# Patient Record
Sex: Female | Born: 1985 | Race: Black or African American | Hispanic: No | Marital: Single | Smoking: Former smoker
Health system: Southern US, Community
[De-identification: ages and names within clinical notes are randomized; demographics above are authoritative.]

## PROBLEM LIST (undated history)

## (undated) ENCOUNTER — Inpatient Hospital Stay (HOSPITAL_COMMUNITY): Payer: Self-pay

## (undated) DIAGNOSIS — F329 Major depressive disorder, single episode, unspecified: Secondary | ICD-10-CM

## (undated) DIAGNOSIS — R011 Cardiac murmur, unspecified: Secondary | ICD-10-CM

## (undated) DIAGNOSIS — D649 Anemia, unspecified: Secondary | ICD-10-CM

## (undated) DIAGNOSIS — A549 Gonococcal infection, unspecified: Secondary | ICD-10-CM

## (undated) DIAGNOSIS — J189 Pneumonia, unspecified organism: Secondary | ICD-10-CM

## (undated) DIAGNOSIS — N390 Urinary tract infection, site not specified: Secondary | ICD-10-CM

## (undated) DIAGNOSIS — O039 Complete or unspecified spontaneous abortion without complication: Secondary | ICD-10-CM

## (undated) DIAGNOSIS — F32A Depression, unspecified: Secondary | ICD-10-CM

---
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
## 2011-07-12 ENCOUNTER — Emergency Department (HOSPITAL_COMMUNITY)
Admission: EM | Admit: 2011-07-12 | Discharge: 2011-07-12 | Disposition: A | Discharge status: Home Health | Payer: Medicaid Other | Attending: Emergency Medicine | Admitting: Emergency Medicine

## 2011-07-12 ENCOUNTER — Encounter (HOSPITAL_COMMUNITY): Payer: Self-pay | Admitting: *Deleted

## 2011-07-12 DIAGNOSIS — R3915 Urgency of urination: Secondary | ICD-10-CM | POA: Insufficient documentation

## 2011-07-12 DIAGNOSIS — R32 Unspecified urinary incontinence: Secondary | ICD-10-CM | POA: Insufficient documentation

## 2011-07-12 DIAGNOSIS — R109 Unspecified abdominal pain: Secondary | ICD-10-CM | POA: Insufficient documentation

## 2011-07-12 DIAGNOSIS — N898 Other specified noninflammatory disorders of vagina: Secondary | ICD-10-CM | POA: Insufficient documentation

## 2011-07-12 DIAGNOSIS — L293 Anogenital pruritus, unspecified: Secondary | ICD-10-CM | POA: Insufficient documentation

## 2011-07-12 DIAGNOSIS — N390 Urinary tract infection, site not specified: Secondary | ICD-10-CM | POA: Insufficient documentation

## 2011-07-12 DIAGNOSIS — R300 Dysuria: Secondary | ICD-10-CM | POA: Insufficient documentation

## 2011-07-12 LAB — URINE MICROSCOPIC-ADD ON

## 2011-07-12 LAB — URINALYSIS, ROUTINE W REFLEX MICROSCOPIC
Glucose, UA: NEGATIVE mg/dL
Hgb urine dipstick: NEGATIVE
Specific Gravity, Urine: 1.028 (ref 1.005–1.030)
Urobilinogen, UA: 1 mg/dL (ref 0.0–1.0)

## 2011-07-12 LAB — WET PREP, GENITAL
Trich, Wet Prep: NONE SEEN
Yeast Wet Prep HPF POC: NONE SEEN

## 2011-07-12 MED ORDER — SULFAMETHOXAZOLE-TRIMETHOPRIM 800-160 MG PO TABS: 1.0000 | ORAL_TABLET | Freq: Two times a day (BID) | ORAL | Status: AC

## 2011-07-12 MED ORDER — HYDROCODONE-ACETAMINOPHEN 5-325 MG PO TABS: 1.0000 | ORAL_TABLET | ORAL | Status: AC | PRN

## 2011-07-12 NOTE — ED Provider Notes (Signed)
History     CSN: [id number]  Arrival date & time 07/12/11  1701   First MD Initiated Contact with Patient 07/12/11 1818      Chief Complaint  Patient presents with  . Vaginal Pain  . Urinary Tract Infection    (Consider location/radiation/quality/duration/timing/severity/associated sxs/prior treatment) HPI History provided by pt.   Pt has had constant, achy, mid-line lower abd pain x 4 days.  No modifying factors.  Pain feels like UTIs she has had in the past.  Associated w/ dysuria and urgency w/ incontinence as well as vulvar pruritis and vaginal discharge.  Denies fever, N/V/D and no other GU sx.  Started her period 2 days ago.  No prior h/o menstrual cramps.  No h/o abd surgeries.    History reviewed. No pertinent past medical history.  History reviewed. No pertinent past surgical history.  History reviewed. No pertinent family history.  History  Substance Use Topics  . Smoking status: Not on file  . Smokeless tobacco: Not on file  . Alcohol Use: Not on file    OB History    Grav Para Term Preterm Abortions TAB SAB Ect Mult Living                  Review of Systems  All other systems reviewed and are negative.    Allergies  Review of patient's allergies indicates no known allergies.  Home Medications  No current outpatient prescriptions on file.  BP 128/74  Pulse 79  Temp(Src) 97.5 F (36.4 C) (Oral)  Resp 20  SpO2 99%  Physical Exam  Nursing note and vitals reviewed. Constitutional: She is oriented to person, place, and time. She appears well-developed and well-nourished. No distress.       Pt does not appear uncomfortable  HENT:  Head: Normocephalic and atraumatic.  Eyes:       Normal appearance  Neck: Normal range of motion.  Cardiovascular: Normal rate and regular rhythm.   Pulmonary/Chest: Breath sounds normal.  Abdominal: Soft. Bowel sounds are normal. She exhibits no distension and no mass. There is no tenderness. There is no rebound and  no guarding.  Genitourinary: Pelvic exam was performed with patient supine. There is no rash or tenderness on the right labia. There is no rash or tenderness on the left labia. Cervix exhibits no motion tenderness and no friability. Right adnexum displays no tenderness. Left adnexum displays no tenderness. There is bleeding around the vagina. No vaginal discharge found.       Cervical os closed and appears normal.   Neurological: She is alert and oriented to person, place, and time.  Skin: Skin is warm and dry. No rash noted.  Psychiatric: She has a normal mood and affect. Her behavior is normal.    ED Course  Procedures (including critical care time)  Labs Reviewed  URINALYSIS, ROUTINE W REFLEX MICROSCOPIC - Abnormal; Notable for the following:    Leukocytes, UA SMALL (*)    All other components within normal limits  WET PREP, GENITAL - Abnormal; Notable for the following:    WBC, Wet Prep HPF POC RARE (*)    All other components within normal limits  URINE MICROSCOPIC-ADD ON - Abnormal; Notable for the following:    Squamous Epithelial / LPF FEW (*)    All other components within normal limits  POCT PREGNANCY, URINE  GC/CHLAMYDIA PROBE AMP, GENITAL   No results found.   1. Urinary tract infection       MDM  Healthy  26yo F presents w/ c/o lower abdominal pain + urinary sx and vaginal discharge.  Has her period currently but no h/o menstrual cramps.  Abd benign and non-tender and nml genitalia.  Wet prep unremarkable and U/A shows possible UTI.  Will treat w/ 3 day course of bactrim and advise pt to f/u with her gynecologist if sx have not started to improve the by the time she completes abx.  Return precautions discussed.         Otilio Miu, [place] 07/12/11 2012

## 2011-07-12 NOTE — ED Notes (Signed)
Pt in c/o burning to vaginal area and burning during urination, states she is currently on her period but is concerned about an odor, c/o odor to urine also

## 2011-07-12 NOTE — Discharge Instructions (Signed)
Take vicodin as prescribed for severe pain.   Do not drive within four hours of taking this medication (may cause drowsiness or confusion).  Take antibiotic as prescribed.  You can buy azo standard over the counter for burning when you urinate.  Follow up with your gynecologist, particularly if the pain has not started to improve by the time you complete your antibiotic.  If you do not have a gynecologist, you can call [facility] [phone]; [address])  You should return to the ER if your pain worsens or you develop associated fever.  Urinary Tract Infection Infections of the urinary tract can start in several places. A bladder infection (cystitis), a kidney infection (pyelonephritis), and a prostate infection (prostatitis) are different types of urinary tract infections (UTIs). They usually get better if treated with medicines (antibiotics) that kill germs. Take all the medicine until it is gone. You or your child may feel better in a few days, but TAKE ALL MEDICINE or the infection may not respond and may become more difficult to treat. HOME CARE INSTRUCTIONS   Drink enough water and fluids to keep the urine clear or pale yellow. Cranberry juice is especially recommended, in addition to large amounts of water.   Avoid caffeine, tea, and carbonated beverages. They tend to irritate the bladder.   Alcohol may irritate the prostate.   Only take over-the-counter or prescription medicines for pain, discomfort, or fever as directed by your caregiver.  To prevent further infections:  Empty the bladder often. Avoid holding urine for long periods of time.   After a bowel movement, women should cleanse from front to back. Use each tissue only once.   Empty the bladder before and after sexual intercourse.  FINDING OUT THE RESULTS OF YOUR TEST Not all test results are available during your visit. If your or your child's test results are not back during the visit, make an appointment  with your caregiver to find out the results. Do not assume everything is normal if you have not heard from your caregiver or the medical facility. It is important for you to follow up on all test results. SEEK MEDICAL CARE IF:   There is back pain.   Your baby is older than 3 months with a rectal temperature of 100.5 F (38.1 C) or higher for more than 1 day.   Your or your child's problems (symptoms) are no better in 3 days. Return sooner if you or your child is getting worse.  SEEK IMMEDIATE MEDICAL CARE IF:   There is severe back pain or lower abdominal pain.   You or your child develops chills.   You have a fever.   Your baby is older than 3 months with a rectal temperature of 102 F (38.9 C) or higher.   Your baby is 43 months old or younger with a rectal temperature of 100.4 F (38 C) or higher.   There is nausea or vomiting.   There is continued burning or discomfort with urination.  MAKE SURE YOU:   Understand these instructions.   Will watch your condition.   Will get help right away if you are not doing well or get worse.  Document Released: 02/13/2005 Document Revised: 01/16/2011 Document Reviewed: 09/18/2006 [facility] Patient Information 2012 [place], [place].

## 2011-07-13 LAB — GC/CHLAMYDIA PROBE AMP, GENITAL: GC Probe Amp, Genital: NEGATIVE

## 2011-07-13 NOTE — ED Provider Notes (Signed)
Medical screening examination/treatment/procedure(s) were performed by non-physician practitioner and as supervising physician I was immediately available for consultation/collaboration.  Flint Melter, MD 07/13/11 [phone]

## 2011-08-20 ENCOUNTER — Emergency Department (HOSPITAL_COMMUNITY): Payer: Medicaid Other

## 2011-08-20 ENCOUNTER — Encounter (HOSPITAL_COMMUNITY): Payer: Self-pay | Admitting: Emergency Medicine

## 2011-08-20 ENCOUNTER — Emergency Department (HOSPITAL_COMMUNITY)
Admission: EM | Admit: 2011-08-20 | Discharge: 2011-08-20 | Disposition: A | Discharge status: Home / Self Care | Payer: Medicaid Other | Attending: Emergency Medicine | Admitting: Emergency Medicine

## 2011-08-20 DIAGNOSIS — O99891 Other specified diseases and conditions complicating pregnancy: Secondary | ICD-10-CM | POA: Insufficient documentation

## 2011-08-20 DIAGNOSIS — R109 Unspecified abdominal pain: Secondary | ICD-10-CM | POA: Insufficient documentation

## 2011-08-20 DIAGNOSIS — M545 Low back pain, unspecified: Secondary | ICD-10-CM | POA: Insufficient documentation

## 2011-08-20 MED ORDER — ACETAMINOPHEN 325 MG PO TABS
650.0000 mg | ORAL_TABLET | Freq: Once | ORAL | Status: AC
  Administered 2011-08-20: 650 mg via ORAL
  Filled 2011-08-20: qty 2
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
## 2011-08-21 NOTE — ED Provider Notes (Signed)
Medical screening examination/treatment/procedure(s) were performed by non-physician practitioner and as supervising physician I was immediately available for consultation/collaboration.   Chrystina Naff, MD 08/21/11 2103 

## 2012-01-30 ENCOUNTER — Encounter (HOSPITAL_COMMUNITY): Payer: Self-pay

## 2012-01-30 ENCOUNTER — Inpatient Hospital Stay (HOSPITAL_COMMUNITY)
Admission: AD | Admit: 2012-01-30 | Discharge: 2012-01-30 | Disposition: A | Discharge status: Home / Self Care | Payer: Medicaid Other | Source: Ambulatory Visit | Attending: Obstetrics & Gynecology | Admitting: Obstetrics & Gynecology

## 2012-01-30 DIAGNOSIS — J069 Acute upper respiratory infection, unspecified: Secondary | ICD-10-CM

## 2012-01-30 DIAGNOSIS — O0930 Supervision of pregnancy with insufficient antenatal care, unspecified trimester: Secondary | ICD-10-CM | POA: Insufficient documentation

## 2012-01-30 DIAGNOSIS — O99891 Other specified diseases and conditions complicating pregnancy: Secondary | ICD-10-CM | POA: Insufficient documentation

## 2012-01-30 DIAGNOSIS — J00 Acute nasopharyngitis [common cold]: Secondary | ICD-10-CM | POA: Insufficient documentation

## 2012-01-30 DIAGNOSIS — B373 Candidiasis of vulva and vagina: Secondary | ICD-10-CM

## 2012-01-30 HISTORY — DX: Cardiac murmur, unspecified: R01.1

## 2012-01-30 HISTORY — DX: Anemia, unspecified: D64.9

## 2012-01-30 LAB — WET PREP, GENITAL
Clue Cells Wet Prep HPF POC: NONE SEEN
Trich, Wet Prep: NONE SEEN

## 2012-01-30 MED ORDER — PSEUDOEPHEDRINE HCL 30 MG PO TABS
60.0000 mg | ORAL_TABLET | Freq: Once | ORAL | Status: AC
  Administered 2012-01-30: 60 mg via ORAL
  Filled 2012-01-30: qty 2

## 2012-01-30 MED ORDER — PSEUDOEPHEDRINE HCL 30 MG PO TABS
30.0000 mg | ORAL_TABLET | Freq: Once | ORAL | Status: AC
  Administered 2012-01-30: 30 mg via ORAL
  Filled 2012-01-30: qty 1

## 2012-01-30 MED ORDER — FLUCONAZOLE 150 MG PO TABS
150.0000 mg | ORAL_TABLET | Freq: Once | ORAL | Status: AC
  Administered 2012-01-30: 150 mg via ORAL
  Filled 2012-01-30: qty 1

## 2012-01-30 MED ORDER — FLUCONAZOLE 150 MG PO TABS: 150.0000 mg | ORAL_TABLET | Freq: Once | ORAL | Status: DC

## 2012-01-30 MED ORDER — FLUCONAZOLE 150 MG PO TABS: 150.0000 mg | ORAL_TABLET | Freq: Once | ORAL | Status: AC

## 2012-01-30 NOTE — MAU Provider Note (Signed)
  History     CSN: [id number]  Arrival date and time: 01/30/12 1722   First Provider Initiated Contact with Patient 01/30/12 1858      Chief Complaint  Patient presents with  . URI  . Vaginal Discharge  . Abdominal Pain   HPI This is a 26 y.o. female at [redacted]w[redacted]d who presents with c/o cold symptoms, dizziness, and cough which gives her substernal pain at times. Duration 3 days. Cough is productive in mornings. No fever. No leaking or bleeding. C/O vaginal itching and occ. Odor. Gets care in [place] but lives here now and may transfer. No care in past month OB History    Grav Para Term Preterm Abortions TAB SAB Ect Mult Living   6 5 4 1      5       Past Medical History  Diagnosis Date  . Heart murmur   . Anemia     Past Surgical History  Procedure Date  . Cesarean section     Family History  Problem Relation Age of Onset  . Other Neg Hx     History  Substance Use Topics  . Smoking status: Former Smoker    Quit date: 12/30/2011  . Smokeless tobacco: Not on file  . Alcohol Use: No     smokes a joint occassionally    Allergies: No Known Allergies  No prescriptions prior to admission    ROS As in HPI  Physical Exam   Blood pressure 120/67, pulse 94, temperature 98.4 F (36.9 C), temperature source Oral, resp. rate 16, height 5\' 8"  (1.727 m), last menstrual period 07/09/2011, SpO2 100.00%.  Physical Exam  Constitutional: She is oriented to person, place, and time. She appears well-developed and well-nourished. No distress.  HENT:  Head: Normocephalic.  Right Ear: External ear normal.  Left Ear: External ear normal.  Mouth/Throat: Oropharynx is clear and moist. No oropharyngeal exudate.  Cardiovascular: Normal rate.   Respiratory: Effort normal and breath sounds normal. No respiratory distress. She has no wheezes. She has no rales. She exhibits no tenderness.  GI: Soft. There is no tenderness.  Genitourinary: Uterus normal. Vaginal discharge (small  white) found.  Musculoskeletal: Normal range of motion.  Neurological: She is alert and oriented to person, place, and time.  Skin: Skin is warm and dry.  Psychiatric: She has a normal mood and affect.  FHR reassuring No contractions  MAU Course  Procedures   Assessment and Plan  A:  SIUP at [redacted]w[redacted]d      Common cold with productive cough      No fever      Limited prenatal care  P:  Sudafed and mucinex (will give one dose here)      Supportive care      Wet prep pending   [facility] [place] 01/30/2012, 7:20 PM

## 2012-01-30 NOTE — MAU Note (Signed)
Patient states she gets her prenatal care at [facility]. Has been having symptoms of a cold for about 3 days. Nasal congestion, coughing and chest tightening when walking. Denies any bleeding or leaking. Reports good fetal movement.

## 2012-01-30 NOTE — MAU Provider Note (Signed)
Attestation of Attending Supervision of Advanced Practitioner (CNM/NP): Evaluation and management procedures were performed by the Advanced Practitioner under my supervision and collaboration.  I have reviewed the Advanced Practitioner's note and chart, and I agree with the management and plan.  Darrius Montano, MD, FACOG Attending Obstetrician & Gynecologist Faculty Practice, [facility]  

## 2012-02-17 ENCOUNTER — Encounter (HOSPITAL_COMMUNITY): Payer: Self-pay | Admitting: *Deleted

## 2012-02-17 ENCOUNTER — Inpatient Hospital Stay (HOSPITAL_COMMUNITY)
Admission: AD | Admit: 2012-02-17 | Discharge: 2012-02-17 | Disposition: A | Discharge status: Home / Self Care | Payer: Medicaid Other | Source: Ambulatory Visit | Attending: Obstetrics & Gynecology | Admitting: Obstetrics & Gynecology

## 2012-02-17 DIAGNOSIS — E860 Dehydration: Secondary | ICD-10-CM

## 2012-02-17 DIAGNOSIS — O479 False labor, unspecified: Secondary | ICD-10-CM

## 2012-02-17 DIAGNOSIS — R0602 Shortness of breath: Secondary | ICD-10-CM | POA: Insufficient documentation

## 2012-02-17 DIAGNOSIS — R109 Unspecified abdominal pain: Secondary | ICD-10-CM | POA: Insufficient documentation

## 2012-02-17 DIAGNOSIS — O4700 False labor before 37 completed weeks of gestation, unspecified trimester: Secondary | ICD-10-CM | POA: Insufficient documentation

## 2012-02-17 DIAGNOSIS — O99891 Other specified diseases and conditions complicating pregnancy: Secondary | ICD-10-CM | POA: Insufficient documentation

## 2012-02-17 DIAGNOSIS — R002 Palpitations: Secondary | ICD-10-CM | POA: Insufficient documentation

## 2012-02-17 HISTORY — DX: Urinary tract infection, site not specified: N39.0

## 2012-02-17 HISTORY — DX: Gonococcal infection, unspecified: A54.9

## 2012-02-17 HISTORY — DX: Major depressive disorder, single episode, unspecified: F32.9

## 2012-02-17 HISTORY — DX: Depression, unspecified: F32.A

## 2012-02-17 LAB — URINALYSIS, ROUTINE W REFLEX MICROSCOPIC
Hgb urine dipstick: NEGATIVE
Ketones, ur: 40 mg/dL — AB
Nitrite: NEGATIVE
Protein, ur: 30 mg/dL — AB
Specific Gravity, Urine: >1.03 — ABNORMAL HIGH (ref 1.005–1.030)
Urobilinogen, UA: 1 mg/dL (ref 0.0–1.0)

## 2012-02-17 LAB — URINE MICROSCOPIC-ADD ON

## 2012-02-17 MED ORDER — SODIUM CHLORIDE 0.9 % IV BOLUS (SEPSIS)
1000.0000 mL | Freq: Once | INTRAVENOUS | Status: AC
  Administered 2012-02-17: 1000 mL via INTRAVENOUS

## 2012-02-17 MED ORDER — ACETAMINOPHEN 325 MG PO TABS
650.0000 mg | ORAL_TABLET | ORAL | Status: AC
  Administered 2012-02-17: 650 mg via ORAL
  Filled 2012-02-17: qty 2

## 2012-02-17 MED ORDER — DEXTROSE 5 % IN LACTATED RINGERS IV BOLUS
1000.0000 mL | Freq: Once | INTRAVENOUS | Status: AC
  Administered 2012-02-17: 1000 mL via INTRAVENOUS

## 2012-02-17 NOTE — MAU Provider Note (Signed)
History     CSN: [id number]  Arrival date and time: 02/17/12 5284   First Provider Initiated Contact with Patient 02/17/12 1908      Chief Complaint  Patient presents with  . Abdominal Cramping  . R/O contractions    HPI Comments: 26 yo [id number] @ 31.6 weeks with history of 4 C/S's and no prenatal care presents with heart palpitations, SOB, dizziness, and abdominal tightening.  Patient reports that for the past 2 weeks she has felt like her heart is racin/gpounding, especially when going from sitting to standing.  This is associated with shortness of breath and some dizziness.  No history of cardiac or pulmonary problems.  Has also been having some lower abdominal tightening and feels like her daughter is trying to kick her water out.  Some vaginal pressure but feels like it is more associated with fetal movement.  Having occasional ctx (whole stomach tightening) ~4x/day.  Has been unable to get any [facility] b/c she went to [facility] for her last pregnancies but is unable to get to [facility] b/c she is without a vehicle currently.  Denies LOF or vaginal bleeding. +FM.   Abdominal Cramping Associated symptoms include nausea. Pertinent negatives include no dysuria, fever, headaches or vomiting.    OB History    Grav Para Term Preterm Abortions TAB SAB Ect Mult Living   6 5 4 1      5       Past Medical History  Diagnosis Date  . Heart murmur   . Anemia   . Urinary tract infection   . Depression   . Gonorrhea     Past Surgical History  Procedure Date  . Cesarean section     X 4    Family History  Problem Relation Age of Onset  . Other Neg Hx     History  Substance Use Topics  . Smoking status: Former Smoker    Quit date: 12/30/2011  . Smokeless tobacco: Never Used  . Alcohol Use: No     smokes a joint occassionally    Allergies: No Known Allergies  No prescriptions prior to admission    Review of Systems  Constitutional: Negative for fever and chills.  Eyes: Negative  for blurred vision.  Respiratory: Positive for shortness of breath. Negative for cough and wheezing.   Cardiovascular: Negative for chest pain.  Gastrointestinal: Positive for nausea and abdominal pain. Negative for vomiting.  Genitourinary: Negative for dysuria.  Musculoskeletal: Negative for falls.  Neurological: Positive for dizziness. Negative for loss of consciousness and headaches.   Physical Exam   Blood pressure 123/69, pulse 114, temperature 98.1 F (36.7 C), temperature source Oral, resp. rate 18, height 5\' 8"  (1.727 m), weight 122.925 kg (271 lb), last menstrual period 07/09/2011, SpO2 99.00%.  Physical Exam  Constitutional: She is oriented to person, place, and time. She appears well-developed and well-nourished. No distress.  HENT:  Head: Normocephalic and atraumatic.  Mouth/Throat: Mucous membranes are dry.  Eyes: Conjunctivae normal are normal. Pupils are equal, round, and reactive to light.  Cardiovascular: Regular rhythm, normal heart sounds and intact distal pulses.  Tachycardia present.   No murmur heard. Respiratory: Effort normal and breath sounds normal. No respiratory distress. She has no wheezes.  GI: Soft. There is no tenderness.       Gravid  Musculoskeletal: She exhibits no edema.  Neurological: She is alert and oriented to person, place, and time.  Skin: Skin is warm and dry. She is not diaphoretic.  FHT: 150's, mod var, + accls, no decels Toco: mild irritability, no ctx   MAU Course  Procedures  MDM Pt appears dehydrated, which could cause constellation of symptoms. Will give IVF (1L NS bolus) and monitor if pt's HR improves.  Pt with improved HR after 1st liter. Will hang 2nd liter.  Assessment and Plan  26 yo [phone] @ 31.6 weeks p/w dehydration -improved s/p 1L NS  Further care signed out to Sharen Counter  [facility] 02/17/2012, 7:08 PM   Results for orders placed during the hospital encounter of 02/17/12 (from the past 24  hour(s))  URINALYSIS, ROUTINE W REFLEX MICROSCOPIC     Status: Abnormal   Collection Time   02/17/12  6:32 PM      Component Value Range   Color, Urine AMBER (*) YELLOW   APPearance CLEAR  CLEAR   Specific Gravity, Urine >1.030 (*) 1.005 - 1.030   pH 6.5  5.0 - 8.0   Glucose, UA NEGATIVE  NEGATIVE mg/dL   Hgb urine dipstick NEGATIVE  NEGATIVE   Bilirubin Urine SMALL (*) NEGATIVE   Ketones, ur 40 (*) NEGATIVE mg/dL   Protein, ur 30 (*) NEGATIVE mg/dL   Urobilinogen, UA 1.0  0.0 - 1.0 mg/dL   Nitrite NEGATIVE  NEGATIVE   Leukocytes, UA NEGATIVE  NEGATIVE  URINE MICROSCOPIC-ADD ON     Status: Abnormal   Collection Time   02/17/12  6:32 PM      Component Value Range   Squamous Epithelial / LPF FEW (*) RARE   WBC, UA 3-6  <3 WBC/hpf   RBC / HPF 0-2  <3 RBC/hpf   Bacteria, UA MANY (*) RARE   Urine-Other MUCOUS PRESENT     Dilation: Closed Effacement (%): Thick Exam by:: Dr. Fara Boros   A: Dehydration Braxton-Hicks Contractions  P: Tylenol given for mild h/a Pt abdominal symptoms resolved completely after second bag of IV fluid  D/C home with PTL precautions Message sent to [facility] for pt to begin care Drink plenty of fluids Return to MAU as needed   Sharen Counter Certified Nurse-Midwife

## 2012-02-17 NOTE — MAU Note (Addendum)
States she does not feel well. States for about 2 weeks, she has felt weak in her legs, has pressure in back and lower abdomen. Feels off and on that her heart is racing and she will feel dizzy during that time. Worse with certain movements. States she gets her prenatal care @ [facility]. Last appointment was in July. Does not have a car right now. States she has had 4 C/S's. 2nd child was 6 wks early and was a vaginal delivery.

## 2012-02-18 NOTE — MAU Provider Note (Signed)
Attestation of Attending Supervision of Advanced Practitioner (CNM/NP): Evaluation and management procedures were performed by the Advanced Practitioner under my supervision and collaboration.  I have reviewed the Advanced Practitioner's note and chart, and I agree with the management and plan.  Mykal Kirchman, MD, FACOG Attending Obstetrician & Gynecologist Faculty Practice, [facility]  

## 2012-02-21 ENCOUNTER — Telehealth: Payer: Self-pay

## 2012-02-21 NOTE — Telephone Encounter (Signed)
Pt called and stated that she just came from the ER the other day and has first appt scheduled with [place] on Monday for prenatal appt and I have questions.  Could someone please call back.  Called pt and pt informed me that she is feeling bad with cold like symptoms-cough, mucous.  She stated that she started feeling bad after leaving the hospital and that she sometimes has headaches.  I advised pt that it is safe to take Tylenol during pregnancy for her headache and for her cold symptoms I gave her- Sudafed (pt does not have hypertension), Robitussin Plain, Tylenol Sinus.  Pt stated understanding and stated that she would be here on Monday 02/24/12 for her prenatal appt.

## 2012-02-24 ENCOUNTER — Encounter: Payer: Medicaid Other | Admitting: Family Medicine

## 2012-03-02 ENCOUNTER — Encounter: Payer: Medicaid Other | Admitting: Family Medicine

## 2012-05-02 ENCOUNTER — Encounter (HOSPITAL_COMMUNITY): Payer: Self-pay | Admitting: *Deleted

## 2012-05-02 ENCOUNTER — Inpatient Hospital Stay (HOSPITAL_COMMUNITY)
Admission: AD | Admit: 2012-05-02 | Discharge: 2012-05-02 | Disposition: A | Discharge status: Home / Self Care | Payer: Medicaid Other | Source: Ambulatory Visit | Attending: Obstetrics and Gynecology | Admitting: Obstetrics and Gynecology

## 2012-05-02 DIAGNOSIS — N390 Urinary tract infection, site not specified: Secondary | ICD-10-CM | POA: Insufficient documentation

## 2012-05-02 DIAGNOSIS — R109 Unspecified abdominal pain: Secondary | ICD-10-CM | POA: Insufficient documentation

## 2012-05-02 DIAGNOSIS — O2390 Unspecified genitourinary tract infection in pregnancy, unspecified trimester: Secondary | ICD-10-CM

## 2012-05-02 DIAGNOSIS — R350 Frequency of micturition: Secondary | ICD-10-CM | POA: Insufficient documentation

## 2012-05-02 DIAGNOSIS — O8620 Urinary tract infection following delivery, unspecified: Secondary | ICD-10-CM

## 2012-05-02 LAB — URINE MICROSCOPIC-ADD ON

## 2012-05-02 LAB — URINALYSIS, ROUTINE W REFLEX MICROSCOPIC
Glucose, UA: NEGATIVE mg/dL
Ketones, ur: NEGATIVE mg/dL
Nitrite: NEGATIVE
Specific Gravity, Urine: 1.02 (ref 1.005–1.030)
pH: 6 (ref 5.0–8.0)

## 2012-05-02 MED ORDER — CEPHALEXIN 500 MG PO CAPS: 500.0000 mg | ORAL_CAPSULE | Freq: Four times a day (QID) | ORAL | Status: AC

## 2012-05-02 NOTE — MAU Note (Signed)
Pt c/o burining with urination about 3 days ago and c/o left flank pain off and on urine has a foul odor as well. Post partum 3 week after c-section

## 2012-05-02 NOTE — MAU Note (Signed)
"  I had a c/s on 04/07/12.  I had some abd pain that I thought was gas.  I went to the [facility] and had a lot of burning and a foul smell with urination.  I stopped drinking soda thinking that would help.  I started drinking more water, but that didn't help.  I am still having burning with urination; my pee hole is sore.  My belly is achy.  Everything started 4 days ago."

## 2012-05-02 NOTE — MAU Provider Note (Signed)
  History     CSN: [id number]  Arrival date and time: 05/02/12 1215   First Provider Initiated Contact with Patient 05/02/12 1325      Chief Complaint  Patient presents with  . Urinary Tract Infection   HPI Heather Lynch is a 26 y.o. [phone] female who presents s/p c/s @ [facility] on 11/19, w/ report of urinary frequency, urgency, hesitancy, low abdominal/pelvic pressure, burning with urination, and foul smelling urine x 1 week.  Denies fever, chills, flank pain.  Breastfeeding infant.     OB History    Grav Para Term Preterm Abortions TAB SAB Ect Mult Living   6 6 5 1      6       Past Medical History  Diagnosis Date  . Heart murmur   . Anemia   . Urinary tract infection   . Gonorrhea   . Depression     refuses to take meds    Past Surgical History  Procedure Date  . Cesarean section     X 5    Family History  Problem Relation Age of Onset  . Other Neg Hx     History  Substance Use Topics  . Smoking status: Former Smoker    Quit date: 12/30/2011  . Smokeless tobacco: Never Used  . Alcohol Use: No    Allergies: No Known Allergies  Prescriptions prior to admission  Medication Sig Dispense Refill  . oxyCODONE-acetaminophen (PERCOCET/ROXICET) 5-325 MG per tablet Take 1 tablet by mouth every 8 (eight) hours as needed. For back pain        Review of Systems  Constitutional: Negative for fever and chills.  HENT: Negative.   Eyes: Negative.   Respiratory: Negative.   Cardiovascular: Negative.   Gastrointestinal: Positive for abdominal pain (low abd/pelvic pressure).  Genitourinary: Positive for dysuria, urgency and frequency. Negative for hematuria and flank pain.  Musculoskeletal: Negative.   Skin: Negative.   Neurological: Negative.   Endo/Heme/Allergies: Negative.   Psychiatric/Behavioral: Negative.    Physical Exam   Blood pressure 128/72, pulse 78, temperature 98.3 F (36.8 C), temperature source Oral, resp. rate 18, height 5\' 8"  (1.727 m),  weight 115.939 kg (255 lb 9.6 oz), last menstrual period 07/09/2011, currently breastfeeding.  Physical Exam  Constitutional: She is oriented to person, place, and time. She appears well-developed and well-nourished.  HENT:  Head: Normocephalic.  Neck: Normal range of motion.  Cardiovascular: Normal rate.   Respiratory: Effort normal.  GI: Soft. She exhibits no distension. There is no tenderness. There is no CVA tenderness.       No suprapubic tenderness to palpation   Musculoskeletal: Normal range of motion.  Neurological: She is alert and oriented to person, place, and time.  Skin: Skin is warm and dry.  Psychiatric: She has a normal mood and affect. Her behavior is normal. Judgment and thought content normal.    MAU Course  Procedures  UA, C&S  Assessment and Plan  A:  3.5wks s/p C/S  UTI  Breastfeeding  P:  D/C home  Rx Keflex 500mg  QID x 7d  Push po fluids, esp water and cranberry juice  Call [facility] on Monday to schedule appt to be seen this week as previously planned  List of local providers given per pt request  Marge Duncans 05/02/2012, 1:48 PM

## 2012-05-05 LAB — URINE CULTURE

## 2012-05-05 NOTE — MAU Provider Note (Signed)
Attestation of Attending Supervision of Advanced Practitioner (CNM/NP): Evaluation and management procedures were performed by the Advanced Practitioner under my supervision and collaboration.  I have reviewed the Advanced Practitioner's note and chart, and I agree with the management and plan.  Koby Pickup 05/05/2012 5:43 PM

## 2013-02-07 ENCOUNTER — Emergency Department (HOSPITAL_COMMUNITY)
Admission: EM | Admit: 2013-02-07 | Discharge: 2013-02-07 | Disposition: A | Discharge status: Home / Self Care | Payer: Medicaid Other | Attending: Emergency Medicine | Admitting: Emergency Medicine

## 2013-02-07 ENCOUNTER — Encounter (HOSPITAL_COMMUNITY): Payer: Self-pay | Admitting: Emergency Medicine

## 2013-02-07 DIAGNOSIS — R011 Cardiac murmur, unspecified: Secondary | ICD-10-CM | POA: Insufficient documentation

## 2013-02-07 DIAGNOSIS — Z862 Personal history of diseases of the blood and blood-forming organs and certain disorders involving the immune mechanism: Secondary | ICD-10-CM | POA: Insufficient documentation

## 2013-02-07 DIAGNOSIS — F329 Major depressive disorder, single episode, unspecified: Secondary | ICD-10-CM | POA: Insufficient documentation

## 2013-02-07 DIAGNOSIS — K0889 Other specified disorders of teeth and supporting structures: Secondary | ICD-10-CM

## 2013-02-07 DIAGNOSIS — K089 Disorder of teeth and supporting structures, unspecified: Secondary | ICD-10-CM | POA: Insufficient documentation

## 2013-02-07 DIAGNOSIS — Z87440 Personal history of urinary (tract) infections: Secondary | ICD-10-CM | POA: Insufficient documentation

## 2013-02-07 DIAGNOSIS — R209 Unspecified disturbances of skin sensation: Secondary | ICD-10-CM | POA: Insufficient documentation

## 2013-02-07 DIAGNOSIS — Z8619 Personal history of other infectious and parasitic diseases: Secondary | ICD-10-CM | POA: Insufficient documentation

## 2013-02-07 DIAGNOSIS — Z87891 Personal history of nicotine dependence: Secondary | ICD-10-CM | POA: Insufficient documentation

## 2013-02-07 DIAGNOSIS — F3289 Other specified depressive episodes: Secondary | ICD-10-CM | POA: Insufficient documentation

## 2013-02-07 MED ORDER — OXYCODONE-ACETAMINOPHEN 5-325 MG PO TABS: 1.0000 | ORAL_TABLET | Freq: Three times a day (TID) | ORAL | Status: DC | PRN

## 2013-02-07 MED ORDER — PENICILLIN V POTASSIUM 500 MG PO TABS: 500.0000 mg | ORAL_TABLET | Freq: Four times a day (QID) | ORAL | Status: AC

## 2013-02-07 MED ORDER — PENICILLIN V POTASSIUM 500 MG PO TABS
500.0000 mg | ORAL_TABLET | Freq: Once | ORAL | Status: AC
  Administered 2013-02-07: 500 mg via ORAL
  Filled 2013-02-07: qty 1

## 2013-02-07 NOTE — ED Provider Notes (Signed)
Medical screening examination/treatment/procedure(s) were performed by non-physician practitioner and as supervising physician I was immediately available for consultation/collaboration.  Sunnie Nielsen, MD 02/07/13 [phone]

## 2013-02-07 NOTE — ED Notes (Signed)
Pt c/o pain to L lower jaw, pt states she does have broken tooth with a foul odor when she flosses.

## 2013-02-07 NOTE — ED Provider Notes (Signed)
CSN: [id number]     Arrival date & time 02/07/13  1478 History   First MD Initiated Contact with Patient 02/07/13 [phone]     Chief Complaint  Patient presents with  . Dental Pain   (Consider location/radiation/quality/duration/timing/severity/associated sxs/prior Treatment) The history is provided by the patient. No language interpreter was used.  Shawnay Bramel is a 27 year old female with past medical history of heart murmur, depression presenting to emergency department with dental pain that has been ongoing for one month. Patient reports that she cracked her tooth approximately one month ago while eating, patient cannot recall when. Patient stated that the pain was a dull aching sensation over the past month with exacerbation that occurred yesterday at approximately 9:00 PM-patient reported that he feels as if someone is "taking a screw and going into it." Patient reports that she has pain radiating to her temples on the left side and her ear that is described as an aching sensation. Patient reports that sweets make the pain worse while warm water makes the pain better. Patient reports that she's been using Tylenol and gargling with warm water and salt/peroxide with negative relief. Denied pus drainage, bleeding, neck stiffness, fevers, chills, difficulty swallowing, sore throat, chest pain, shortness of breath, difficulty breathing. PCP none  Past Medical History  Diagnosis Date  . Heart murmur   . Anemia   . Urinary tract infection   . Gonorrhea   . Depression     refuses to take meds   Past Surgical History  Procedure Laterality Date  . Cesarean section      X 5   Family History  Problem Relation Age of Onset  . Other Neg Hx    History  Substance Use Topics  . Smoking status: Former Smoker    Quit date: 12/30/2011  . Smokeless tobacco: Never Used  . Alcohol Use: Yes     Comment: occasional   OB History   Grav Para Term Preterm Abortions TAB SAB Ect Mult Living   6 6 5  1      6      Review of Systems  Constitutional: Negative for fever and chills.  HENT: Positive for dental problem. Negative for sore throat, trouble swallowing, neck pain and neck stiffness.   Respiratory: Negative for chest tightness and shortness of breath.   Cardiovascular: Negative for chest pain.  Neurological: Positive for headaches.  All other systems reviewed and are negative.    Allergies  Review of patient's allergies indicates no known allergies.  Home Medications   Current Outpatient Rx  Name  Route  Sig  Dispense  Refill  . acetaminophen (TYLENOL) 500 MG tablet   Oral   Take 1,000 mg by mouth every 6 (six) hours as needed for pain.         Marland Kitchen oxyCODONE-acetaminophen (PERCOCET/ROXICET) 5-325 MG per tablet   Oral   Take 1 tablet by mouth every 8 (eight) hours as needed for pain.   11 tablet   0   . penicillin v potassium (VEETID) 500 MG tablet   Oral   Take 1 tablet (500 mg total) by mouth 4 (four) times daily.   40 tablet   0    BP 134/80  Pulse 93  Temp(Src) 98.7 F (37.1 C) (Oral)  Resp 18  Ht 5\' 8"  (1.727 m)  Wt 253 lb (114.76 kg)  BMI 38.48 kg/m2  SpO2 100%  LMP 02/04/2013  Breastfeeding? No Physical Exam  Nursing note and vitals reviewed. Constitutional: She  appears well-developed and well-nourished. No distress.  HENT:  Head: Normocephalic and atraumatic.  Right Ear: External ear normal.  Left Ear: External ear normal.  Mouth/Throat: Oropharynx is clear and moist. No oropharyngeal exudate.    Negative facial swelling, erythema, asymmetry identified. Negative swelling, erythema, inflammation, lesions, sores identified to the buccal mucosa and maxillary and mandibular gum lines. Negative active drainage of blood or pus identified. Negative cysts, negative abscesses. Negative trismus. Negative signs of peritonsillar abscess-uvula midline, such elevation. Negative sublingual lesions. Diagrammed second molar of the mandibular jaw the left  side. Mild decaying process identified. Pain upon palpation. Negative swelling or abscess formation to the gumline.  Eyes: Conjunctivae and EOM are normal. Pupils are equal, round, and reactive to light. Right eye exhibits no discharge. Left eye exhibits no discharge.  Neck: Normal range of motion. Neck supple.  Negative neck stiffness Negative nuchal rigidity Negative lymphadenopathy  Cardiovascular: Normal rate.  Exam reveals no friction rub.   Murmur heard. Pulses:      Radial pulses are 2+ on the right side, and 2+ on the left side.  Pulmonary/Chest: Effort normal and breath sounds normal. No respiratory distress. She has no wheezes. She has no rales.  Lymphadenopathy:    She has no cervical adenopathy.  Skin: Skin is warm and dry. No rash noted. She is not diaphoretic. No erythema.  Psychiatric: She has a normal mood and affect. Her behavior is normal. Thought content normal.    ED Course  Procedures (including critical care time)  Medications  penicillin v potassium (VEETID) tablet 500 mg (500 mg Oral Given 02/07/13 0725)   Labs Review Labs Reviewed - No data to display Imaging Review No results found.  MDM   1. Pain, dental     Patient presenting to emergency department with dental pain that has been ongoing for a month with exacerbation that occurred last night. Fever, chills, neck stiffness. Alert and oriented. Pleasant upon examination. Negative facial swelling, erythema, inflammation. Negative lymphadenopathy identified. Diagrammed second molar of the mandibular jaw on the left side identified-pain upon palpation-negative signs of abscesses or cyst formation-in process of decay. Negative trismus identified. Negative signs of peritonsillar abscess. Negative Ludwig's angina. Dental pain secondary to diagrammed tooth and affecting the nerve root. One dose of antibiotics given in ED setting. Discharged patient with antibiotics and pain medications. Discussed with patient  course, precautions, disposal techniques of the pain medications. Discussed with patient to rest and stay hydrated. Discussed with patient to apply ice and warm compressions to the side of the face. Discussed with patient to continue to monitor symptoms and if symptoms are to worsen or change report back to emergency department - strict return instructions given. Patient agreed to plan of care, understood, all questions answered.    Raymon Mutton, PA-C 02/07/13 1719

## 2013-03-31 ENCOUNTER — Emergency Department (HOSPITAL_COMMUNITY)
Admission: EM | Admit: 2013-03-31 | Discharge: 2013-03-31 | Disposition: A | Discharge status: Home / Self Care | Payer: Medicaid Other | Attending: Emergency Medicine | Admitting: Emergency Medicine

## 2013-03-31 ENCOUNTER — Encounter (HOSPITAL_COMMUNITY): Payer: Self-pay | Admitting: Emergency Medicine

## 2013-03-31 DIAGNOSIS — Z8619 Personal history of other infectious and parasitic diseases: Secondary | ICD-10-CM | POA: Insufficient documentation

## 2013-03-31 DIAGNOSIS — Z87891 Personal history of nicotine dependence: Secondary | ICD-10-CM | POA: Insufficient documentation

## 2013-03-31 DIAGNOSIS — Z87440 Personal history of urinary (tract) infections: Secondary | ICD-10-CM | POA: Insufficient documentation

## 2013-03-31 DIAGNOSIS — Z862 Personal history of diseases of the blood and blood-forming organs and certain disorders involving the immune mechanism: Secondary | ICD-10-CM | POA: Insufficient documentation

## 2013-03-31 DIAGNOSIS — R011 Cardiac murmur, unspecified: Secondary | ICD-10-CM | POA: Insufficient documentation

## 2013-03-31 DIAGNOSIS — Z8659 Personal history of other mental and behavioral disorders: Secondary | ICD-10-CM | POA: Insufficient documentation

## 2013-03-31 DIAGNOSIS — K029 Dental caries, unspecified: Secondary | ICD-10-CM | POA: Insufficient documentation

## 2013-03-31 MED ORDER — PENICILLIN V POTASSIUM 500 MG PO TABS: 1000.0000 mg | ORAL_TABLET | Freq: Two times a day (BID) | ORAL | Status: DC

## 2013-03-31 MED ORDER — HYDROCODONE-ACETAMINOPHEN 5-325 MG PO TABS
2.0000 | ORAL_TABLET | Freq: Once | ORAL | Status: AC
  Administered 2013-03-31: 2 via ORAL
  Filled 2013-03-31: qty 2

## 2013-03-31 MED ORDER — HYDROCODONE-ACETAMINOPHEN 5-325 MG PO TABS: 2.0000 | ORAL_TABLET | Freq: Four times a day (QID) | ORAL | Status: DC | PRN

## 2013-03-31 NOTE — ED Provider Notes (Signed)
CSN: [id number]     Arrival date & time 03/31/13  1546 History   First MD Initiated Contact with Patient 03/31/13 1633     Chief Complaint  Patient presents with  . Dental Pain   (Consider location/radiation/quality/duration/timing/severity/associated sxs/prior Treatment) HPI Isolated dental pain tooth #18 for the last few days, last spell was about a month ago the results of antibiotics, does not have a dentist to followup with, no fever or drooling stridor or voice change neck swelling shortness breath or other concerns, over-the-counter analgesics not helping the pain which is gradually becomes severe and constant over the last few days worse with chewing and touch without radiation or associated symptoms. Past Medical History  Diagnosis Date  . Heart murmur   . Anemia   . Urinary tract infection   . Gonorrhea   . Depression     refuses to take meds   Past Surgical History  Procedure Laterality Date  . Cesarean section      X 5   Family History  Problem Relation Age of Onset  . Other Neg Hx    History  Substance Use Topics  . Smoking status: Former Smoker    Quit date: 12/30/2011  . Smokeless tobacco: Never Used  . Alcohol Use: Yes     Comment: occasional   OB History   Grav Para Term Preterm Abortions TAB SAB Ect Mult Living   6 6 5 1      6      Review of Systems 10 Systems reviewed and are negative for acute change except as noted in the HPI. Allergies  Review of patient's allergies indicates no known allergies.  Home Medications   Current Outpatient Rx  Name  Route  Sig  Dispense  Refill  . acetaminophen (TYLENOL) 500 MG tablet   Oral   Take 1,000 mg by mouth every 6 (six) hours as needed for pain.         Marland Kitchen HYDROcodone-acetaminophen (NORCO) 5-325 MG per tablet   Oral   Take 2 tablets by mouth every 6 (six) hours as needed for severe pain.   10 tablet   0   . oxyCODONE-acetaminophen (PERCOCET/ROXICET) 5-325 MG per tablet   Oral   Take 1 tablet by  mouth every 8 (eight) hours as needed for pain.   11 tablet   0   . penicillin v potassium (VEETID) 500 MG tablet   Oral   Take 2 tablets (1,000 mg total) by mouth 2 (two) times daily. X 7 days   28 tablet   0    BP 126/82  Pulse 80  Temp(Src) 98.4 F (36.9 C) (Oral)  SpO2 100% Physical Exam  Nursing note and vitals reviewed. Constitutional:  Awake, alert, nontoxic appearance.  HENT:  Head: Atraumatic.  Mouth/Throat: Oropharynx is clear and moist. No oropharyngeal exudate.  Isolated tenderness with dental carie tooth #18 with localized gingival tenderness without purulent drainage; no stridor; no drooling; no trismus  Eyes: Right eye exhibits no discharge. Left eye exhibits no discharge.  Neck: Neck supple.  Cardiovascular: Normal rate and regular rhythm.   No murmur heard. Pulmonary/Chest: Effort normal and breath sounds normal. No respiratory distress. She has no wheezes. She has no rales. She exhibits no tenderness.  Abdominal: Soft. Bowel sounds are normal. She exhibits no distension. There is no tenderness. There is no rebound.  Musculoskeletal: She exhibits no tenderness.  Baseline ROM, no obvious new focal weakness.  Lymphadenopathy:    She has no  cervical adenopathy.  Neurological:  Mental status and motor strength appears baseline for patient and situation.  Skin: No rash noted.  Psychiatric: She has a normal mood and affect.    ED Course  Procedures (including critical care time) Patient / Family / Caregiver informed of clinical course, understand medical decision-making process, and agree with plan.  Labs Review Labs Reviewed - No data to display Imaging Review No results found.  EKG Interpretation   None       MDM   1. Dental cavity    I doubt any other EMC precluding discharge at this time including, but not necessarily limited to the following:deep space neck infection.    Hurman Horn, MD 04/05/13 [phone]

## 2013-03-31 NOTE — ED Notes (Signed)
Waiting for pt's ride home to arrive before giving pain medicine.

## 2013-03-31 NOTE — ED Notes (Signed)
Pt reports dental pain to L lower mouth, pain spreading into L ear.

## 2013-05-19 ENCOUNTER — Emergency Department (HOSPITAL_COMMUNITY)
Admission: EM | Admit: 2013-05-19 | Discharge: 2013-05-20 | Disposition: A | Discharge status: Home / Self Care | Payer: Medicaid Other | Attending: Emergency Medicine | Admitting: Emergency Medicine

## 2013-05-19 ENCOUNTER — Encounter (HOSPITAL_COMMUNITY): Payer: Self-pay | Admitting: Emergency Medicine

## 2013-05-19 DIAGNOSIS — K089 Disorder of teeth and supporting structures, unspecified: Secondary | ICD-10-CM | POA: Insufficient documentation

## 2013-05-19 DIAGNOSIS — R011 Cardiac murmur, unspecified: Secondary | ICD-10-CM | POA: Insufficient documentation

## 2013-05-19 DIAGNOSIS — Z87891 Personal history of nicotine dependence: Secondary | ICD-10-CM | POA: Insufficient documentation

## 2013-05-19 DIAGNOSIS — Z8619 Personal history of other infectious and parasitic diseases: Secondary | ICD-10-CM | POA: Insufficient documentation

## 2013-05-19 DIAGNOSIS — Z8659 Personal history of other mental and behavioral disorders: Secondary | ICD-10-CM | POA: Insufficient documentation

## 2013-05-19 DIAGNOSIS — K0889 Other specified disorders of teeth and supporting structures: Secondary | ICD-10-CM

## 2013-05-19 DIAGNOSIS — Z87440 Personal history of urinary (tract) infections: Secondary | ICD-10-CM | POA: Insufficient documentation

## 2013-05-19 DIAGNOSIS — Z862 Personal history of diseases of the blood and blood-forming organs and certain disorders involving the immune mechanism: Secondary | ICD-10-CM | POA: Insufficient documentation

## 2013-05-19 MED ORDER — HYDROCODONE-ACETAMINOPHEN 5-325 MG PO TABS: 1.0000 | ORAL_TABLET | Freq: Four times a day (QID) | ORAL | Status: DC | PRN

## 2013-05-19 MED ORDER — HYDROCODONE-ACETAMINOPHEN 5-325 MG PO TABS
2.0000 | ORAL_TABLET | Freq: Once | ORAL | Status: AC
  Administered 2013-05-19: 2 via ORAL
  Filled 2013-05-19: qty 2

## 2013-05-19 MED ORDER — PENICILLIN V POTASSIUM 500 MG PO TABS: 500.0000 mg | ORAL_TABLET | Freq: Four times a day (QID) | ORAL | Status: AC

## 2013-05-19 NOTE — ED Notes (Signed)
Pt states that she had a tooth broken left bottom back about 3 months ago and has not been able to get into dental office. Pt is suppose to go to dentist next week.

## 2013-05-19 NOTE — ED Provider Notes (Signed)
CSN: [id number]     Arrival date & time 05/19/13  2156 History   First MD Initiated Contact with Patient 05/19/13 2257     Chief Complaint  Patient presents with  . Dental Pain   (Consider location/radiation/quality/duration/timing/severity/associated sxs/prior Treatment) HPI Comments: Patient presenting with left lower dental pain that has been present intermittently over the past 3 months.  Pain has been worse over the past couple of days.  She reports that she had been taking Hydrocodone for the pain, which helped.   However, she ran out of the medication today.  She reports that she has an appointment with dentist scheduled next week.    Patient is a 27 y.o. female presenting with tooth pain. The history is provided by the patient.  Dental Pain Associated symptoms: no difficulty swallowing, no drooling, no facial pain, no facial swelling, no fever, no gum swelling, no neck pain, no neck swelling and no trismus     Past Medical History  Diagnosis Date  . Heart murmur   . Anemia   . Urinary tract infection   . Gonorrhea   . Depression     refuses to take meds   Past Surgical History  Procedure Laterality Date  . Cesarean section      X 5   Family History  Problem Relation Age of Onset  . Other Neg Hx    History  Substance Use Topics  . Smoking status: Former Smoker    Quit date: 12/30/2011  . Smokeless tobacco: Never Used  . Alcohol Use: Yes     Comment: occasional   OB History   Grav Para Term Preterm Abortions TAB SAB Ect Mult Living   6 6 5 1      6      Review of Systems  Constitutional: Negative for fever.  HENT: Negative for drooling and facial swelling.   Musculoskeletal: Negative for neck pain.  All other systems reviewed and are negative.    Allergies  Review of patient's allergies indicates no known allergies.  Home Medications   Current Outpatient Rx  Name  Route  Sig  Dispense  Refill  . HYDROcodone-acetaminophen (NORCO) 5-325 MG per tablet   Oral   Take 2 tablets by mouth every 6 (six) hours as needed for severe pain.   10 tablet   0    BP 127/84  Pulse 111  Temp(Src) 98.6 F (37 C) (Oral)  Resp 20  SpO2 98%  LMP 05/13/2013 Physical Exam  Nursing note and vitals reviewed. Constitutional: She is oriented to person, place, and time. She appears well-developed and well-nourished. No distress.  HENT:  Head: Normocephalic and atraumatic.  Mouth/Throat: Uvula is midline, oropharynx is clear and moist and mucous membranes are normal. No trismus in the jaw. Abnormal dentition. No dental abscesses or uvula swelling. No oropharyngeal exudate, posterior oropharyngeal edema, posterior oropharyngeal erythema or tonsillar abscesses.     Pt able to open and close mouth with out difficulty. Airway intact. Uvula midline. Mild gingival swelling with tenderness over affected area, but no fluctuance. No swelling or tenderness of submental and submandibular regions.  Eyes: Conjunctivae and EOM are normal.  Neck: Normal range of motion and full passive range of motion without pain. Neck supple.  Cardiovascular: Normal rate, regular rhythm and normal heart sounds.   Pulmonary/Chest: Effort normal and breath sounds normal. No stridor. No respiratory distress. She has no wheezes.  Musculoskeletal: Normal range of motion.  Lymphadenopathy:       Head (  right side): No submental, no submandibular, no tonsillar, no preauricular and no posterior auricular adenopathy present.       Head (left side): No submental, no submandibular, no tonsillar, no preauricular and no posterior auricular adenopathy present.    She has no cervical adenopathy.  Neurological: She is alert and oriented to person, place, and time.  Skin: Skin is warm and dry. No rash noted. She is not diaphoretic.    ED Course  Procedures (including critical care time) Labs Review Labs Reviewed - No data to display Imaging Review No results found.  EKG Interpretation   None         MDM  No diagnosis found. Patient with toothache.  No gross abscess.  Exam unconcerning for Ludwig's angina or spread of infection.  Will treat with penicillin and pain medicine.  Urged patient to follow-up with dentist.       Santiago Glad, PA-C 05/20/13 0023

## 2013-05-20 NOTE — ED Provider Notes (Signed)
Medical screening examination/treatment/procedure(s) were performed by non-physician practitioner and as supervising physician I was immediately available for consultation/collaboration.  EKG Interpretation   None         Felipe Paluch, MD 05/20/13 1440 

## 2013-09-02 ENCOUNTER — Emergency Department (HOSPITAL_COMMUNITY)
Admission: EM | Admit: 2013-09-02 | Discharge: 2013-09-03 | Disposition: A | Discharge status: Home / Self Care | Payer: Medicaid Other | Attending: Emergency Medicine | Admitting: Emergency Medicine

## 2013-09-02 ENCOUNTER — Encounter (HOSPITAL_COMMUNITY): Payer: Self-pay | Admitting: Emergency Medicine

## 2013-09-02 DIAGNOSIS — R109 Unspecified abdominal pain: Secondary | ICD-10-CM | POA: Insufficient documentation

## 2013-09-02 DIAGNOSIS — R0789 Other chest pain: Secondary | ICD-10-CM | POA: Insufficient documentation

## 2013-09-02 DIAGNOSIS — B373 Candidiasis of vulva and vagina: Secondary | ICD-10-CM | POA: Insufficient documentation

## 2013-09-02 DIAGNOSIS — Z862 Personal history of diseases of the blood and blood-forming organs and certain disorders involving the immune mechanism: Secondary | ICD-10-CM | POA: Insufficient documentation

## 2013-09-02 DIAGNOSIS — R011 Cardiac murmur, unspecified: Secondary | ICD-10-CM | POA: Insufficient documentation

## 2013-09-02 DIAGNOSIS — B379 Candidiasis, unspecified: Secondary | ICD-10-CM

## 2013-09-02 DIAGNOSIS — R519 Headache, unspecified: Secondary | ICD-10-CM

## 2013-09-02 DIAGNOSIS — Z87440 Personal history of urinary (tract) infections: Secondary | ICD-10-CM | POA: Insufficient documentation

## 2013-09-02 DIAGNOSIS — Z3202 Encounter for pregnancy test, result negative: Secondary | ICD-10-CM | POA: Insufficient documentation

## 2013-09-02 DIAGNOSIS — Z87891 Personal history of nicotine dependence: Secondary | ICD-10-CM | POA: Insufficient documentation

## 2013-09-02 DIAGNOSIS — Z8659 Personal history of other mental and behavioral disorders: Secondary | ICD-10-CM | POA: Insufficient documentation

## 2013-09-02 DIAGNOSIS — R51 Headache: Secondary | ICD-10-CM | POA: Insufficient documentation

## 2013-09-02 DIAGNOSIS — B3731 Acute candidiasis of vulva and vagina: Secondary | ICD-10-CM | POA: Insufficient documentation

## 2013-09-02 LAB — COMPREHENSIVE METABOLIC PANEL
ALBUMIN: 3.9 g/dL (ref 3.5–5.2)
ALT: 12 U/L (ref 0–35)
AST: 20 U/L (ref 0–37)
Alkaline Phosphatase: 89 U/L (ref 39–117)
BUN: 12 mg/dL (ref 6–23)
CHLORIDE: 101 meq/L (ref 96–112)
CO2: 27 mEq/L (ref 19–32)
CREATININE: 0.69 mg/dL (ref 0.50–1.10)
Calcium: 9.8 mg/dL (ref 8.4–10.5)
GFR calc Af Amer: >90 mL/min (ref >90)
GFR calc non Af Amer: >90 mL/min (ref >90)
Glucose, Bld: 86 mg/dL (ref 70–99)
Potassium: 4.2 mEq/L (ref 3.7–5.3)
Sodium: 139 mEq/L (ref 137–147)
TOTAL PROTEIN: 7.7 g/dL (ref 6.0–8.3)

## 2013-09-02 LAB — POC URINE PREG, ED: PREG TEST UR: NEGATIVE

## 2013-09-02 LAB — CBC
HEMATOCRIT: 36.7 % (ref 36.0–46.0)
Hemoglobin: 11.5 g/dL — ABNORMAL LOW (ref 12.0–15.0)
MCH: 29.5 pg (ref 26.0–34.0)
MCHC: 31.3 g/dL (ref 30.0–36.0)
MCV: 94.1 fL (ref 78.0–100.0)
PLATELETS: 376 10*3/uL (ref 150–400)
RBC: 3.9 MIL/uL (ref 3.87–5.11)
RDW: 14.1 % (ref 11.5–15.5)
WBC: 8.4 10*3/uL (ref 4.0–10.5)

## 2013-09-02 MED ORDER — SODIUM CHLORIDE 0.9 % IV BOLUS (SEPSIS): 1000.0000 mL | Freq: Once | INTRAVENOUS | Status: DC

## 2013-09-02 MED ORDER — KETOROLAC TROMETHAMINE 30 MG/ML IJ SOLN: 30.0000 mg | Freq: Once | INTRAMUSCULAR | Status: DC

## 2013-09-02 NOTE — ED Provider Notes (Signed)
CSN: [id number]     Arrival date & time 09/02/13  2100 History   First MD Initiated Contact with Patient 09/02/13 2256     Chief Complaint  Patient presents with  . Headache     (Consider location/radiation/quality/duration/timing/severity/associated sxs/prior Treatment) HPI Comments: Heather Lynch is a 28 y.o. female with a past medical history of murmur, anemia, gonorrhea presenting the Emergency Department with a chief complaint of headache since last night.  She reports headache that iis located in her temples, bending over increases the discomfort. Denies visual symptoms, reports phonophobia.  She reports foul urine smell and abnormal white vaginal discharge for several weeks.  She also states she had a short episode of palpitations after consuming caffeine. Resolved, no chest pain or SOB.   Patient is a 28 y.o. female presenting with headaches. The history is provided by the patient. No language interpreter was used.  Headache Pain location:  L temporal and R temporal Associated symptoms: abdominal pain   Associated symptoms: no diarrhea, no fever, no nausea and no vomiting     Past Medical History  Diagnosis Date  . Heart murmur   . Anemia   . Urinary tract infection   . Gonorrhea   . Depression     refuses to take meds   Past Surgical History  Procedure Laterality Date  . Cesarean section      X 5   Family History  Problem Relation Age of Onset  . Other Neg Hx    History  Substance Use Topics  . Smoking status: Former Smoker    Quit date: 12/30/2011  . Smokeless tobacco: Never Used  . Alcohol Use: Yes     Comment: occasional   OB History   Grav Para Term Preterm Abortions TAB SAB Ect Mult Living   6 6 5 1      6      Review of Systems  Constitutional: Negative for fever and chills.  Gastrointestinal: Positive for abdominal pain. Negative for nausea, vomiting, diarrhea and constipation.  Genitourinary: Positive for dysuria and vaginal discharge.   Neurological: Positive for headaches.      Allergies  Review of patient's allergies indicates no known allergies.  Home Medications   Prior to Admission medications   Not on File   BP 132/83  Pulse 75  Temp(Src) 98.7 F (37.1 C) (Oral)  Resp 16  SpO2 100%  LMP 08/18/2013 Physical Exam  Nursing note and vitals reviewed. Constitutional: She is oriented to person, place, and time. She appears well-developed and well-nourished. No distress.  HENT:  Head: Normocephalic and atraumatic.  Eyes: Conjunctivae and EOM are normal. Pupils are equal, round, and reactive to light.  Neck: Normal range of motion. Neck supple.  Cardiovascular: Normal rate and regular rhythm.   Murmur heard. No lower extremity edema  Pulmonary/Chest: Effort normal and breath sounds normal. No respiratory distress. She has no wheezes. She has no rales. She exhibits no tenderness.  Patient is able to speak in complete sentences.   Abdominal: Soft. Bowel sounds are normal. There is no tenderness.  Genitourinary: Uterus is not tender. Cervix exhibits no motion tenderness, no discharge and no friability. Right adnexum displays no mass, no tenderness and no fullness. Left adnexum displays no mass, no tenderness and no fullness. No bleeding around the vagina. Vaginal discharge found.  Mild amount of white vaginal discharge in vaginal vault.    Musculoskeletal: Normal range of motion.  Neurological: She is alert and oriented to person, place, and time.  Speech is clear and goal oriented, follows commands Cranial nerves III - XII grossly intact, no facial droop Normal strength in upper and lower extremities bilaterally, strong and equal grip strength Sensation normal to light  touch Normal gait without assistance, moves all 4 extremities without ataxia, coordination intact Normal finger to nose and rapid alternating movements No pronator drift  Skin: Skin is warm and dry. She is not diaphoretic.  Psychiatric: She  has a normal mood and affect. Her behavior is normal.    ED Course  Procedures (including critical care time) Labs Review Labs Reviewed  CBC - Abnormal; Notable for the following:    Hemoglobin 11.5 (*)    All other components within normal limits  COMPREHENSIVE METABOLIC PANEL - Abnormal; Notable for the following:    Total Bilirubin <0.2 (*)    All other components within normal limits  URINALYSIS, ROUTINE W REFLEX MICROSCOPIC  POC URINE PREG, ED    Imaging Review No results found.   EKG Interpretation   Date/Time:  Thursday September 02 2013 21:43:10 EDT Ventricular Rate:  72 PR Interval:  220 QRS Duration: 91 QT Interval:  404 QTC Calculation: 442 R Axis:   79 Text Interpretation:  Sinus rhythm Prolonged PR interval ST elev, probable  normal early repol pattern Baseline wander in lead(s) I III aVL V5 V6 No  previous ECGs available Confirmed by [facility]  MD, Jonny RuizJOHN ([id number]) on 09/02/2013  9:55:35 PM      MDM   Final diagnoses:  Headache  Chest discomfort  Yeast infection   Pt presents with headache and a brief episode of chest discomfot after drinking caffeine.  No red flags on exam, toradol order, pt refused fluids and medication.  EKG shows prolonged PR interval. UA without infection, negative pregnancy, CBC and CMP without concerning abnormalities. Re-eval pt resting comfortably in room with 4 children. Pelvic shows white discharge, likely yeast. Specula exam performed by PA-student, I was there for assistance and visualized the vaginal vault and cervix.  Personally performed the bimanual exam as well. Wet prep shows yeast. Discussed lab results, imaging results, and treatment plan with the patient. Return precautions given. Reports understanding and no other concerns at this time.  Patient is stable for discharge at this time.  Meds given in ED:  Medications  sodium chloride 0.9 % bolus 1,000 mL (1,000 mLs Intravenous Not Given 09/03/13 0035)  ketorolac (TORADOL) 30  MG/ML injection 30 mg (30 mg Intravenous Not Given 09/03/13 0035)    New Prescriptions   No medications on file       Clabe SealLauren M Mardella Nuckles, PA-C 09/03/13 1500

## 2013-09-02 NOTE — ED Notes (Addendum)
Pt states that she had a headache when she went to sleep lat night. Headache remains today and pt states that she has sharp pains that come and go. Pt reports tingling to finger tips that went away and came back. Pt reports nausea, no vomiting. Pt alert and ambulatory in triage. Pt reports "feeling like my heart is fluttering and I get short of breath".

## 2013-09-03 LAB — GC/CHLAMYDIA PROBE AMP
CT PROBE, AMP APTIMA: NEGATIVE
GC PROBE AMP APTIMA: NEGATIVE

## 2013-09-03 LAB — WET PREP, GENITAL
Clue Cells Wet Prep HPF POC: NONE SEEN
TRICH WET PREP: NONE SEEN

## 2013-09-03 LAB — URINALYSIS, ROUTINE W REFLEX MICROSCOPIC
Bilirubin Urine: NEGATIVE
GLUCOSE, UA: NEGATIVE mg/dL
Hgb urine dipstick: NEGATIVE
Ketones, ur: NEGATIVE mg/dL
LEUKOCYTES UA: NEGATIVE
NITRITE: NEGATIVE
PH: 6.5 (ref 5.0–8.0)
Protein, ur: NEGATIVE mg/dL
SPECIFIC GRAVITY, URINE: 1.022 (ref 1.005–1.030)
Urobilinogen, UA: 1 mg/dL (ref 0.0–1.0)

## 2013-09-03 MED ORDER — FLUCONAZOLE 150 MG PO TABS
150.0000 mg | ORAL_TABLET | Freq: Every day | ORAL | Status: DC
  Administered 2013-09-03: 150 mg via ORAL
  Filled 2013-09-03: qty 1

## 2013-09-03 MED ORDER — FLUCONAZOLE 150 MG PO TABS: 150.0000 mg | ORAL_TABLET | Freq: Once | ORAL | Status: DC

## 2013-09-03 MED ORDER — KETOROLAC TROMETHAMINE 30 MG/ML IJ SOLN
30.0000 mg | Freq: Once | INTRAMUSCULAR | Status: DC
  Filled 2013-09-03: qty 1

## 2013-09-03 MED ORDER — IBUPROFEN 800 MG PO TABS: 800.0000 mg | ORAL_TABLET | Freq: Three times a day (TID) | ORAL | Status: DC

## 2013-09-03 NOTE — Discharge Instructions (Signed)
Call for a follow up appointment with a Family or Primary Care Provider.  [facility] for further evaluation of your vaginal discharge. Return if Symptoms worsen.   Take medication as prescribed.  You can take the Diflucan pill in 72 hours (3 days) if you continue to have abnormal vaginal discharge.   Emergency Department Resource Guide 1) Find a Doctor and Pay Out of Pocket Although you won't have to find out who is covered by your insurance plan, it is a good idea to ask around and get recommendations. You will then need to call the office and see if the doctor you have chosen will accept you as a new patient and what types of options they offer for patients who are self-pay. Some doctors offer discounts or will set up payment plans for their patients who do not have insurance, but you will need to ask so you aren't surprised when you get to your appointment.  2) Contact Your Local Health Department Not all health departments have doctors that can see patients for sick visits, but many do, so it is worth a call to see if yours does. If you don't know where your local health department is, you can check in your phone book. The CDC also has a tool to help you locate your state's health department, and many state websites also have listings of all of their local health departments.  3) Find a Walk-in Clinic If your illness is not likely to be very severe or complicated, you may want to try a walk in clinic. These are popping up all over the country in pharmacies, drugstores, and shopping centers. They're usually staffed by nurse practitioners or physician assistants that have been trained to treat common illnesses and complaints. They're usually fairly quick and inexpensive. However, if you have serious medical issues or chronic medical problems, these are probably not your best option.  No Primary Care Doctor: - Call Health Connect at  [phone] - they can help you locate a primary  care doctor that  accepts your insurance, provides certain services, etc. - Physician Referral Service- [phone]  Chronic Pain Problems: Organization         Address  Phone   Notes  [facility]  [phone] Patients need to be referred by their primary care doctor.   Medication Assistance: Organization         Address  Phone   Notes  [facility] Medication [facility] [address] [place], [place] [address] [phone] --Must be a resident of [facility] [place] -- Must have NO insurance coverage whatsoever (no Medicaid/ Medicare, etc.) -- The pt. MUST have a primary care doctor that directs their care regularly and follows them in the community   MedAssist  [phone]   [employer]  [phone]    Agencies that provide inexpensive medical care: Organization         Address  Phone   Notes  [facility]  [phone]   [facility]    [phone]   [facility] [address] [place], [place] [address] [phone]   [facility] [address], [place] [phone]) [phone]   [facility]anned Parenthood    [phone]) [phone]   [facility]    [phone]   [facility]  [address], [place] Phone:  [phone](336) [phone], Fax:  [phone])  [phone] Hours of Operation:  9 am - 6 pm, M-F.  Also accepts Medicaid/Medicare and self-pay.  [facility] for [place], [address], [place] Phone: [phone], Fax: [phone]. Hours of Operation:  8:30 am - 5:30 pm, M-F.  Also accepts Medicaid and self-pay.  [facility] [place] [address], [place] Phone: [phone]   [place], [place], [place] [phone], Ext. [phone] Mondays & Thursdays: 7-9 AM.  First 15 patients are seen on a first come, first serve basis.    [place]  Providers:  Organization         Address  Phone   Notes  [facility] [address], [place] [phone] Also accepts self-pay patients.  [facility] [address] [place], [place]  [phone]   [place], [address], [place] [phone]   [facility] [address], [place] [phone]   Lucianne Lei [address], [place]   [phone] Only accepts [place] Access [place] patients after they have their name applied to their card.   Self-Pay (no insurance) in [facility]:  Organization         Address  Phone   Notes  Sickle Cell Patients, [facility] Internal [facility] [place] [phone]   [facility] [place] [phone]   [facility] [place]  [place], [place], [place] [phone]   [facility]/Dr. Osei-Bonsu  [address], [place] or [address], [place] [phone] Phone number for both [place] and [place] locations is the same.  Urgent Medical and [facility] [address] [place] [phone]   [facility] [address] [place] or [address] [phone]   [facility] [address], [place] [phone], phone; [phone], fax Sees patients 1st and 3rd Saturday of every month.  Must not qualify for public or private insurance (i.e. Medicaid, Medicare, [place] Health Choice, Veterans' Benefits)  Household income should be no more than 200% of the poverty level The clinic cannot treat you if you are pregnant or think you are pregnant  Sexually transmitted diseases are not treated at the clinic.    Dental Care: Organization         Address  Phone  Notes  [facility] [place] [phone] Accepts children up to age 33 who are enrolled in [place] or [place]; pregnant women with a Medicaid card; and children who have applied for Medicaid or [place] Health Choice, but were declined, whose parents can pay a reduced fee at time of service.  [facility] Department of [facility]  [address], [place] [phone] Accepts children up to age 77 who are enrolled in [place] or [place]; pregnant women with a Medicaid card; and children who have applied for Medicaid or  Health Choice, but were declined, whose parents can pay a reduced fee at time of service.  [place] Adult Dental Access PROGRAM  [place] [phone] Patients are seen by appointment only. Walk-ins are not accepted. [place]  will see patients 14 years of age and older. Monday - Tuesday (8am-5pm) Most Wednesdays (8:30-5pm) $30 per visit, cash only  [facility] Adult Dental Access PROGRAM  [address], [facility] [phone] Patients are seen by appointment only. Walk-ins are not accepted. [place] will see patients 45 years of age and older. One Wednesday Evening (Monthly: Volunteer Based).  $30 per visit, cash only  [place]  [phone] for adults; Children under age 38, call Graduate Pediatric Dentistry at [phone]. Children aged 29-14, please call [phone] to request a pediatric application.  Dental services are provided in all areas of dental care including fillings, crowns and bridges, complete and partial dentures, implants, gum treatment, root canals, and extractions. Preventive care is also provided. Treatment is provided to both adults and children. Patients are selected via a lottery and there is often a waiting list.   [facility] [address], [place]  [phone] [email or web address]   [facility]  [address] [place], [place] [phone], Ext. [phone] Second and Fourth Thursday of each month, opens at 6:30 AM; Clinic ends at 9 AM.  Patients are seen on a first-come first-served basis, and a limited number are seen during each clinic.   [facility]  [address] Hillard Danker [place], [place] [phone]   Eligibility Requirements You must have lived in [place], [place], or [place] counties for at least the last three months.   You cannot be eligible for state or federal sponsored [employer], including [employer], [place], or [employer].   You generally cannot be eligible for healthcare insurance through your employer.    How to apply: Eligibility screenings are held every Tuesday and Wednesday afternoon from 1:00 pm until 4:00 pm. You do not need an appointment for the interview!  [facility] [address] [place], [place]   [place]  Welcome Department  [place]  [phone]    Behavioral Health Resources in the Community: Intensive Outpatient Programs Organization         Address  Phone  Notes  [place] [place], [place] [phone]   [facility] Outpatient [address] [place], [place]   ADS: Alcohol & Drug Svcs [address] [place], [place]   [place] [address]  [place], [place] or [phone]   Substance Abuse Resources Organization         Address  Phone  Notes  Alcohol and Drug Services  [phone]   [place]  [phone]   The [place]   Chinita Pester  [phone]   Residential & Outpatient Substance Abuse Program  [phone]   Psychological Services Organization         Address  Phone  Notes  [facility] [place]  [place]  [phone]   [place] [address], [place] or [phone]    Mobile Crisis Teams Organization         Address  Phone  Notes  [facility], Mobile Crisis Care Unit  [phone]   [facility]ive Psychotherapeutic Services  [address] [place], [place]   [facility] [address] [place] [phone]    Self-Help/Support Groups Organization  Address  Phone             Notes  [place]. of [place] - variety of support groups  [place] Call for more information  Narcotics Anonymous (NA), [facility]aring [facility] [address], [employer] [place]  2 meetings at this location   Special educational needs teacher         Address  Phone  Notes  ASAP Residential Treatment [place],    [place]  [phone]   [facility]  [address], [place] [address], [place], [place]   [place], [place] [phone] Admissions: 8am-3pm M-F  Incentives Substance [place] [address],    [place], [place] [phone]   [facility] [address] [place], [place], [place]   [facility] [address]  [place], [place]   [facility] Programs - Intensive Outpatient [address], [place], [place]   [facility] ([place] [place].,  [place], [place] [phone] or [phone]   [facility] Services (RTS) [address], [place], [place] Accepts Medicaid  [facility] [address],  [place] [phone] Substance Abuse/Addiction Treatment   [facility] Organization         Address  Phone  Notes  [facility]  [phone]   Domenic Schwab, PhD [address] [place]lis [place], [place]   [phone] or [phone]    [place], [place] [phone]   [facility] [address] [place], [place] [phone] Insurance/Medicaid/sponsorship through [facility] and Families [address]                                    [place], [place] [phone] [place] [address], [place] [phone]    Dr. Adele Schilder  [phone]   [facility] Dept. 1) [address] [place] 2) [place] 3)  [place] [address], [place] [phone]  [phone]   [place] [phone] or [phone] (After Hours)

## 2013-09-04 NOTE — ED Provider Notes (Signed)
Medical screening examination/treatment/procedure(s) were performed by non-physician practitioner and as supervising physician I was immediately available for consultation/collaboration.   EKG Interpretation   Date/Time:  Thursday September 02 2013 21:43:10 EDT Ventricular Rate:  72 PR Interval:  220 QRS Duration: 91 QT Interval:  404 QTC Calculation: 442 R Axis:   79 Text Interpretation:  Sinus rhythm Prolonged PR interval ST elev, probable  normal early repol pattern Baseline wander in lead(s) I III aVL V5 V6 No  previous ECGs available Confirmed by [facility]  [facility], Jonny RuizJOHN ([id number]) on 09/02/2013  9:55:35 PM        Hanley SeamenJohn L Jaishaun Mcnab, MD 09/04/13 [id number]

## 2014-02-09 ENCOUNTER — Emergency Department (HOSPITAL_COMMUNITY)
Admission: EM | Admit: 2014-02-09 | Discharge: 2014-02-10 | Disposition: A | Discharge status: Home / Self Care | Payer: Medicaid Other | Attending: Emergency Medicine | Admitting: Emergency Medicine

## 2014-02-09 ENCOUNTER — Encounter (HOSPITAL_COMMUNITY): Payer: Self-pay | Admitting: Emergency Medicine

## 2014-02-09 DIAGNOSIS — Z87891 Personal history of nicotine dependence: Secondary | ICD-10-CM | POA: Insufficient documentation

## 2014-02-09 DIAGNOSIS — Y9389 Activity, other specified: Secondary | ICD-10-CM | POA: Diagnosis not present

## 2014-02-09 DIAGNOSIS — S0990XA Unspecified injury of head, initial encounter: Secondary | ICD-10-CM | POA: Diagnosis not present

## 2014-02-09 DIAGNOSIS — Z8619 Personal history of other infectious and parasitic diseases: Secondary | ICD-10-CM | POA: Diagnosis not present

## 2014-02-09 DIAGNOSIS — Z862 Personal history of diseases of the blood and blood-forming organs and certain disorders involving the immune mechanism: Secondary | ICD-10-CM | POA: Diagnosis not present

## 2014-02-09 DIAGNOSIS — Y92410 Unspecified street and highway as the place of occurrence of the external cause: Secondary | ICD-10-CM | POA: Diagnosis not present

## 2014-02-09 DIAGNOSIS — Z87440 Personal history of urinary (tract) infections: Secondary | ICD-10-CM | POA: Diagnosis not present

## 2014-02-09 DIAGNOSIS — Z8659 Personal history of other mental and behavioral disorders: Secondary | ICD-10-CM | POA: Diagnosis not present

## 2014-02-09 DIAGNOSIS — R011 Cardiac murmur, unspecified: Secondary | ICD-10-CM | POA: Diagnosis not present

## 2014-02-09 DIAGNOSIS — G44209 Tension-type headache, unspecified, not intractable: Secondary | ICD-10-CM

## 2014-02-09 MED ORDER — DIPHENHYDRAMINE HCL 50 MG/ML IJ SOLN
25.0000 mg | Freq: Once | INTRAMUSCULAR | Status: DC
  Filled 2014-02-09: qty 1

## 2014-02-09 MED ORDER — DIPHENHYDRAMINE HCL 50 MG/ML IJ SOLN
25.0000 mg | Freq: Once | INTRAMUSCULAR | Status: AC
  Administered 2014-02-09: 25 mg via INTRAVENOUS

## 2014-02-09 MED ORDER — METOCLOPRAMIDE HCL 5 MG/ML IJ SOLN
10.0000 mg | Freq: Once | INTRAMUSCULAR | Status: DC
  Filled 2014-02-09: qty 2

## 2014-02-09 MED ORDER — DEXAMETHASONE SODIUM PHOSPHATE 10 MG/ML IJ SOLN
10.0000 mg | Freq: Once | INTRAMUSCULAR | Status: AC
  Administered 2014-02-09: 10 mg via INTRAVENOUS

## 2014-02-09 MED ORDER — DEXAMETHASONE SODIUM PHOSPHATE 10 MG/ML IJ SOLN
10.0000 mg | Freq: Once | INTRAMUSCULAR | Status: DC
  Filled 2014-02-09: qty 1

## 2014-02-09 MED ORDER — METOCLOPRAMIDE HCL 5 MG/ML IJ SOLN
10.0000 mg | Freq: Once | INTRAMUSCULAR | Status: AC
  Administered 2014-02-09: 10 mg via INTRAVENOUS
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
## 2014-02-10 NOTE — Discharge Instructions (Signed)
Headaches, Frequently Asked Questions °MIGRAINE HEADACHES °Q: What is migraine? What causes it? How can I treat it? °A: Generally, migraine headaches begin as a dull ache. Then they develop into a constant, throbbing, and pulsating pain. You may experience pain at the temples. You may experience pain at the front or back of one or both sides of the head. The pain is usually accompanied by a combination of: °· Nausea. °· Vomiting. °· Sensitivity to light and noise. °Some people (about 15%) experience an aura (see below) before an attack. The cause of migraine is believed to be chemical reactions in the brain. Treatment for migraine may include over-the-counter or prescription medications. It may also include self-help techniques. These include relaxation training and biofeedback.  °Q: What is an aura? °A: About 15% of people with migraine get an "aura". This is a sign of neurological symptoms that occur before a migraine headache. You may see wavy or jagged lines, dots, or flashing lights. You might experience tunnel vision or blind spots in one or both eyes. The aura can include visual or auditory hallucinations (something imagined). It may include disruptions in smell (such as strange odors), taste or touch. Other symptoms include: °· Numbness. °· A "pins and needles" sensation. °· Difficulty in recalling or speaking the correct word. °These neurological events may last as long as 60 minutes. These symptoms will fade as the headache begins. °Q: What is a trigger? °A: Certain physical or environmental factors can lead to or "trigger" a migraine. These include: °· Foods. °· Hormonal changes. °· Weather. °· Stress. °It is important to remember that triggers are different for everyone. To help prevent migraine attacks, you need to figure out which triggers affect you. Keep a headache diary. This is a good way to track triggers. The diary will help you talk to your healthcare professional about your condition. °Q: Does  weather affect migraines? °A: Bright sunshine, hot, humid conditions, and drastic changes in barometric pressure may lead to, or "trigger," a migraine attack in some people. But studies have shown that weather does not act as a trigger for everyone with migraines. °Q: What is the link between migraine and hormones? °A: Hormones start and regulate many of your body's functions. Hormones keep your body in balance within a constantly changing environment. The levels of hormones in your body are unbalanced at times. Examples are during menstruation, pregnancy, or menopause. That can lead to a migraine attack. In fact, about three quarters of all women with migraine report that their attacks are related to the menstrual cycle.  °Q: Is there an increased risk of stroke for migraine sufferers? °A: The likelihood of a migraine attack causing a stroke is very remote. That is not to say that migraine sufferers cannot have a stroke associated with their migraines. In persons under age 40, the most common associated factor for stroke is migraine headache. But over the course of a person's normal life span, the occurrence of migraine headache may actually be associated with a reduced risk of dying from cerebrovascular disease due to stroke.  °Q: What are acute medications for migraine? °A: Acute medications are used to treat the pain of the headache after it has started. Examples over-the-counter medications, NSAIDs, ergots, and triptans.  °Q: What are the triptans? °A: Triptans are the newest class of abortive medications. They are specifically targeted to treat migraine. Triptans are vasoconstrictors. They moderate some chemical reactions in the brain. The triptans work on receptors in your brain. Triptans help   to restore the balance of a neurotransmitter called serotonin. Fluctuations in levels of serotonin are thought to be a main cause of migraine.  °Q: Are over-the-counter medications for migraine effective? °A:  Over-the-counter, or "OTC," medications may be effective in relieving mild to moderate pain and associated symptoms of migraine. But you should see your caregiver before beginning any treatment regimen for migraine.  °Q: What are preventive medications for migraine? °A: Preventive medications for migraine are sometimes referred to as "prophylactic" treatments. They are used to reduce the frequency, severity, and length of migraine attacks. Examples of preventive medications include antiepileptic medications, antidepressants, beta-blockers, calcium channel blockers, and NSAIDs (nonsteroidal anti-inflammatory drugs). °Q: Why are anticonvulsants used to treat migraine? °A: During the past few years, there has been an increased interest in antiepileptic drugs for the prevention of migraine. They are sometimes referred to as "anticonvulsants". Both epilepsy and migraine may be caused by similar reactions in the brain.  °Q: Why are antidepressants used to treat migraine? °A: Antidepressants are typically used to treat people with depression. They may reduce migraine frequency by regulating chemical levels, such as serotonin, in the brain.  °Q: What alternative therapies are used to treat migraine? °A: The term "alternative therapies" is often used to describe treatments considered outside the scope of conventional Western medicine. Examples of alternative therapy include acupuncture, acupressure, and yoga. Another common alternative treatment is herbal therapy. Some herbs are believed to relieve headache pain. Always discuss alternative therapies with your caregiver before proceeding. Some herbal products contain arsenic and other toxins. °TENSION HEADACHES °Q: What is a tension-type headache? What causes it? How can I treat it? °A: Tension-type headaches occur randomly. They are often the result of temporary stress, anxiety, fatigue, or anger. Symptoms include soreness in your temples, a tightening band-like sensation  around your head (a "vice-like" ache). Symptoms can also include a pulling feeling, pressure sensations, and contracting head and neck muscles. The headache begins in your forehead, temples, or the back of your head and neck. Treatment for tension-type headache may include over-the-counter or prescription medications. Treatment may also include self-help techniques such as relaxation training and biofeedback. °CLUSTER HEADACHES °Q: What is a cluster headache? What causes it? How can I treat it? °A: Cluster headache gets its name because the attacks come in groups. The pain arrives with little, if any, warning. It is usually on one side of the head. A tearing or bloodshot eye and a runny nose on the same side of the headache may also accompany the pain. Cluster headaches are believed to be caused by chemical reactions in the brain. They have been described as the most severe and intense of any headache type. Treatment for cluster headache includes prescription medication and oxygen. °SINUS HEADACHES °Q: What is a sinus headache? What causes it? How can I treat it? °A: When a cavity in the bones of the face and skull (a sinus) becomes inflamed, the inflammation will cause localized pain. This condition is usually the result of an allergic reaction, a tumor, or an infection. If your headache is caused by a sinus blockage, such as an infection, you will probably have a fever. An x-ray will confirm a sinus blockage. Your caregiver's treatment might include antibiotics for the infection, as well as antihistamines or decongestants.  °REBOUND HEADACHES °Q: What is a rebound headache? What causes it? How can I treat it? °A: A pattern of taking acute headache medications too often can lead to a condition known as "rebound headache."   A pattern of taking too much headache medication includes taking it more than 2 days per week or in excessive amounts. That means more than the label or a caregiver advises. With rebound  headaches, your medications not only stop relieving pain, they actually begin to cause headaches. Doctors treat rebound headache by tapering the medication that is being overused. Sometimes your caregiver will gradually substitute a different type of treatment or medication. Stopping may be a challenge. Regularly overusing a medication increases the potential for serious side effects. Consult a caregiver if you regularly use headache medications more than 2 days per week or more than the label advises. °ADDITIONAL QUESTIONS AND ANSWERS °Q: What is biofeedback? °A: Biofeedback is a self-help treatment. Biofeedback uses special equipment to monitor your body's involuntary physical responses. Biofeedback monitors: °· Breathing. °· Pulse. °· Heart rate. °· Temperature. °· Muscle tension. °· Brain activity. °Biofeedback helps you refine and perfect your relaxation exercises. You learn to control the physical responses that are related to stress. Once the technique has been mastered, you do not need the equipment any more. °Q: Are headaches hereditary? °A: Four out of five (80%) of people that suffer report a family history of migraine. Scientists are not sure if this is genetic or a family predisposition. Despite the uncertainty, a child has a 50% chance of having migraine if one parent suffers. The child has a 75% chance if both parents suffer.  °Q: Can children get headaches? °A: By the time they reach high school, most young people have experienced some type of headache. Many safe and effective approaches or medications can prevent a headache from occurring or stop it after it has begun.  °Q: What type of doctor should I see to diagnose and treat my headache? °A: Start with your primary caregiver. Discuss his or her experience and approach to headaches. Discuss methods of classification, diagnosis, and treatment. Your caregiver may decide to recommend you to a headache specialist, depending upon your symptoms or other  physical conditions. Having diabetes, allergies, etc., may require a more comprehensive and inclusive approach to your headache. The National Headache Foundation will provide, upon request, a list of NHF physician members in your state. °Document Released: 07/27/2003 Document Revised: 07/29/2011 Document Reviewed: 01/04/2008 °ExitCare® Patient Information ©2015 [employer]. This information is not intended to replace advice given to you by your health care provider. Make sure you discuss any questions you have with your health care provider. ° ° °Emergency Department Resource Guide °1) Find a Doctor and Pay Out of Pocket °Although you won't have to find out who is covered by your insurance plan, it is a good idea to ask around and get recommendations. You will then need to call the office and see if the doctor you have chosen will accept you as a new patient and what types of options they offer for patients who are self-pay. Some doctors offer discounts or will set up payment plans for their patients who do not have insurance, but you will need to ask so you aren't surprised when you get to your appointment. ° °2) Contact Your Local Health Department °Not all health departments have doctors that can see patients for sick visits, but many do, so it is worth a call to see if yours does. If you don't know where your local health department is, you can check in your phone book. The CDC also has a tool to help you locate your state's health department, and many state websites also   have listings of all of their local health departments. ° °3) Find a Walk-in Clinic °If your illness is not likely to be very severe or complicated, you may want to try a walk in clinic. These are popping up all over the country in pharmacies, drugstores, and shopping centers. They're usually staffed by nurse practitioners or physician assistants that have been trained to treat common illnesses and complaints. They're usually fairly quick and  inexpensive. However, if you have serious medical issues or chronic medical problems, these are probably not your best option. ° °No Primary Care Doctor: °- Call [facility] at  832-8000 - they can help you locate a primary care doctor that  accepts your insurance, provides certain services, etc. °- Physician Referral Service- 1-800-533-3463 ° °Chronic Pain Problems: °Organization         Address  Phone   Notes  °[facility]  (336) 297-2271 Patients need to be referred by their primary care doctor.  ° °Medication Assistance: °Organization         Address  Phone   Notes  °[place] [facility] [address] °[place], [place] [address] (336) 641-8030 --Must be a resident of [place] °-- Must have NO insurance coverage whatsoever (no Medicaid/ Medicare, etc.) °-- The pt. MUST have a primary care doctor that directs their care regularly and follows them in the community °  °[facility]  (866) 331-1348   °[employer]  (888) 892-1162   ° °Agencies that provide inexpensive medical care: °Organization         Address  Phone   Notes  °[facility]  (336) 832-8035   °[facility]    (336) 832-7272   °[facility] [address] °[place], [place] [address] (336) 832-4777   °[facility] [address], °[place] (336) 271-4999   °[facility]    (336) 373-0678   °[facility]    (336) 272-1050   °[facility] ° [address], [place] Phone:  (336) 832-4444, Fax:  (336) 832-4440 Hours of Operation:  9 am - 6 pm, M-F.  Also accepts Medicaid/Medicare and self-pay.  °[facility] ° [address], [place] Phone: (336) [phone], Fax: (336) [phone]. Hours of Operation:  8:30 am - 5:30 pm, M-F.  Also accepts Medicaid and self-pay.  °[facility] [place] [address], [place] Phone: (336) 878-6027   °[facility] [address], [place], [place] (336)723-1848, Ext. 123 Mondays & Thursdays: 7-9 AM.  First 15 patients are seen on a first come, first serve basis. °  ° °Medicaid-accepting [place] Providers: ° °Organization         Address  Phone   Notes  °[facility] [address], [place] (336) 641-2100 Also accepts self-pay patients.  °[facility] [address], [place] ° (336) 856-9996   °[facility] [address], [place] (336) 288-8857   °[facility] [address] [place] [address], [place] (336) 299-7000   °Veita Bland [address],   ° (336) 373-1557 Only accepts [place] Access Medicaid patients after they have their name applied to their card.  ° °Self-Pay (no insurance) in [place]: ° °Organization         Address  Phone     Notes  °Sickle Cell Patients, [facility] [address], [place] (336) 832-1970   °[facility] [address], [place] (336) 832-4400   °[facility] ° [address], [place] (336) 992-4800   °[facility]/Dr. Osei-Bonsu ° [address] [place] [address], [place] or [address], [place] (336) 841-8500 Phone number for both [place] and [place] locations is the same.  °[facility] [address], [place] (336) 299-0000   °[facility] [address] [place] [address], [place] or [address] (336) 852-7530 °(336) 878-2260   °[facility] [address], [place] (336) 350-1642, phone; (336) 294-5005, fax Sees patients 1st and 3rd Saturday of every month.  Must not qualify for public or private insurance (i.e. Medicaid, Medicare, [place] Health Choice, Veterans' Benefits) • Household income should be no more than 200% of the poverty level •The clinic cannot treat you if you are pregnant or  think you are pregnant • Sexually transmitted diseases are not treated at the clinic.  ° ° °Dental Care: °Organization         Address  Phone  Notes  °[facility] [address], [place] (336) 641-6152 Accepts children up to age 21 who are enrolled in Medicaid or [place] Health Choice; pregnant women with a Medicaid card; and children who have applied for Medicaid or [place] Health Choice, but were declined, whose parents can pay a reduced fee at time of service.  °[facility] [place]  [address], [place] (336) 641-7733 Accepts children up to age 21 who are enrolled in Medicaid or [place] Health Choice; pregnant women with a Medicaid card; and children who have applied for Medicaid or [place] Health Choice, but were declined, whose parents can pay a reduced fee at time of service.  °[facility] ° [address], [place] (336) 641-4533 Patients are seen by appointment only. Walk-ins are not accepted. [facility] will see patients 18 years of age and older. °Monday - Tuesday (8am-5pm) °Most Wednesdays (8:30-5pm) °$30 per visit, cash only  °[facility] ° [address], [place] (336) 641-4533 Patients are seen by appointment only. Walk-ins are not accepted. [facility] will see patients 18 years of age and older. °One Wednesday Evening (Monthly: Volunteer Based).  $30 per visit, cash only  °[facility]  (919) 537-3737 for adults; Children under age 4, call Graduate Pediatric Dentistry at ([phone]) [phone]. Children aged 4-14, please call (919) [phone] to request a pediatric application. ° Dental services are provided in all areas of dental care including fillings, crowns and bridges, complete and partial dentures, implants, gum treatment, root canals, and extractions. Preventive care is also provided. Treatment is provided to both adults  and children. °Patients are selected via a lottery and there is often a waiting list. °  °[facility] [address], ° ° (336) 763-8833 [email or web address].[email or web address] °  °[facility] [address], [place], [place] (336)723-1848, Ext. 123 Second and Fourth Thursday of each month, opens at 6:30 AM; Clinic ends at 9 AM.  Patients are seen on a first-come first-served basis, and a limited number are seen during each clinic.  ° °[facility] ° [address], [place], [place] (336) 723-7904     Eligibility Requirements °You must have lived in [place], [place], or [place] counties for at least the last three months. °  You cannot be eligible for state or federal sponsored healthcare insurance, including Veterans Administration, Medicaid, or Medicare. °  You generally cannot be eligible for healthcare insurance through your employer.  °  How to apply: °Eligibility screenings are held every Tuesday and Wednesday afternoon from 1:00 pm until 4:00 pm. You do not need an appointment for the interview!  °[facility] [address], [place], [place] 336-631-2330   °[facility] [facility]  336-342-8273   °[facility]  336-703-3100   °[facility]  336-570-6415   ° °Behavioral Health Resources in the Community: °Intensive Outpatient Programs °Organization         Address  Phone  Notes  °[facility] [address], [place], [place] 336-878-6098   °[facility] [address], [place], [place] 336-832-9800   °[facility] [address], [place], [place] ° 336-882-2125   °[facility] [address],  °[place], [place] 1-800-853-5163 or 336-641-4981   °Substance Abuse Resources °Organization         Address  Phone  Notes  °[facility]  336-882-2125   °[facility]  336-784-9470   °[facility]  336-285-9073     °[facility]  336-845-3988   °Residential & Outpatient Substance Abuse Program  1-800-659-3381   °Psychological Services °Organization         Address  Phone  Notes  °[facility]  336- 832-9600   °[facility]  336- 378-7881   °[facility] [address], [place] 1-800-853-5163 or 336-641-4981   ° °Mobile Crisis Teams °Organization         Address  Phone  Notes  °[facility], Mobile Crisis Care Unit  1-877-626-1772   °Assertive °[facility] ° [address] [place], [place] 336-834-9664   °Sharon DeEsch [address] °[place] 336-554-5454   ° °Self-Help/Support Groups °Organization         Address  Phone             Notes  °[facility] - variety of support groups  336- 373-1402 Call for more information  °Narcotics Anonymous (NA), [facility] [address], °[place]  2 meetings at this location  ° °Residential Treatment Programs °Organization         Address  Phone  Notes  °[facility] [address],    °[place]  1-866-801-8205   °[facility] ° [address], [place], [place] 704-293-8524   °[facility] [address], [place] 336-845-3988 Admissions: 8am-3pm M-F  °[facility] [address]    °[place], [place] 336-841-1104   °[facility] [address], [place], [place] 336-379-7146   °[facility] [address]  °[place], [place] 336-285-9073   °[facility] - Intensive Outpatient [address], [place], [place] 336-852-3033   °[facility] [address]  °[place], [place] 1-877-615-2722 or 336-784-9470   °[facility] [address] [place], [place] 336-227-7417 Accepts Medicaid  °[facility] [address]  °[place] 1-800-659-3381 Substance Abuse/Addiction Treatment  ° °[place]  Behavioral Health Resources °Organization           Address  Phone  Notes  °[facility]  (888) 581-9988   °Julie Brannon, PhD [address] [place], [place]   (336) 349-5553 or (336) 951-0000   °[facility]   [address] °[place], [place] (336) 349-4454   °[facility] [address], [place], [place] (336) 342-8316 Insurance/Medicaid/sponsorship through [facility]  °[facility] [address]                                    [place], [place] (336) 342-8316 Therapy/tele-psych/case  °[facility] [address]  ° [place], [place] (336) 349-2233    °Dr. Arfeen  (336) 349-4544   °[facility] [place]  [employer] [place] [facility] 1) [address], [place] °2) [address], [place] °3)  [address], [place] (336) 349-3220 °(336) 342-7768 ° °(336) 342-8140   °[place] [facility] (336) 342-1394 or (336) 342-3537 (After Hours)    ° ° ° °

## 2014-02-10 NOTE — ED Notes (Signed)
Patient is alert and oriented x3.  She was given DC instructions and follow up visit instructions.  Patient gave verbal understanding. She was DC ambulatory under her own power to home.  V/S stable.  He was not showing any signs of distress on DC 

## 2014-02-11 NOTE — ED Provider Notes (Signed)
Medical screening examination/treatment/procedure(s) were performed by non-physician practitioner and as supervising physician I was immediately available for consultation/collaboration.   EKG Interpretation   Date/Time:  Wednesday February 09 2014 21:54:49 EDT Ventricular Rate:  73 PR Interval:  218 QRS Duration: 88 QT Interval:  393 QTC Calculation: 433 R Axis:   74 Text Interpretation:  Sinus rhythm Prolonged PR interval Early  repolarization pattern No significant change since last tracing 02 Sep 2013 Confirmed by [facility]  MD-I, Dyllan Hughett ([id number]) on 02/10/2014 12:00:45 AM      Devoria Albe, MD, Franz Dell, MD 02/11/14 [phone]

## 2014-03-21 ENCOUNTER — Encounter (HOSPITAL_COMMUNITY): Payer: Self-pay | Admitting: Emergency Medicine

## 2014-10-04 ENCOUNTER — Emergency Department (HOSPITAL_COMMUNITY)
Admission: EM | Admit: 2014-10-04 | Discharge: 2014-10-04 | Disposition: A | Discharge status: Home / Self Care | Payer: Medicaid Other | Attending: Emergency Medicine | Admitting: Emergency Medicine

## 2014-10-04 ENCOUNTER — Encounter (HOSPITAL_COMMUNITY): Payer: Self-pay

## 2014-10-04 ENCOUNTER — Emergency Department (HOSPITAL_BASED_OUTPATIENT_CLINIC_OR_DEPARTMENT_OTHER)
Admit: 2014-10-04 | Discharge: 2014-10-04 | Disposition: A | Discharge status: Home / Self Care | Payer: Medicaid Other | Attending: Emergency Medicine | Admitting: Emergency Medicine

## 2014-10-04 DIAGNOSIS — O9989 Other specified diseases and conditions complicating pregnancy, childbirth and the puerperium: Secondary | ICD-10-CM | POA: Insufficient documentation

## 2014-10-04 DIAGNOSIS — R011 Cardiac murmur, unspecified: Secondary | ICD-10-CM | POA: Insufficient documentation

## 2014-10-04 DIAGNOSIS — Z8619 Personal history of other infectious and parasitic diseases: Secondary | ICD-10-CM | POA: Diagnosis not present

## 2014-10-04 DIAGNOSIS — Z87440 Personal history of urinary (tract) infections: Secondary | ICD-10-CM | POA: Insufficient documentation

## 2014-10-04 DIAGNOSIS — Z3A25 25 weeks gestation of pregnancy: Secondary | ICD-10-CM | POA: Diagnosis not present

## 2014-10-04 DIAGNOSIS — Z87891 Personal history of nicotine dependence: Secondary | ICD-10-CM | POA: Diagnosis not present

## 2014-10-04 DIAGNOSIS — M7989 Other specified soft tissue disorders: Secondary | ICD-10-CM

## 2014-10-04 DIAGNOSIS — R600 Localized edema: Secondary | ICD-10-CM

## 2014-10-04 DIAGNOSIS — Z8659 Personal history of other mental and behavioral disorders: Secondary | ICD-10-CM | POA: Diagnosis not present

## 2014-10-04 DIAGNOSIS — O99012 Anemia complicating pregnancy, second trimester: Secondary | ICD-10-CM | POA: Diagnosis not present

## 2014-10-04 DIAGNOSIS — Z79899 Other long term (current) drug therapy: Secondary | ICD-10-CM | POA: Insufficient documentation

## 2014-10-04 DIAGNOSIS — D649 Anemia, unspecified: Secondary | ICD-10-CM | POA: Diagnosis not present

## 2014-10-04 NOTE — ED Provider Notes (Signed)
CSN: [id number]     Arrival date & time 10/04/14  1001 History   First MD Initiated Contact with Patient 10/04/14 1024     Chief Complaint  Patient presents with  . Foot Pain  . Leg Pain     (Consider location/radiation/quality/duration/timing/severity/associated sxs/prior Treatment) HPI Patient presents with concern of ongoing left lower extremity pain, swelling. Patient has a history of trauma about one year ago, when she stepped into a small hole, felt her foot bent awkwardly. Since that time there's been pain persistently in the foot, but today she complains of increased swelling throughout the leg inferior to the knee, diffuse pain, both with pressure and motion. No relief with Tylenol, elevation. Patient is pregnant. She is 6 months into the pregnancy, unremarkable thus far. She has 5 prior C-section. Today due to increased pain, swelling, she presents for evaluation. Past Medical History  Diagnosis Date  . Heart murmur   . Anemia   . Urinary tract infection   . Gonorrhea   . Depression     refuses to take meds   Past Surgical History  Procedure Laterality Date  . Cesarean section      X 5   Family History  Problem Relation Age of Onset  . Other Neg Hx   . Hypertension Other    History  Substance Use Topics  . Smoking status: Former Smoker    Quit date: 12/30/2011  . Smokeless tobacco: Never Used  . Alcohol Use: Yes     Comment: occasional   OB History    Gravida Para Term Preterm AB TAB SAB Ectopic Multiple Living   7 6 5 1      6      Review of Systems  Constitutional:       Per HPI, otherwise negative  HENT:       Per HPI, otherwise negative  Respiratory:       Per HPI, otherwise negative  Cardiovascular:       Per HPI, otherwise negative  Gastrointestinal: Negative for vomiting.  Endocrine:       Negative aside from HPI  Genitourinary:       Neg aside from HPI   Musculoskeletal:       Per HPI, otherwise negative  Skin: Negative.    Neurological: Negative for syncope.      Allergies  Review of patient's allergies indicates no known allergies.  Home Medications   Prior to Admission medications   Medication Sig Start Date End Date Taking? Authorizing Provider  Prenatal Vit-Fe Fumarate-FA (PRENATAL MULTIVITAMIN) TABS tablet Take 1 tablet by mouth daily at 12 noon.   Yes Historical Provider, MD   BP 137/81 mmHg  Pulse 102  Temp(Src) 98.4 F (36.9 C) (Oral)  Resp 16  SpO2 98%  LMP 04/13/2014 Physical Exam  Constitutional: She is oriented to person, place, and time. She appears well-developed and well-nourished. No distress.  HENT:  Head: Normocephalic and atraumatic.  Eyes: Conjunctivae and EOM are normal.  Cardiovascular: Normal rate and regular rhythm.   Pulmonary/Chest: Effort normal and breath sounds normal. No stridor. No respiratory distress.  Abdominal: She exhibits no distension.  Musculoskeletal: She exhibits edema.  Diffuse edema, swelling inferior to the left knee, asymmetrically compared to right. Tenderness to palpation throughout the dorsal surface of the foot. Appreciable pulses, and range of motion are both  Neurological: She is alert and oriented to person, place, and time. No cranial nerve deficit.  Skin: Skin is warm and dry.  Psychiatric:  She has a normal mood and affect.  Nursing note and vitals reviewed.   ED Course  Procedures (including critical care time) Pulse oximetry 99% room air normal  I reviewed the results (including imaging as performed), agree with the interpretation  On repeat exam the patient appears better.  We reviewed all findings.  MDM   Final diagnoses:  Edema of left lower extremity   Patient presents with concern of ongoing swelling, pain about the left distal lower extremity. Given the patient's risk factor for DVT, including relative immobilization, pregnancy, ultrasound performed. This was reassuring. Patient does have history of minor, in the  past, but no evidence for neurovascular compromise here. Patient will follow-up with orthopedics, upon completion of pregnancy. Patient had return precautions were provided, home care instructions discussed, and was discharged in stable condition.  Gerhard Munchobert Javed Cotto, MD 10/04/14 1302

## 2014-10-04 NOTE — ED Notes (Signed)
Pt is 6 months pregnant.  Pt injured left foot x 1 year ago but did not have injury checked.  Pt states when she is pregnant her feet swell and pain occurs.  Pt having left foot pain and pain is coming up left leg.

## 2014-10-04 NOTE — ED Notes (Signed)
Fetal heart rate detected at 180 via abdominal doppler.  Pt reports feeling fetal movement.

## 2014-10-04 NOTE — ED Notes (Signed)
Doppler being performed at bedside.

## 2014-10-04 NOTE — Discharge Instructions (Signed)
As discussed, your evaluation today has been largely reassuring.  But, it is important that you monitor your condition carefully, and do not hesitate to return to the ED if you develop new, or concerning changes in your condition. ° °Otherwise, please follow-up with your physician for appropriate ongoing care. ° °Peripheral Edema °You have swelling in your legs (peripheral edema). This swelling is due to excess accumulation of salt and water in your body. Edema may be a sign of heart, kidney or liver disease, or a side effect of a medication. It may also be due to problems in the leg veins. Elevating your legs and using special support stockings may be very helpful, if the cause of the swelling is due to poor venous circulation. Avoid long periods of standing, whatever the cause. °Treatment of edema depends on identifying the cause. Chips, pretzels, pickles and other salty foods should be avoided. Restricting salt in your diet is almost always needed. Water pills (diuretics) are often used to remove the excess salt and water from your body via urine. These medicines prevent the kidney from reabsorbing sodium. This increases urine flow. °Diuretic treatment may also result in lowering of potassium levels in your body. Potassium supplements may be needed if you have to use diuretics daily. Daily weights can help you keep track of your progress in clearing your edema. You should call your caregiver for follow up care as recommended. °SEEK IMMEDIATE MEDICAL CARE IF:  °· You have increased swelling, pain, redness, or heat in your legs. °· You develop shortness of breath, especially when lying down. °· You develop chest or abdominal pain, weakness, or fainting. °· You have a fever. °Document Released: 06/13/2004 Document Revised: 07/29/2011 Document Reviewed: 05/24/2009 °ExitCare® Patient Information ©2015 [employer]. This information is not intended to replace advice given to you by your health care provider. Make sure  you discuss any questions you have with your health care provider. ° °

## 2014-10-04 NOTE — Progress Notes (Signed)
*  PRELIMINARY RESULTS* Vascular Ultrasound Left lower extremity venous duplex has been completed.  Preliminary findings: no evidence of DVT  Farrel DemarkJill Eunice, RDMS, RVT  10/04/2014, 11:44 AM

## 2015-04-05 ENCOUNTER — Emergency Department (HOSPITAL_COMMUNITY)
Admission: EM | Admit: 2015-04-05 | Discharge: 2015-04-05 | Disposition: A | Discharge status: Home / Self Care | Payer: Medicaid Other | Attending: Emergency Medicine | Admitting: Emergency Medicine

## 2015-04-05 ENCOUNTER — Encounter (HOSPITAL_COMMUNITY): Payer: Self-pay | Admitting: Emergency Medicine

## 2015-04-05 DIAGNOSIS — R110 Nausea: Secondary | ICD-10-CM | POA: Insufficient documentation

## 2015-04-05 DIAGNOSIS — R011 Cardiac murmur, unspecified: Secondary | ICD-10-CM | POA: Insufficient documentation

## 2015-04-05 DIAGNOSIS — Z87440 Personal history of urinary (tract) infections: Secondary | ICD-10-CM | POA: Insufficient documentation

## 2015-04-05 DIAGNOSIS — M542 Cervicalgia: Secondary | ICD-10-CM | POA: Diagnosis not present

## 2015-04-05 DIAGNOSIS — Z8659 Personal history of other mental and behavioral disorders: Secondary | ICD-10-CM | POA: Insufficient documentation

## 2015-04-05 DIAGNOSIS — R51 Headache: Secondary | ICD-10-CM | POA: Diagnosis not present

## 2015-04-05 DIAGNOSIS — Z8619 Personal history of other infectious and parasitic diseases: Secondary | ICD-10-CM | POA: Diagnosis not present

## 2015-04-05 DIAGNOSIS — Z862 Personal history of diseases of the blood and blood-forming organs and certain disorders involving the immune mechanism: Secondary | ICD-10-CM | POA: Insufficient documentation

## 2015-04-05 DIAGNOSIS — Z87891 Personal history of nicotine dependence: Secondary | ICD-10-CM | POA: Insufficient documentation

## 2015-04-05 DIAGNOSIS — R519 Headache, unspecified: Secondary | ICD-10-CM

## 2015-04-05 LAB — URINALYSIS, ROUTINE W REFLEX MICROSCOPIC
Bilirubin Urine: NEGATIVE
GLUCOSE, UA: NEGATIVE mg/dL
HGB URINE DIPSTICK: NEGATIVE
KETONES UR: NEGATIVE mg/dL
Leukocytes, UA: NEGATIVE
Nitrite: NEGATIVE
PH: 6 (ref 5.0–8.0)
PROTEIN: NEGATIVE mg/dL
Specific Gravity, Urine: 1.03 (ref 1.005–1.030)

## 2015-04-05 LAB — CBC
HCT: 38.5 % (ref 36.0–46.0)
Hemoglobin: 12.3 g/dL (ref 12.0–15.0)
MCH: 28.5 pg (ref 26.0–34.0)
MCHC: 31.9 g/dL (ref 30.0–36.0)
MCV: 89.1 fL (ref 78.0–100.0)
Platelets: 352 10*3/uL (ref 150–400)
RBC: 4.32 MIL/uL (ref 3.87–5.11)
RDW: 15.8 % — AB (ref 11.5–15.5)
WBC: 6.3 10*3/uL (ref 4.0–10.5)

## 2015-04-05 LAB — COMPREHENSIVE METABOLIC PANEL
ALT: 18 U/L (ref 14–54)
AST: 23 U/L (ref 15–41)
Albumin: 4.1 g/dL (ref 3.5–5.0)
Alkaline Phosphatase: 97 U/L (ref 38–126)
Anion gap: 9 (ref 5–15)
BILIRUBIN TOTAL: 0.5 mg/dL (ref 0.3–1.2)
BUN: 14 mg/dL (ref 6–20)
CO2: 29 mmol/L (ref 22–32)
Calcium: 9.6 mg/dL (ref 8.9–10.3)
Chloride: 101 mmol/L (ref 101–111)
Creatinine, Ser: 0.73 mg/dL (ref 0.44–1.00)
GFR calc Af Amer: >60 mL/min (ref >60)
GFR calc non Af Amer: >60 mL/min (ref >60)
GLUCOSE: 98 mg/dL (ref 65–99)
POTASSIUM: 4.1 mmol/L (ref 3.5–5.1)
Sodium: 139 mmol/L (ref 135–145)
TOTAL PROTEIN: 8.1 g/dL (ref 6.5–8.1)

## 2015-04-05 LAB — LIPASE, BLOOD: Lipase: 26 U/L (ref 11–51)

## 2015-04-05 MED ORDER — ACETAMINOPHEN 325 MG PO TABS
650.0000 mg | ORAL_TABLET | Freq: Once | ORAL | Status: AC
  Administered 2015-04-05: 650 mg via ORAL
  Filled 2015-04-05: qty 2

## 2015-04-05 NOTE — ED Provider Notes (Signed)
CSN: [id number]     Arrival date & time 04/05/15  1829 History   First MD Initiated Contact with Patient 04/05/15 2112     Chief Complaint  Patient presents with  . Headache  . Neck Pain  . Emesis     (Consider location/radiation/quality/duration/timing/severity/associated sxs/prior Treatment) HPI Heather Lynch is a 29 y.o. female because in for evaluation of headache, neck pain, nausea. Patient reports her kids were sick last week with similar symptoms and they began to feel better after 3 days. Patient is concerned because her symptoms are not fully resolved. She does report feeling much better today, "but not completely better". She characterizes her headache as "a tight sensation around her forehead, like someone punched me in the head". She reports she has not taken anything for her symptoms at this time. She reports she is breast-feeding now and did not want to take anything that may interfere with her lactation. She also reports she has not eaten anything today and only had a half a cup of chicken soup yesterday. She denies any other abdominal pain, diarrhea, constipation, pelvic pain, vaginal discharge or bleeding, rash. No recent travel, other environmental exposures.  Past Medical History  Diagnosis Date  . Heart murmur   . Anemia   . Urinary tract infection   . Gonorrhea   . Depression     refuses to take meds   Past Surgical History  Procedure Laterality Date  . Cesarean section      X 5   Family History  Problem Relation Age of Onset  . Other Neg Hx   . Hypertension Other    Social History  Substance Use Topics  . Smoking status: Former Smoker    Quit date: 12/30/2011  . Smokeless tobacco: Never Used  . Alcohol Use: Yes     Comment: occasional   OB History    Gravida Para Term Preterm AB TAB SAB Ectopic Multiple Living   Review of Systems A 10 point review of systems was completed and was negative except for pertinent positives and  negatives as mentioned in the history of present illness     Allergies  Review of patient's allergies indicates no known allergies.  Home Medications   Prior to Admission medications   Not on File   BP 125/77 mmHg  Pulse 85  Temp(Src) 98.7 F (37.1 C) (Oral)  Resp 20  SpO2 99%  LMP 04/13/2014  Breastfeeding? Unknown Physical Exam  Constitutional: She is oriented to person, place, and time. She appears well-developed and well-nourished.  Well-appearing African-American female  HENT:  Head: Normocephalic and atraumatic.  Mouth/Throat: Oropharynx is clear and moist.  Eyes: Conjunctivae are normal. Pupils are equal, round, and reactive to light. Right eye exhibits no discharge. Left eye exhibits no discharge. No scleral icterus.  Neck: Normal range of motion. Neck supple.  No meningismus or nuchal rigidity  Cardiovascular: Normal rate, regular rhythm and normal heart sounds.   Pulmonary/Chest: Effort normal and breath sounds normal. No respiratory distress. She has no wheezes. She has no rales.  Abdominal: Soft. There is no tenderness.  Musculoskeletal: She exhibits no tenderness.  Neurological: She is alert and oriented to person, place, and time.  Cranial Nerves II-XII grossly intact. Motor strength is 5/5 in all 4 extremities. Sensation intact to light touch. Gait is baseline without ataxia.  Skin: Skin is warm and dry. No rash noted.  Psychiatric: She  has a normal mood and affect.  Nursing note and vitals reviewed.   ED Course  Procedures (including critical care time) Labs Review Labs Reviewed  CBC - Abnormal; Notable for the following:    RDW 15.8 (*)    All other components within normal limits  LIPASE, BLOOD  COMPREHENSIVE METABOLIC PANEL  URINALYSIS, ROUTINE W REFLEX MICROSCOPIC (NOT [facility])    Imaging Review No results found. I have personally reviewed and evaluated these images and lab results as part of my medical decision-making.   EKG  Interpretation None     Meds given in ED:  Medications  acetaminophen (TYLENOL) tablet 650 mg (not administered)    New Prescriptions   No medications on file   Filed Vitals:   04/05/15 1840  BP: 125/77  Pulse: 85  Temp: 98.7 F (37.1 C)  TempSrc: Oral  Resp: 20  SpO2: 99%    MDM  Vitals stable - WNL -afebrile Pt resting comfortably in ED. PE--nonfocal neuro exam, no meningismus or nuchal rigidity. Gait is baseline. Physical exam is grossly unremarkable. Labwork-labs are unremarkable.  DDX--patient reports she is feeling much better without intervention and states she is ready to go home. Low suspicion at this time for meningitis, CVA or other central lesion. Discussed Tylenol at home and patient agrees with this plan. Given [facility].  I discussed all relevant lab findings and imaging results with pt and they verbalized understanding. Discussed f/u with PCP within 48 hrs and return precautions, pt very amenable to plan. Overall, patient appears very well, nontoxic, hemodynamically stable and appropriate for discharge. Final diagnoses:  Nonintractable headache, unspecified chronicity pattern, unspecified headache type       Joycie PeekBenjamin Jalexa Pifer, PA-C 04/06/15 0006  Benjiman CoreNathan Pickering, MD 04/06/15 [phone]

## 2015-04-05 NOTE — Discharge Instructions (Signed)
There is not appear to be an emergent cause for your symptoms at this time. Please follow-up with your doctor as needed for reevaluation. Your symptoms will likely resolve on their own. It is important for you to eat and stay well-hydrated. Return to ED for any new or worsening symptoms.  General Headache Without Cause A headache is pain or discomfort felt around the head or neck area. The specific cause of a headache may not be found. There are many causes and types of headaches. A few common ones are:  Tension headaches.  Migraine headaches.  Cluster headaches.  Chronic daily headaches. HOME CARE INSTRUCTIONS  Watch your condition for any changes. Take these steps to help with your condition: Managing Pain  Take over-the-counter and prescription medicines only as told by your health care provider.  Lie down in a dark, quiet room when you have a headache.  If directed, apply ice to the head and neck area:  Put ice in a plastic bag.  Place a towel between your skin and the bag.  Leave the ice on for 20 minutes, 2-3 times per day.  Use a heating pad or hot shower to apply heat to the head and neck area as told by your health care provider.  Keep lights dim if bright lights bother you or make your headaches worse. Eating and Drinking  Eat meals on a regular schedule.  Limit alcohol use.  Decrease the amount of caffeine you drink, or stop drinking caffeine. General Instructions  Keep all follow-up visits as told by your health care provider. This is important.  Keep a headache journal to help find out what may trigger your headaches. For example, write down:  What you eat and drink.  How much sleep you get.  Any change to your diet or medicines.  Try massage or other relaxation techniques.  Limit stress.  Sit up straight, and do not tense your muscles.  Do not use tobacco products, including cigarettes, chewing tobacco, or e-cigarettes. If you need help quitting,  ask your health care provider.  Exercise regularly as told by your health care provider.  Sleep on a regular schedule. Get 7-9 hours of sleep, or the amount recommended by your health care provider. SEEK MEDICAL CARE IF:   Your symptoms are not helped by medicine.  You have a headache that is different from the usual headache.  You have nausea or you vomit.  You have a fever. SEEK IMMEDIATE MEDICAL CARE IF:   Your headache becomes severe.  You have repeated vomiting.  You have a stiff neck.  You have a loss of vision.  You have problems with speech.  You have pain in the eye or ear.  You have muscular weakness or loss of muscle control.  You lose your balance or have trouble walking.  You feel faint or pass out.  You have confusion.   This information is not intended to replace advice given to you by your health care provider. Make sure you discuss any questions you have with your health care provider.   Document Released: 05/06/2005 Document Revised: 01/25/2015 Document Reviewed: 08/29/2014 Elsevier Interactive Patient Education [employer]2016 [employer]

## 2015-04-05 NOTE — ED Notes (Signed)
Pt states Monday began feeling neck stiffness and nauseous. Yesterday started with chills, cold sweats, emesis, diarrhea, and a fast feeling heart beat. Pt states today she feels slightly better, but now has a headache with continuing neck stiffness.

## 2015-09-29 ENCOUNTER — Ambulatory Visit (HOSPITAL_COMMUNITY)
Admission: EM | Admit: 2015-09-29 | Discharge: 2015-09-29 | Disposition: A | Discharge status: Home / Self Care | Payer: Self-pay | Attending: Emergency Medicine | Admitting: Emergency Medicine

## 2015-09-29 ENCOUNTER — Ambulatory Visit (HOSPITAL_COMMUNITY)
Admission: EM | Admit: 2015-09-29 | Discharge: 2015-09-29 | Discharge status: Absent without leave | Payer: No Typology Code available for payment source

## 2015-09-29 ENCOUNTER — Encounter (HOSPITAL_COMMUNITY): Payer: Self-pay | Admitting: Emergency Medicine

## 2015-09-29 DIAGNOSIS — N760 Acute vaginitis: Secondary | ICD-10-CM | POA: Insufficient documentation

## 2015-09-29 DIAGNOSIS — A499 Bacterial infection, unspecified: Secondary | ICD-10-CM

## 2015-09-29 DIAGNOSIS — B9689 Other specified bacterial agents as the cause of diseases classified elsewhere: Secondary | ICD-10-CM | POA: Insufficient documentation

## 2015-09-29 DIAGNOSIS — J9801 Acute bronchospasm: Secondary | ICD-10-CM

## 2015-09-29 DIAGNOSIS — R32 Unspecified urinary incontinence: Secondary | ICD-10-CM | POA: Insufficient documentation

## 2015-09-29 DIAGNOSIS — J302 Other seasonal allergic rhinitis: Secondary | ICD-10-CM

## 2015-09-29 DIAGNOSIS — R350 Frequency of micturition: Secondary | ICD-10-CM

## 2015-09-29 DIAGNOSIS — Z87891 Personal history of nicotine dependence: Secondary | ICD-10-CM | POA: Insufficient documentation

## 2015-09-29 LAB — POCT URINALYSIS DIP (DEVICE)
BILIRUBIN URINE: NEGATIVE
Glucose, UA: NEGATIVE mg/dL
KETONES UR: NEGATIVE mg/dL
LEUKOCYTES UA: NEGATIVE
Nitrite: NEGATIVE
PH: 6 (ref 5.0–8.0)
Protein, ur: NEGATIVE mg/dL
Specific Gravity, Urine: 1.025 (ref 1.005–1.030)
Urobilinogen, UA: 0.2 mg/dL (ref 0.0–1.0)

## 2015-09-29 LAB — POCT PREGNANCY, URINE: PREG TEST UR: NEGATIVE

## 2015-09-29 MED ORDER — METRONIDAZOLE 0.75 % VA GEL: VAGINAL | Status: DC

## 2015-09-29 NOTE — ED Notes (Signed)
C/o vag d/c w/foul odor onset x4 days associated w/abd pain and foul urine odor A&O x4... No acute distress.

## 2015-09-29 NOTE — ED Provider Notes (Signed)
CSN: [id number]     Arrival date & time 09/29/15  1852 History   First MD Initiated Contact with Patient 09/29/15 1913     Chief Complaint  Patient presents with  . Vaginal Discharge   (Consider location/radiation/quality/duration/timing/severity/associated sxs/prior Treatment) HPI Comments: 30 year old obese female presents to the urgent care complaining of a vaginal odor and discharge. His also states that she has some urinary urgency and occasional incontinence. She also has occasional burning with urination. Denies other dysuria or frequency. Denies pelvic pain.  Patient is a 30 y.o. female presenting with vaginal discharge.  Vaginal Discharge   Past Medical History  Diagnosis Date  . Heart murmur   . Anemia   . Urinary tract infection   . Gonorrhea   . Depression     refuses to take meds   Past Surgical History  Procedure Laterality Date  . Cesarean section      X 5   Family History  Problem Relation Age of Onset  . Other Neg Hx   . Hypertension Other    Social History  Substance Use Topics  . Smoking status: Former Smoker    Quit date: 12/30/2011  . Smokeless tobacco: Never Used  . Alcohol Use: Yes     Comment: occasional   OB History    Gravida Para Term Preterm AB TAB SAB Ectopic Multiple Living   Review of Systems  Constitutional: Negative.   HENT: Negative.   Respiratory: Negative.   Genitourinary: Positive for urgency and vaginal discharge. Negative for frequency and flank pain.  Musculoskeletal: Negative.   Neurological: Negative.   Hematological: Negative.   All other systems reviewed and are negative.   Allergies  Review of patient's allergies indicates no known allergies.  Home Medications   Prior to Admission medications   Medication Sig Start Date End Date Taking? Authorizing Provider  metroNIDAZOLE (METROGEL) 0.75 % vaginal gel 1 applicator full pv bid x 5 days 09/29/15   Hayden Rasmussen, NP   Meds Ordered and  Administered this Visit  Medications - No data to display  Pulse 75  Temp(Src) 98.5 F (36.9 C) (Oral)  Resp 16  SpO2 100%  Breastfeeding? Yes No data found.   Physical Exam  Constitutional: She is oriented to person, place, and time. She appears well-developed and well-nourished. No distress.  Obese.  Eyes: EOM are normal.  Neck: Normal range of motion. Neck supple.  Cardiovascular: Normal rate.   Pulmonary/Chest: Effort normal. No respiratory distress.  Genitourinary:  Normal external female genitalia There is a moderate amount of thick white discharge coating the vaginal walls. Due to body habitus and redundant vaginal walls unable to reach the cervix with a speculum. This was not visualized. Unable to perform bimanual due to the same reason.  Musculoskeletal: She exhibits no edema.  Neurological: She is alert and oriented to person, place, and time. She exhibits normal muscle tone.  Skin: Skin is warm and dry.  Psychiatric: She has a normal mood and affect.  Nursing note and vitals reviewed.   ED Course  Procedures (including critical care time)  Labs Review Labs Reviewed  POCT URINALYSIS DIP (DEVICE) - Abnormal; Notable for the following:    Hgb urine dipstick SMALL (*)    All other components within normal limits  URINE CULTURE  POCT PREGNANCY, URINE  CERVICOVAGINAL ANCILLARY ONLY   Results for orders placed or performed during the hospital encounter of  09/29/15  POCT urinalysis dip (device)  Result Value Ref Range   Glucose, UA NEGATIVE NEGATIVE mg/dL   Bilirubin Urine NEGATIVE NEGATIVE   Ketones, ur NEGATIVE NEGATIVE mg/dL   Specific Gravity, Urine 1.025 1.005 - 1.030   Hgb urine dipstick SMALL (A) NEGATIVE   pH 6.0 5.0 - 8.0   Protein, ur NEGATIVE NEGATIVE mg/dL   Urobilinogen, UA 0.2 0.0 - 1.0 mg/dL   Nitrite NEGATIVE NEGATIVE   Leukocytes, UA NEGATIVE NEGATIVE  Pregnancy, urine POC  Result Value Ref Range   Preg Test, Ur NEGATIVE NEGATIVE      Imaging Review No results found.   Visual Acuity Review  Right Eye Distance:   Left Eye Distance:   Bilateral Distance:    Right Eye Near:   Left Eye Near:    Bilateral Near:         MDM   1. BV (bacterial vaginosis)   2. Vaginitis   3. Urinary incontinence, unspecified incontinence type    Meds ordered this encounter  Medications  . metroNIDAZOLE (METROGEL) 0.75 % vaginal gel    Sig: 1 applicator full pv bid x 5 days    Dispense:  70 g    Refill:  0    Order Specific Question:  Supervising Provider    Answer:  Charm RingsHONIG, ERIN J [[id number]]   Urine culture pending F/u wit your doctor as needed    Hayden RasmussenDavid Levoy Geisen, NP 09/29/15 2045

## 2015-09-29 NOTE — Discharge Instructions (Signed)
Bacterial Vaginosis Bacterial vaginosis is a vaginal infection that occurs when the normal balance of bacteria in the vagina is disrupted. It results from an overgrowth of certain bacteria. This is the most common vaginal infection in women of childbearing age. Treatment is important to prevent complications, especially in pregnant women, as it can cause a premature delivery. CAUSES  Bacterial vaginosis is caused by an increase in harmful bacteria that are normally present in smaller amounts in the vagina. Several different kinds of bacteria can cause bacterial vaginosis. However, the reason that the condition develops is not fully understood. RISK FACTORS Certain activities or behaviors can put you at an increased risk of developing bacterial vaginosis, including:  Having a new sex partner or multiple sex partners.  Douching.  Using an intrauterine device (IUD) for contraception. Women do not get bacterial vaginosis from toilet seats, bedding, swimming pools, or contact with objects around them. SIGNS AND SYMPTOMS  Some women with bacterial vaginosis have no signs or symptoms. Common symptoms include:  Grey vaginal discharge.  A fishlike odor with discharge, especially after sexual intercourse.  Itching or burning of the vagina and vulva.  Burning or pain with urination. DIAGNOSIS  Your health care provider will take a medical history and examine the vagina for signs of bacterial vaginosis. A sample of vaginal fluid may be taken. Your health care provider will look at this sample under a microscope to check for bacteria and abnormal cells. A vaginal pH test may also be done.  TREATMENT  Bacterial vaginosis may be treated with antibiotic medicines. These may be given in the form of a pill or a vaginal cream. A second round of antibiotics may be prescribed if the condition comes back after treatment. Because bacterial vaginosis increases your risk for sexually transmitted diseases, getting  treated can help reduce your risk for chlamydia, gonorrhea, HIV, and herpes. HOME CARE INSTRUCTIONS   Only take over-the-counter or prescription medicines as directed by your health care provider.  If antibiotic medicine was prescribed, take it as directed. Make sure you finish it even if you start to feel better.  Tell all sexual partners that you have a vaginal infection. They should see their health care provider and be treated if they have problems, such as a mild rash or itching.  During treatment, it is important that you follow these instructions:  Avoid sexual activity or use condoms correctly.  Do not douche.  Avoid alcohol as directed by your health care provider.  Avoid breastfeeding as directed by your health care provider. SEEK MEDICAL CARE IF:   Your symptoms are not improving after 3 days of treatment.  You have increased discharge or pain.  You have a fever. MAKE SURE YOU:   Understand these instructions.  Will watch your condition.  Will get help right away if you are not doing well or get worse. FOR MORE INFORMATION  Centers for Disease Control and Prevention, Division of STD Prevention: [employer] American Sexual Health Association (ASHA): www.ashastd.org    This information is not intended to replace advice given to you by your health care provider. Make sure you discuss any questions you have with your health care provider.   Document Released: 05/06/2005 Document Revised: 05/27/2014 Document Reviewed: 12/16/2012 Elsevier Interactive Patient Education 2016 Elsevier Inc.  Urinary Incontinence Urinary incontinence is the involuntary loss of urine from your bladder. CAUSES  There are many causes of urinary incontinence. They include:  Medicines.  Infections.  Prostatic enlargement, leading to overflow of urine  from your bladder.  Surgery.  Neurological diseases.  Emotional factors. SIGNS AND SYMPTOMS Urinary Incontinence can be divided  into four types:  Urge incontinence. Urge incontinence is the involuntary loss of urine before you have the opportunity to go to the bathroom. There is a sudden urge to void but not enough time to reach a bathroom.  Stress incontinence. Stress incontinence is the sudden loss of urine with any activity that forces urine to pass. It is commonly caused by anatomical changes to the pelvis and sphincter areas of your body.  Overflow incontinence. Overflow incontinence is the loss of urine from an obstructed opening to your bladder. This results in a backup of urine and a resultant buildup of pressure within the bladder. When the pressure within the bladder exceeds the closing pressure of the sphincter, the urine overflows, which causes incontinence, similar to water overflowing a dam.  Total incontinence. Total incontinence is the loss of urine as a result of the inability to store urine within your bladder. DIAGNOSIS  Evaluating the cause of incontinence may require:  A thorough and complete medical and obstetric history.  A complete physical exam.  Laboratory tests such as a urine culture and sensitivities. When additional tests are indicated, they can include:  An ultrasound exam.  Kidney and bladder X-rays.  Cystoscopy. This is an exam of the bladder using a narrow scope.  Urodynamic testing to test the nerve function to the bladder and sphincter areas. TREATMENT  Treatment for urinary incontinence depends on the cause:  For urge incontinence caused by a bacterial infection, antibiotics will be prescribed. If the urge incontinence is related to medicines you take, your health care provider may have you change the medicine.  For stress incontinence, surgery to re-establish anatomical support to the bladder or sphincter, or both, will often correct the condition.  For overflow incontinence caused by an enlarged prostate, an operation to open the channel through the enlarged prostate  will allow the flow of urine out of the bladder. In women with fibroids, a hysterectomy may be recommended.  For total incontinence, surgery on your urinary sphincter may help. An artificial urinary sphincter (an inflatable cuff placed around the urethra) may be required. In women who have developed a hole-like passage between their bladder and vagina (vesicovaginal fistula), surgery to close the fistula often is required. HOME CARE INSTRUCTIONS  Normal daily hygiene and the use of pads or adult diapers that are changed regularly will help prevent odors and skin damage.  Avoid caffeine. It can overstimulate your bladder.  Use the bathroom regularly. Try about every 2-3 hours to go to the bathroom, even if you do not feel the need to do so. Take time to empty your bladder completely. After urinating, wait a minute. Then try to urinate again.  For causes involving nerve dysfunction, keep a log of the medicines you take and a journal of the times you go to the bathroom. SEEK MEDICAL CARE IF:  You experience worsening of pain instead of improvement in pain after your procedure.  Your incontinence becomes worse instead of better. SEE IMMEDIATE MEDICAL CARE IF:  You experience fever or shaking chills.  You are unable to pass your urine.  You have redness spreading into your groin or down into your thighs. MAKE SURE YOU:   Understand these instructions.   Will watch your condition.  Will get help right away if you are not doing well or get worse.   This information is not intended to  replace advice given to you by your health care provider. Make sure you discuss any questions you have with your health care provider.   Document Released: 06/13/2004 Document Revised: 05/27/2014 Document Reviewed: 10/13/2012 Elsevier Interactive Patient Education 2016 [employer]  Vaginitis Vaginitis is an inflammation of the vagina. It can happen when the normal bacteria and yeast in the vagina grow  too much. There are different types. Treatment will depend on the type you have. HOME CARE  Take all medicines as told by your doctor.  Keep your vagina area clean and dry. Avoid soap. Rinse the area with water.  Avoid washing and cleaning out the vagina (douching).  Do not use tampons or have sex (intercourse) until your treatment is done.  Wipe from front to back after going to the restroom.  Wear cotton underwear.  Avoid wearing underwear while you sleep until your vaginitis is gone.  Avoid tight pants. Avoid underwear or nylons without a cotton panel.  Take off wet clothing (such as a bathing suit) as soon as you can.  Use mild, unscented products. Avoid fabric softeners and scented:  Feminine sprays.  Laundry detergents.  Tampons.  Soaps or bubble baths.  Practice safe sex and use condoms. GET HELP RIGHT AWAY IF:   You have belly (abdominal) pain.  You have a fever or lasting symptoms for more than 2-3 days.  You have a fever and your symptoms suddenly get worse. MAKE SURE YOU:   Understand these instructions.  Will watch this condition.  Will get help right away if you are not doing well or get worse.   This information is not intended to replace advice given to you by your health care provider. Make sure you discuss any questions you have with your health care provider.   Document Released: 08/02/2008 Document Revised: 01/29/2012 Document Reviewed: 10/17/2011 Elsevier Interactive Patient Education [employer] Elsevier Inc.

## 2015-10-02 LAB — URINE CULTURE: Culture: 60000 — AB

## 2015-10-02 LAB — CERVICOVAGINAL ANCILLARY ONLY
CHLAMYDIA, DNA PROBE: NEGATIVE
Neisseria Gonorrhea: NEGATIVE

## 2015-10-03 ENCOUNTER — Telehealth: Payer: Self-pay | Admitting: Internal Medicine

## 2015-10-03 ENCOUNTER — Telehealth (HOSPITAL_COMMUNITY): Payer: Self-pay | Admitting: Emergency Medicine

## 2015-10-03 LAB — CERVICOVAGINAL ANCILLARY ONLY: WET PREP (BD AFFIRM): NEGATIVE

## 2015-10-03 MED ORDER — CEPHALEXIN 500 MG PO CAPS: 500.0000 mg | ORAL_CAPSULE | Freq: Two times a day (BID) | ORAL | Status: AC

## 2015-10-03 MED ORDER — METRONIDAZOLE 500 MG PO TABS: 500.0000 mg | ORAL_TABLET | Freq: Two times a day (BID) | ORAL | Status: DC

## 2015-10-03 NOTE — ED Notes (Signed)
Please let patient know that urine cx is positive for E coli.  Rx cephalexin sent to [facility] record, [facility] [address] [facility] [place].  Recheck for persistent symptoms.  LM  Eustace MooreLaura W Rylei Masella, MD 10/03/15 [phone]

## 2015-10-03 NOTE — ED Notes (Signed)
Called pt and notified of recent lab results from visit 5/12 Pt ID'd properly... Pt wanted to know if cephalexin is safe to take while breastfeeding... Per Teena Iraniavid M, NP... It is safe to take Per Teena Iraniavid M, NP... Change metrogel to Metronidazole 500 mg BID x7 days 14 to be dispensed w/no refills... Pt is not to breastfeed x10 days  Per pt's request, [facility] to [facility]  Per Dr. Dayton ScrapeMurray,   Clinical staff, please let patient know that urine cx is positive for E coli. Rx cephalexin sent to [facility] record[facility] [address] [facility] [place]. Recheck for persistent symptoms. LM Notes Recorded by Eustace MooreLaura W Murray, MD on 10/01/2015 at 8:51 AM [facility]. LM  Adv pt if sx are not getting better to return  Pt verb understanding

## 2015-11-01 ENCOUNTER — Encounter (HOSPITAL_COMMUNITY): Payer: Self-pay | Admitting: Emergency Medicine

## 2015-11-01 ENCOUNTER — Emergency Department (HOSPITAL_COMMUNITY): Payer: Self-pay

## 2015-11-01 DIAGNOSIS — Z87891 Personal history of nicotine dependence: Secondary | ICD-10-CM | POA: Insufficient documentation

## 2015-11-01 DIAGNOSIS — F329 Major depressive disorder, single episode, unspecified: Secondary | ICD-10-CM | POA: Insufficient documentation

## 2015-11-01 DIAGNOSIS — J189 Pneumonia, unspecified organism: Secondary | ICD-10-CM | POA: Insufficient documentation

## 2015-11-01 LAB — BASIC METABOLIC PANEL
Anion gap: 10 (ref 5–15)
BUN: 8 mg/dL (ref 6–20)
CALCIUM: 9.1 mg/dL (ref 8.9–10.3)
CO2: 25 mmol/L (ref 22–32)
Chloride: 103 mmol/L (ref 101–111)
Creatinine, Ser: 0.7 mg/dL (ref 0.44–1.00)
GFR calc Af Amer: >60 mL/min (ref >60)
Glucose, Bld: 102 mg/dL — ABNORMAL HIGH (ref 65–99)
Potassium: 3.9 mmol/L (ref 3.5–5.1)
SODIUM: 138 mmol/L (ref 135–145)

## 2015-11-01 LAB — CBC
HCT: 36.2 % (ref 36.0–46.0)
Hemoglobin: 11.3 g/dL — ABNORMAL LOW (ref 12.0–15.0)
MCH: 28.4 pg (ref 26.0–34.0)
MCHC: 31.2 g/dL (ref 30.0–36.0)
MCV: 91 fL (ref 78.0–100.0)
Platelets: 344 10*3/uL (ref 150–400)
RBC: 3.98 MIL/uL (ref 3.87–5.11)
RDW: 13.6 % (ref 11.5–15.5)
WBC: 10.1 10*3/uL (ref 4.0–10.5)

## 2015-11-01 LAB — I-STAT TROPONIN, ED: Troponin i, poc: 0 ng/mL (ref 0.00–0.08)

## 2015-11-01 NOTE — ED Notes (Signed)
C/o sharp epigastric pain that radiates to back with sob, nausea, and vomited x1 since 6:45pm.  Pt states she was diagnosed with pneumonia on Monday.

## 2015-11-02 ENCOUNTER — Encounter (HOSPITAL_COMMUNITY): Payer: Self-pay | Admitting: Radiology

## 2015-11-02 ENCOUNTER — Emergency Department (HOSPITAL_COMMUNITY)
Admission: EM | Admit: 2015-11-02 | Discharge: 2015-11-02 | Disposition: A | Discharge status: Home / Self Care | Payer: Self-pay | Attending: Emergency Medicine | Admitting: Emergency Medicine

## 2015-11-02 ENCOUNTER — Emergency Department (HOSPITAL_COMMUNITY): Payer: Self-pay

## 2015-11-02 DIAGNOSIS — J189 Pneumonia, unspecified organism: Secondary | ICD-10-CM

## 2015-11-02 HISTORY — DX: Pneumonia, unspecified organism: J18.9

## 2015-11-02 LAB — D-DIMER, QUANTITATIVE: D-Dimer, Quant: 3.1 ug/mL-FEU — ABNORMAL HIGH (ref 0.00–0.50)

## 2015-11-02 LAB — I-STAT BETA HCG BLOOD, ED (MC, WL, AP ONLY)

## 2015-11-02 MED ORDER — IOPAMIDOL (ISOVUE-370) INJECTION 76%
INTRAVENOUS | Status: AC
  Administered 2015-11-02: 65 mL via INTRAVENOUS
  Filled 2015-11-02: qty 100

## 2015-11-02 MED ORDER — IBUPROFEN 800 MG PO TABS
800.0000 mg | ORAL_TABLET | Freq: Once | ORAL | Status: AC
  Administered 2015-11-02: 800 mg via ORAL
  Filled 2015-11-02: qty 1

## 2015-11-02 MED ORDER — LEVOFLOXACIN 500 MG PO TABS: 500.0000 mg | ORAL_TABLET | Freq: Every day | ORAL | Status: DC

## 2015-11-02 NOTE — ED Provider Notes (Signed)
Patient was accepted in sign out from Dr. Bebe ShaggyWickline. I was asked to follow up on the CTA for evaluation for PE. CTA was negative for PE E but again demonstrated right upper lobe pneumonia as well as reactive lymphadenopathy. I discussed this with the patient. After review of the antibiotic choices for her pneumonia it was noted that Levaquin could be given although a small amount of this is excreted in the breast milk. It should still be compatible with breast-feeding now is only 6% is excreted and generally acceptable doses less than 10%. Patient was educated to keep her baby well hydrated and to watch for signs of diarrhea. Patient was discharged home in stable condition.  Leta BaptistEmily Roe Tremell Reimers, MD 11/02/15 [phone]

## 2015-11-02 NOTE — ED Provider Notes (Signed)
CSN: [id number]     Arrival date & time 11/01/15  2120 History  By signing my name below, I, Arianna Nassar, attest that this documentation has been prepared under the direction and in the presence of Zadie Rhine, MD.  Electronically Signed: Octavia Heir, ED Scribe. 11/02/2015. 2:37 AM.      Chief Complaint  Patient presents with  . Abdominal Pain      Patient is a 30 y.o. female presenting with abdominal pain. The history is provided by the patient. No language interpreter was used.  Abdominal Pain Pain location:  Epigastric Pain quality: burning and sharp   Pain radiates to:  Back Pain severity:  Moderate Onset quality:  Sudden Duration:  8 hours Timing:  Intermittent Progression:  Worsening Chronicity:  New Context: recent illness (pneumonia)   Context: not recent travel   Relieved by:  Nothing Worsened by:  Deep breathing and movement Ineffective treatments:  None tried Associated symptoms: nausea, shortness of breath and vomiting   Associated symptoms: no cough and no fever    HPI Comments: Heather Lynch is a 30 y.o. female who has a PMHx of anemia and pneumonia presents to the Emergency Department complaining of sudden onset, intermittent, gradual worsening, moderate sharp epigastric abdominal pain that radiates into her back onset around 6:45 pm. She reports associated nausea, vomiting clear fluid (x1), and shortness of breath when taking a deep breath. Pt says her pain worsens in her substernal chest when she takes a deep breath. Pt states she believed that she was having gas so she drank some ginger ale to alleviate her symptoms with no relief. Pt was diagnosed with pneumonia on 10/30/15 and received a z-pack. She has been compliant and has not missed any doses. She denies fever, hemoptysis, cough, recent travel, birth control, and leg swelling. Pt currently breast feeds.    Past Medical History  Diagnosis Date  . Heart murmur   . Anemia   . Urinary tract  infection   . Gonorrhea   . Depression     refuses to take meds  . Pneumonia    Past Surgical History  Procedure Laterality Date  . Cesarean section      X 5   Family History  Problem Relation Age of Onset  . Other Neg Hx   . Hypertension Other    Social History  Substance Use Topics  . Smoking status: Former Smoker    Quit date: 12/30/2011  . Smokeless tobacco: Never Used  . Alcohol Use: Yes     Comment: occasional   OB History    Gravida Para Term Preterm AB TAB SAB Ectopic Multiple Living   7 6 5 1      6      Review of Systems  Constitutional: Negative for fever.  Respiratory: Positive for shortness of breath. Negative for cough.   Gastrointestinal: Positive for nausea, vomiting and abdominal pain.  All other systems reviewed and are negative.     Allergies  Review of patient's allergies indicates no known allergies.  Home Medications   Prior to Admission medications   Medication Sig Start Date End Date Taking? Authorizing Provider  metroNIDAZOLE (FLAGYL) 500 MG tablet Take 1 tablet (500 mg total) by mouth 2 (two) times daily. Do not breastfeed for 10 days 10/03/15   Hayden Rasmussen, NP  metroNIDAZOLE (METROGEL) 0.75 % vaginal gel 1 applicator full pv bid x 5 days 09/29/15   Hayden Rasmussen, NP   Triage vitals: BP 117/72 mmHg  Pulse  92  Temp(Src) 98.8 F (37.1 C) (Oral)  Resp 20  Wt 326 lb 4.8 oz (148.009 kg)  SpO2 99%  LMP 10/29/2015 (Exact Date) Physical Exam CONSTITUTIONAL: Well developed/well nourished HEAD: Normocephalic/atraumatic EYES: EOMI/PERRL ENMT: Mucous membranes moist NECK: supple no meningeal signs SPINE/BACK:entire spine nontender CV: S1/S2 noted, no murmurs/rubs/gallops noted LUNGS: Lungs are clear to auscultation bilaterally, no apparent distress ABDOMEN: soft, nontender, no rebound or guarding, bowel sounds noted throughout abdomen GU:no cva tenderness NEURO: Pt is awake/alert/appropriate, moves all extremitiesx4.  No facial droop.    EXTREMITIES: pulses normal/equal, full ROM, no calf tenderness or edema SKIN: warm, color normal PSYCH: no abnormalities of mood noted, alert and oriented to situation  ED Course  Procedures  DIAGNOSTIC STUDIES: Oxygen Saturation is 99% on RA, normal by my interpretation.  COORDINATION OF CARE:  2:34 AM Discussed treatment plan which includes lab work with pt at bedside and pt agreed to plan.  Labs Review Labs Reviewed  BASIC METABOLIC PANEL - Abnormal; Notable for the following:    Glucose, Bld 102 (*)    All other components within normal limits  CBC - Abnormal; Notable for the following:    Hemoglobin 11.3 (*)    All other components within normal limits  D-DIMER, QUANTITATIVE (NOT AT [facility]) - Abnormal; Notable for the following:    D-Dimer, Quant 3.10 (*)    All other components within normal limits  I-STAT TROPOININ, ED  I-STAT BETA HCG BLOOD, ED ([facility], [facility], [facility] ONLY)    Imaging Review Dg Chest 2 View  11/01/2015  CLINICAL DATA:  Acute onset of crushing pain between the breasts, extending to the upper back. Shortness of breath. Initial encounter. EXAM: CHEST  2 VIEW COMPARISON:  None. FINDINGS: The lungs are well-aerated. Right upper lobe pneumonia is noted. There is no evidence of pleural effusion or pneumothorax. The heart is normal in size; the mediastinal contour is within normal limits. No acute osseous abnormalities are seen. IMPRESSION: Right upper lobe pneumonia noted. Electronically Signed   By: Roanna RaiderJeffery  Chang M.D.   On: 11/01/2015 22:11   I have personally reviewed and evaluated these images and lab results as part of my medical decision-making.   EKG Interpretation   Date/Time:  Wednesday November 01 2015 21:36:28 EDT Ventricular Rate:  106 PR Interval:  190 QRS Duration: 88 QT Interval:  332 QTC Calculation: 441 R Axis:   68 Text Interpretation:  Sinus tachycardia Nonspecific ST and T wave  abnormality Abnormal ECG t wave changes noted in anterior leads is new   Confirmed by Bebe ShaggyWICKLINE  MD, Kennice Finnie ([id number]) on 11/02/2015 2:20:10 AM     7:43 AM Pt reported onset of pleuritic CP after several days of treatment for pneumonia, she was afebrile without leukocytosis with EKG changes, so concern for PE She was tachycardic so unable to perform [facility] D-dimer elevated, therefore CT imaging ordered CT chest delayed due to loss of IV access Pt stable in the ED, no hypoxia  At sign out to dr Cyndie Chimenguyen, plan to f/u on CT chest If she has PE will need admit If shows only persistent pneumonia she will need another course of antibiotics that are lactation safe   MDM   Final diagnoses:  None    Nursing notes including past medical history and social history reviewed and considered in documentation xrays/imaging reviewed by myself and considered during evaluation Labs/vital reviewed myself and considered during evaluation   I personally performed the services described in this documentation, which was  scribed in my presence. The recorded information has been reviewed and is accurate.      Zadie Rhine, MD 11/02/15 [phone]

## 2015-11-02 NOTE — ED Notes (Signed)
Patient transported to CT 

## 2015-11-02 NOTE — ED Notes (Signed)
Breast pump ordered

## 2015-11-02 NOTE — ED Notes (Signed)
CT resulted.  MD at bedside.

## 2015-11-02 NOTE — Discharge Instructions (Signed)
Your results today show that you continue to have pneumonia on the right side of your lung. You also have associated inflamed lymph nodes. Please take the antibiotics prescribed and follow-up with a primary care physician outpatient. A small amount of antibiotic is excreted in your breast milk. Please monitor your baby for diarrhea. Make sure you keep the baby well hydrated.  Community-Acquired Pneumonia, Adult Pneumonia is an infection of the lungs. One type of pneumonia can happen while a person is in a hospital. A different type can happen when a person is not in a hospital (community-acquired pneumonia). It is easy for this kind to spread from person to person. It can spread to you if you breathe near an infected person who coughs or sneezes. Some symptoms include:  A dry cough.  A wet (productive) cough.  Fever.  Sweating.  Chest pain. HOME CARE  Take over-the-counter and prescription medicines only as told by your doctor.  Only take cough medicine if you are losing sleep.  If you were prescribed an antibiotic medicine, take it as told by your doctor. Do not stop taking the antibiotic even if you start to feel better.  Sleep with your head and neck raised (elevated). You can do this by putting a few pillows under your head, or you can sleep in a recliner.  Do not use tobacco products. These include cigarettes, chewing tobacco, and e-cigarettes. If you need help quitting, ask your doctor.  Drink enough water to keep your pee (urine) clear or pale yellow. A shot (vaccine) can help prevent pneumonia. Shots are often suggested for:  People older than 30 years of age.  People older than 30 years of age:  Who are having cancer treatment.  Who have long-term (chronic) lung disease.  Who have problems with their body's defense system (immune system). You may also prevent pneumonia if you take these actions:  Get the flu (influenza) shot every year.  Go to the dentist as often  as told.  Wash your hands often. If soap and water are not available, use hand sanitizer. GET HELP IF:  You have a fever.  You lose sleep because your cough medicine does not help. GET HELP RIGHT AWAY IF:  You are short of breath and it gets worse.  You have more chest pain.  Your sickness gets worse. This is very serious if:  You are an older adult.  Your body's defense system is weak.  You cough up blood.   This information is not intended to replace advice given to you by your health care provider. Make sure you discuss any questions you have with your health care provider.   Document Released: 10/23/2007 Document Revised: 01/25/2015 Document Reviewed: 08/31/2014 Elsevier Interactive Patient Education [employer] Elsevier Inc.

## 2015-11-02 NOTE — ED Notes (Signed)
Bedside report given with Steward DroneBrenda, pt on bed with rails up. Pt transported to CT angio.

## 2016-05-17 ENCOUNTER — Encounter (HOSPITAL_COMMUNITY): Payer: Self-pay | Admitting: Emergency Medicine

## 2016-05-17 ENCOUNTER — Ambulatory Visit (HOSPITAL_COMMUNITY)
Admission: EM | Admit: 2016-05-17 | Discharge: 2016-05-17 | Disposition: A | Discharge status: Home / Self Care | Payer: Medicaid Other | Attending: Family Medicine | Admitting: Family Medicine

## 2016-05-17 DIAGNOSIS — J020 Streptococcal pharyngitis: Secondary | ICD-10-CM

## 2016-05-17 LAB — POCT RAPID STREP A: STREPTOCOCCUS, GROUP A SCREEN (DIRECT): POSITIVE — AB

## 2016-05-17 MED ORDER — AMOXICILLIN 500 MG PO CAPS: 500.0000 mg | ORAL_CAPSULE | Freq: Three times a day (TID) | ORAL | 0 refills | Status: DC

## 2016-05-17 NOTE — ED Provider Notes (Signed)
[facility]    CSN: [id number] Arrival date & time: 05/17/16  1941     History   Chief Complaint No chief complaint on file.   HPI Heather Lynch is a 30 y.o. female.   The history is provided by the patient.  URI  Presenting symptoms: congestion, cough, ear pain, rhinorrhea and sore throat   Presenting symptoms: no fever   Severity:  Mild Onset quality:  Gradual Duration:  1 week Progression:  Unchanged Chronicity:  New Relieved by:  None tried Worsened by:  Nothing Ineffective treatments:  None tried Risk factors: sick contacts     Past Medical History:  Diagnosis Date  . Anemia   . Depression    refuses to take meds  . Gonorrhea   . Heart murmur   . Pneumonia   . Urinary tract infection     There are no active problems to display for this patient.   Past Surgical History:  Procedure Laterality Date  . CESAREAN SECTION     X 5    OB History    Gravida Para Term Preterm AB Living   7 6 5 1   6    SAB TAB Ectopic Multiple Live Births           6       Home Medications    Prior to Admission medications   Medication Sig Start Date End Date Taking? Authorizing Provider  levofloxacin (LEVAQUIN) 500 MG tablet Take 1 tablet (500 mg total) by mouth daily. 11/02/15   Leta BaptistEmily Roe Nguyen, MD  metroNIDAZOLE (FLAGYL) 500 MG tablet Take 1 tablet (500 mg total) by mouth 2 (two) times daily. Do not breastfeed for 10 days Patient not taking: Reported on 11/02/2015 10/03/15   Hayden Rasmussenavid Mabe, NP  metroNIDAZOLE (METROGEL) 0.75 % vaginal gel 1 applicator full pv bid x 5 days Patient not taking: Reported on 11/02/2015 09/29/15   Hayden Rasmussenavid Mabe, NP    Family History Family History  Problem Relation Age of Onset  . Other Neg Hx   . Hypertension Other     Social History Social History  Substance Use Topics  . Smoking status: Former Smoker    Quit date: 12/30/2011  . Smokeless tobacco: Never Used  . Alcohol use Yes     Comment: occasional     Allergies    Patient has no known allergies.   Review of Systems Review of Systems  Constitutional: Negative for fever.  HENT: Positive for congestion, ear pain, postnasal drip, rhinorrhea and sore throat.   Respiratory: Positive for cough.   Cardiovascular: Negative.   Gastrointestinal: Negative.   All other systems reviewed and are negative.    Physical Exam Triage Vital Signs ED Triage Vitals  Enc Vitals Group     BP      Pulse      Resp      Temp      Temp src      SpO2      Weight      Height      Head Circumference      Peak Flow      Pain Score      Pain Loc      Pain Edu?      Excl. in GC?    No data found.   Updated Vital Signs There were no vitals taken for this visit.  Visual Acuity Right Eye Distance:   Left Eye Distance:   Bilateral Distance:  Right Eye Near:   Left Eye Near:    Bilateral Near:     Physical Exam  Constitutional: She is oriented to person, place, and time. She appears well-developed and well-nourished. No distress.  HENT:  Right Ear: External ear normal.  Left Ear: External ear normal.  Mouth/Throat: Mucous membranes are normal. Posterior oropharyngeal edema and posterior oropharyngeal erythema present. No oropharyngeal exudate.  Eyes: Conjunctivae and EOM are normal. Pupils are equal, round, and reactive to light.  Cardiovascular: Normal rate and regular rhythm.   Pulmonary/Chest: Effort normal and breath sounds normal.  Lymphadenopathy:    She has cervical adenopathy.  Neurological: She is alert and oriented to person, place, and time.  Skin: Skin is warm and dry.  Nursing note and vitals reviewed.    UC Treatments / Results  Labs (all labs ordered are listed, but only abnormal results are displayed) Labs Reviewed - No data to display  EKG  EKG Interpretation None       Radiology No results found.  Procedures Procedures (including critical care time)  Medications Ordered in UC Medications - No data to  display   Initial Impression / Assessment and Plan / UC Course  I have reviewed the triage vital signs and the nursing notes.  Pertinent labs & imaging results that were available during my care of the patient were reviewed by me and considered in my medical decision making (see chart for details).  Clinical Course       Final Clinical Impressions(s) / UC Diagnoses   Final diagnoses:  None    New Prescriptions New Prescriptions   No medications on file     Linna HoffJames D Vernon Maish, MD 05/17/16 2133

## 2016-05-17 NOTE — ED Triage Notes (Signed)
Pt complains of a sore throat for three days 

## 2016-05-17 NOTE — Discharge Instructions (Signed)
Drink lots of fluids, take all of medicine, use lozenges as needed.return if needed °

## 2016-06-11 ENCOUNTER — Ambulatory Visit (HOSPITAL_COMMUNITY)
Admission: EM | Admit: 2016-06-11 | Discharge: 2016-06-11 | Disposition: A | Discharge status: Home / Self Care | Payer: Medicaid Other | Attending: Family Medicine | Admitting: Family Medicine

## 2016-06-11 ENCOUNTER — Encounter (HOSPITAL_COMMUNITY): Payer: Self-pay | Admitting: Emergency Medicine

## 2016-06-11 DIAGNOSIS — J020 Streptococcal pharyngitis: Secondary | ICD-10-CM

## 2016-06-11 DIAGNOSIS — B3731 Acute candidiasis of vulva and vagina: Secondary | ICD-10-CM

## 2016-06-11 DIAGNOSIS — B373 Candidiasis of vulva and vagina: Secondary | ICD-10-CM

## 2016-06-11 LAB — POCT RAPID STREP A: Streptococcus, Group A Screen (Direct): POSITIVE — AB

## 2016-06-11 MED ORDER — TERCONAZOLE 80 MG VA SUPP: 80.0000 mg | Freq: Every day | VAGINAL | 0 refills | Status: DC

## 2016-06-11 MED ORDER — FLUCONAZOLE 150 MG PO TABS: 150.0000 mg | ORAL_TABLET | Freq: Once | ORAL | 1 refills | Status: AC

## 2016-06-11 MED ORDER — CEFDINIR 300 MG PO CAPS: 300.0000 mg | ORAL_CAPSULE | Freq: Two times a day (BID) | ORAL | 0 refills | Status: DC

## 2016-06-11 NOTE — ED Provider Notes (Signed)
[facility]    CSN: [id number] Arrival date & time: 06/11/16  1134     History   Chief Complaint Chief Complaint  Patient presents with  . Sore Throat  . Abdominal Cramping    HPI Heather Lynch is a 31 y.o. female.   The history is provided by the patient.  Sore Throat  This is a recurrent problem. The current episode started more than 2 days ago. The problem has been gradually worsening. The symptoms are aggravated by swallowing.    Past Medical History:  Diagnosis Date  . Anemia   . Depression    refuses to take meds  . Gonorrhea   . Heart murmur   . Pneumonia   . Urinary tract infection     There are no active problems to display for this patient.   Past Surgical History:  Procedure Laterality Date  . CESAREAN SECTION     X 5    OB History    Gravida Para Term Preterm AB Living   7 6 5 1   6    SAB TAB Ectopic Multiple Live Births           6       Home Medications    Prior to Admission medications   Medication Sig Start Date End Date Taking? Authorizing Provider  amoxicillin (AMOXIL) 500 MG capsule Take 1 capsule (500 mg total) by mouth 3 (three) times daily. 05/17/16  Yes Linna Hoff, MD  cefdinir (OMNICEF) 300 MG capsule Take 1 capsule (300 mg total) by mouth 2 (two) times daily. 06/11/16   Linna Hoff, MD  terconazole (TERAZOL 3) 80 MG vaginal suppository Place 1 suppository (80 mg total) vaginally at bedtime. 06/11/16   Linna Hoff, MD    Family History Family History  Problem Relation Age of Onset  . Other Neg Hx   . Hypertension Other     Social History Social History  Substance Use Topics  . Smoking status: Former Smoker    Quit date: 12/30/2011  . Smokeless tobacco: Never Used  . Alcohol use Yes     Comment: occasional     Allergies   Patient has no known allergies.   Review of Systems Review of Systems  Constitutional: Positive for fever.  HENT: Positive for sore throat.   Respiratory: Negative.     Cardiovascular: Negative.   Gastrointestinal: Negative.   Genitourinary: Positive for vaginal discharge. Negative for dysuria.  Skin: Negative.   Neurological: Negative.   All other systems reviewed and are negative.    Physical Exam Triage Vital Signs ED Triage Vitals  Enc Vitals Group     BP 06/11/16 1303 117/81     Pulse Rate 06/11/16 1303 71     Resp 06/11/16 1303 20     Temp 06/11/16 1303 98.6 F (37 C)     Temp Source 06/11/16 1303 Oral     SpO2 06/11/16 1303 100 %     Weight --      Height --      Head Circumference --      Peak Flow --      Pain Score 06/11/16 1309 5     Pain Loc --      Pain Edu? --      Excl. in GC? --    No data found.   Updated Vital Signs BP 117/81 (BP Location: Left Arm)   Pulse 71   Temp 98.6 F (37 C) (Oral)  Resp 20   SpO2 100%   Visual Acuity Right Eye Distance:   Left Eye Distance:   Bilateral Distance:    Right Eye Near:   Left Eye Near:    Bilateral Near:     Physical Exam  Constitutional: She is oriented to person, place, and time. She appears well-developed and well-nourished. No distress.  HENT:  Right Ear: External ear normal.  Left Ear: External ear normal.  Mouth/Throat: Oropharyngeal exudate present.  Neck: Normal range of motion.  Cardiovascular: Normal rate, regular rhythm, normal heart sounds and intact distal pulses.   Pulmonary/Chest: Effort normal and breath sounds normal.  Abdominal: Soft. Bowel sounds are normal.  Lymphadenopathy:    She has cervical adenopathy.  Neurological: She is alert and oriented to person, place, and time.  Skin: Skin is warm and dry.  Nursing note and vitals reviewed.    UC Treatments / Results  Labs (all labs ordered are listed, but only abnormal results are displayed) Labs Reviewed  POCT RAPID STREP A - Abnormal; Notable for the following:       Result Value   Streptococcus, Group A Screen (Direct) POSITIVE (*)    All other components within normal limits    Strep pos. EKG  EKG Interpretation None       Radiology No results found.  Procedures Procedures (including critical care time)  Medications Ordered in UC Medications - No data to display   Initial Impression / Assessment and Plan / UC Course  I have reviewed the triage vital signs and the nursing notes.  Pertinent labs & imaging results that were available during my care of the patient were reviewed by me and considered in my medical decision making (see chart for details).       Final Clinical Impressions(s) / UC Diagnoses   Final diagnoses:  Strep pharyngitis  Vaginitis due to Candida    New Prescriptions Discharge Medication List as of 06/11/2016  1:37 PM    START taking these medications   Details  cefdinir (OMNICEF) 300 MG capsule Take 1 capsule (300 mg total) by mouth 2 (two) times daily., Starting Tue 06/11/2016, Normal    fluconazole (DIFLUCAN) 150 MG tablet Take 1 tablet (150 mg total) by mouth once. May repeat in 1 wk., Starting Tue 06/11/2016, Normal    terconazole (TERAZOL 3) 80 MG vaginal suppository Place 1 suppository (80 mg total) vaginally at bedtime., Starting Tue 06/11/2016, Normal         Linna HoffJames D Kindl, MD 06/19/16 2118

## 2016-06-11 NOTE — ED Triage Notes (Signed)
The patient presented to the [facility] with a complaint of a sore throat, abdominal cramping and symptoms of a yeast infection secondary to antibiotic use.

## 2016-07-23 ENCOUNTER — Encounter (HOSPITAL_COMMUNITY): Payer: Self-pay | Admitting: Family Medicine

## 2016-07-23 ENCOUNTER — Ambulatory Visit (HOSPITAL_COMMUNITY)
Admission: EM | Admit: 2016-07-23 | Discharge: 2016-07-23 | Disposition: A | Discharge status: Home / Self Care | Payer: Medicaid Other | Attending: Family Medicine | Admitting: Family Medicine

## 2016-07-23 DIAGNOSIS — G44209 Tension-type headache, unspecified, not intractable: Secondary | ICD-10-CM

## 2016-07-23 DIAGNOSIS — J00 Acute nasopharyngitis [common cold]: Secondary | ICD-10-CM

## 2016-07-23 DIAGNOSIS — R109 Unspecified abdominal pain: Secondary | ICD-10-CM

## 2016-07-23 MED ORDER — NAPROXEN 500 MG PO TABS: 500.0000 mg | ORAL_TABLET | Freq: Two times a day (BID) | ORAL | 0 refills | Status: DC

## 2016-07-23 MED ORDER — IPRATROPIUM BROMIDE 0.06 % NA SOLN: 2.0000 | Freq: Four times a day (QID) | NASAL | 0 refills | Status: DC

## 2016-07-23 NOTE — ED Provider Notes (Signed)
CSN: [id number]     Arrival date & time 07/23/16  1357 History   None    Chief Complaint  Patient presents with  . Back Pain   (Consider location/radiation/quality/duration/timing/severity/associated sxs/prior Treatment) Patient c/o right flank discomfort and states she felt weak at work but didn't pass out but felt like she did when she had pneumonia she states.  She c/o nasal congestion.  She c/o headache.  She c/o right flank pain and pain when ever she takes a deep breath.   The history is provided by the patient.  Back Pain  Location:  Unable to specify Quality:  Aching Radiates to:  Does not radiate Pain severity:  Moderate Onset quality:  Sudden Duration:  3 days Timing:  Constant Progression:  Unchanged Relieved by:  Nothing Worsened by:  Nothing Ineffective treatments:  None tried   Past Medical History:  Diagnosis Date  . Anemia   . Depression    refuses to take meds  . Gonorrhea   . Heart murmur   . Pneumonia   . Urinary tract infection    Past Surgical History:  Procedure Laterality Date  . CESAREAN SECTION     X 5   Family History  Problem Relation Age of Onset  . Hypertension Other   . Other Neg Hx    Social History  Substance Use Topics  . Smoking status: Former Smoker    Quit date: 12/30/2011  . Smokeless tobacco: Never Used  . Alcohol use Yes     Comment: occasional   OB History    Gravida Para Term Preterm AB Living   7 6 5 1   6    SAB TAB Ectopic Multiple Live Births           6     Review of Systems  Constitutional: Negative.   HENT: Negative.   Eyes: Negative.   Respiratory: Negative.   Cardiovascular: Negative.   Gastrointestinal: Negative.   Endocrine: Negative.   Genitourinary: Negative.   Musculoskeletal: Positive for back pain.  Allergic/Immunologic: Negative.   Neurological: Negative.   Hematological: Negative.   Psychiatric/Behavioral: Negative.     Allergies  Patient has no known allergies.  Home Medications    Prior to Admission medications   Not on File   Meds Ordered and Administered this Visit  Medications - No data to display  BP 133/92   Pulse 88   Temp 98.8 F (37.1 C) (Oral)   Resp 18   LMP 07/15/2016   SpO2 99%  No data found.   Physical Exam  Constitutional: She appears well-developed and well-nourished.  HENT:  Head: Normocephalic and atraumatic.  Right Ear: External ear normal.  Left Ear: External ear normal.  Mouth/Throat: Oropharynx is clear and moist.  Eyes: Conjunctivae and EOM are normal. Pupils are equal, round, and reactive to light.  Neck: Normal range of motion. Neck supple.  Cardiovascular: Normal rate, regular rhythm and normal heart sounds.   Pulmonary/Chest: Effort normal and breath sounds normal.  Abdominal: Soft. Bowel sounds are normal.  Musculoskeletal: She exhibits tenderness.  TTP right lateral lower rib cage.  No crepitus.   Nursing note and vitals reviewed.   Urgent Care Course     Procedures (including critical care time)  Labs Review Labs Reviewed - No data to display  Imaging Review No results found.   Visual Acuity Review  Right Eye Distance:   Left Eye Distance:   Bilateral Distance:    Right Eye Near:  Left Eye Near:    Bilateral Near:         MDM   1. Right flank pain   2. Acute nasopharyngitis   3. Tension-type headache, not intractable, unspecified chronicity pattern    Naprosyn 500mg  one po bid x 10 days #20 Atrovent Nasal Spray  Push po fluids, rest, tylenol and motrin otc prn as directed for fever, arthralgias, and myalgias.  Follow up prn if sx's continue or persist.    Deatra Canter, FNP 07/23/16 1526    Anselm Pancoast [place], [place] 07/23/16 [phone]

## 2016-07-23 NOTE — ED Triage Notes (Signed)
Pt here for right upper back pain moving into right flank area. sts she has been congested and thinks she may have PNA. sts that she was at work and felt like she was going  To pass out.

## 2017-03-24 ENCOUNTER — Encounter (HOSPITAL_COMMUNITY): Payer: Self-pay | Admitting: Emergency Medicine

## 2017-03-24 ENCOUNTER — Ambulatory Visit (HOSPITAL_COMMUNITY)
Admission: EM | Admit: 2017-03-24 | Discharge: 2017-03-24 | Disposition: A | Discharge status: Home / Self Care | Payer: Self-pay | Attending: Emergency Medicine | Admitting: Emergency Medicine

## 2017-03-24 DIAGNOSIS — R110 Nausea: Secondary | ICD-10-CM

## 2017-03-24 DIAGNOSIS — R51 Headache: Secondary | ICD-10-CM | POA: Insufficient documentation

## 2017-03-24 DIAGNOSIS — Z87440 Personal history of urinary (tract) infections: Secondary | ICD-10-CM | POA: Insufficient documentation

## 2017-03-24 DIAGNOSIS — Z8619 Personal history of other infectious and parasitic diseases: Secondary | ICD-10-CM | POA: Insufficient documentation

## 2017-03-24 DIAGNOSIS — R300 Dysuria: Secondary | ICD-10-CM | POA: Insufficient documentation

## 2017-03-24 DIAGNOSIS — N898 Other specified noninflammatory disorders of vagina: Secondary | ICD-10-CM | POA: Insufficient documentation

## 2017-03-24 DIAGNOSIS — Z3202 Encounter for pregnancy test, result negative: Secondary | ICD-10-CM

## 2017-03-24 DIAGNOSIS — R519 Headache, unspecified: Secondary | ICD-10-CM

## 2017-03-24 DIAGNOSIS — Z87891 Personal history of nicotine dependence: Secondary | ICD-10-CM | POA: Insufficient documentation

## 2017-03-24 LAB — POCT PREGNANCY, URINE: Preg Test, Ur: NEGATIVE

## 2017-03-24 MED ORDER — METRONIDAZOLE 500 MG PO TABS: 500.0000 mg | ORAL_TABLET | Freq: Two times a day (BID) | ORAL | 0 refills | Status: DC

## 2017-03-24 MED ORDER — FLUCONAZOLE 150 MG PO TABS: ORAL_TABLET | ORAL | 0 refills | Status: DC

## 2017-03-24 NOTE — ED Provider Notes (Signed)
[facility]    CSN: [id number] Arrival date & time: 03/24/17  1833     History   Chief Complaint Chief Complaint  Patient presents with  . Vaginal Discharge  . Generalized Body Aches    HPI Heather Lynch is a 31 y.o. female.   31 year old morbidly obese female with several complaints. She is here with her 5 children. She is complaining of a vaginal discharge which is described is cloudy. Also complaining of your genital irritation with burning associated with urination. In the past couple days she has had a headache, upset stomach which is better. No vomiting. She is having mucus in her throat and headache.      Past Medical History:  Diagnosis Date  . Anemia   . Depression    refuses to take meds  . Gonorrhea   . Heart murmur   . Pneumonia   . Urinary tract infection     There are no active problems to display for this patient.   Past Surgical History:  Procedure Laterality Date  . CESAREAN SECTION     X 5    OB History    Gravida Para Term Preterm AB Living   7 6 5 1   6    SAB TAB Ectopic Multiple Live Births           6       Home Medications    Prior to Admission medications   Medication Sig Start Date End Date Taking? Authorizing Provider  fluconazole (DIFLUCAN) 150 MG tablet 1 tab po x 1. May repeat in 72 hours if no improvement 03/24/17   Hayden RasmussenMabe, Darryel Diodato, NP  ipratropium (ATROVENT) 0.06 % nasal spray Place 2 sprays into both nostrils 4 (four) times daily. 07/23/16   Deatra Canterxford, William J, FNP  metroNIDAZOLE (FLAGYL) 500 MG tablet Take 1 tablet (500 mg total) 2 (two) times daily by mouth. X 7 days 03/24/17   Hayden RasmussenMabe, Kregg Cihlar, NP  naproxen (NAPROSYN) 500 MG tablet Take 1 tablet (500 mg total) by mouth 2 (two) times daily with a meal. 07/23/16   [place], Anselm PancoastWilliam J, FNP    Family History Family History  Problem Relation Age of Onset  . Hypertension Other   . Other Neg Hx     Social History Social History   Tobacco Use  . Smoking status:  Former Smoker    Last attempt to quit: 12/30/2011    Years since quitting: 5.2  . Smokeless tobacco: Never Used  Substance Use Topics  . Alcohol use: Yes    Comment: occasional  . Drug use: Yes    Types: Marijuana     Allergies   Patient has no known allergies.   Review of Systems Review of Systems  Constitutional: Negative.   HENT: Negative.   Eyes: Negative.   Respiratory: Negative.   Gastrointestinal: Positive for nausea. Negative for vomiting.       Improved, no current nausea  Genitourinary: Positive for dysuria and vaginal discharge. Negative for frequency.  Musculoskeletal: Negative.   Neurological: Positive for headaches.  All other systems reviewed and are negative.    Physical Exam Triage Vital Signs ED Triage Vitals [03/24/17 1904]  Enc Vitals Group     BP (!) 144/67     Pulse Rate 86     Resp 18     Temp 99.1 F (37.3 C)     Temp Source Oral     SpO2 100 %     Weight  Height      Head Circumference      Peak Flow      Pain Score 9     Pain Loc      Pain Edu?      Excl. in GC?    No data found.  Updated Vital Signs BP (!) 144/67 (BP Location: Right Arm)   Pulse 86   Temp 99.1 F (37.3 C) (Oral)   Resp 18   SpO2 100%   Visual Acuity Right Eye Distance:   Left Eye Distance:   Bilateral Distance:    Right Eye Near:   Left Eye Near:    Bilateral Near:     Physical Exam  Constitutional: She is oriented to person, place, and time. She appears well-developed and well-nourished. No distress.  Eyes: EOM are normal.  Neck: Normal range of motion. Neck supple.  Cardiovascular: Normal rate, regular rhythm, normal heart sounds and intact distal pulses.  Pulmonary/Chest: Effort normal and breath sounds normal. No respiratory distress.  Abdominal: She exhibits no distension and no mass. There is no rebound and no guarding.  Mild tenderness in the left mid abdomen.  Musculoskeletal: She exhibits no edema.  Neurological: She is alert and  oriented to person, place, and time. She exhibits normal muscle tone.  Skin: Skin is warm and dry.  Psychiatric: She has a normal mood and affect.  Nursing note and vitals reviewed.    UC Treatments / Results  Labs (all labs ordered are listed, but only abnormal results are displayed) Labs Reviewed  POCT PREGNANCY, URINE  CERVICOVAGINAL ANCILLARY ONLY    EKG  EKG Interpretation None       Radiology No results found.  Procedures Procedures (including critical care time)  Medications Ordered in UC Medications - No data to display   Initial Impression / Assessment and Plan / UC Course  I have reviewed the triage vital signs and the nursing notes.  Pertinent labs & imaging results that were available during my care of the patient were reviewed by me and considered in my medical decision making (see chart for details).    The urinalysis is clear. You are being treated for possible bacterial vaginosis and yeast infection. The test from the swab should be completed in 1-2 days. For any infection identified by this test that needs to be treated he will be called and likely can be treated over the telephone.     Final Clinical Impressions(s) / UC Diagnoses   Final diagnoses:  Vaginal discharge  Dysuria  Acute nonintractable headache, unspecified headache type    ED Discharge Orders        Ordered    metroNIDAZOLE (FLAGYL) 500 MG tablet  2 times daily     03/24/17 2111    fluconazole (DIFLUCAN) 150 MG tablet     03/24/17 2111       Controlled Substance Prescriptions [place] Controlled Substance Registry consulted? Not Applicable   Hayden Rasmussen, NP 03/24/17 2115

## 2017-03-24 NOTE — Discharge Instructions (Signed)
The urinalysis is clear. You are being treated for possible bacterial vaginosis and yeast infection. The test from the swab should be completed in 1-2 days. For any infection identified by this test that needs to be treated he will be called and likely can be treated over the telephone.

## 2017-03-24 NOTE — ED Triage Notes (Signed)
Pt here with vaginal discharge and some body aches and chills today

## 2017-03-25 LAB — POCT URINALYSIS DIP (DEVICE)
Bilirubin Urine: NEGATIVE
GLUCOSE, UA: NEGATIVE mg/dL
Ketones, ur: NEGATIVE mg/dL
LEUKOCYTES UA: NEGATIVE
Nitrite: NEGATIVE
PH: 8.5 — AB (ref 5.0–8.0)
Protein, ur: NEGATIVE mg/dL
Specific Gravity, Urine: 1.015 (ref 1.005–1.030)
Urobilinogen, UA: 0.2 mg/dL (ref 0.0–1.0)

## 2017-03-25 LAB — CERVICOVAGINAL ANCILLARY ONLY
Bacterial vaginitis: NEGATIVE
Candida vaginitis: POSITIVE — AB
Chlamydia: NEGATIVE
NEISSERIA GONORRHEA: NEGATIVE
TRICH (WINDOWPATH): NEGATIVE

## 2017-08-27 ENCOUNTER — Ambulatory Visit (HOSPITAL_COMMUNITY)
Admission: EM | Admit: 2017-08-27 | Discharge: 2017-08-27 | Disposition: A | Discharge status: Home / Self Care | Payer: Self-pay | Attending: Family Medicine | Admitting: Family Medicine

## 2017-08-27 ENCOUNTER — Encounter (HOSPITAL_COMMUNITY): Payer: Self-pay | Admitting: Emergency Medicine

## 2017-08-27 DIAGNOSIS — A5901 Trichomonal vulvovaginitis: Secondary | ICD-10-CM | POA: Insufficient documentation

## 2017-08-27 DIAGNOSIS — N760 Acute vaginitis: Secondary | ICD-10-CM | POA: Insufficient documentation

## 2017-08-27 DIAGNOSIS — N898 Other specified noninflammatory disorders of vagina: Secondary | ICD-10-CM

## 2017-08-27 DIAGNOSIS — R300 Dysuria: Secondary | ICD-10-CM

## 2017-08-27 DIAGNOSIS — B9689 Other specified bacterial agents as the cause of diseases classified elsewhere: Secondary | ICD-10-CM | POA: Insufficient documentation

## 2017-08-27 LAB — POCT URINALYSIS DIP (DEVICE)
Bilirubin Urine: NEGATIVE
Bilirubin Urine: NEGATIVE
GLUCOSE, UA: NEGATIVE mg/dL
Glucose, UA: NEGATIVE mg/dL
Hgb urine dipstick: NEGATIVE
Ketones, ur: NEGATIVE mg/dL
Leukocytes, UA: NEGATIVE
NITRITE: NEGATIVE
Nitrite: NEGATIVE
PH: 7.5 (ref 5.0–8.0)
PROTEIN: NEGATIVE mg/dL
Protein, ur: 30 mg/dL — AB
SPECIFIC GRAVITY, URINE: 1.01 (ref 1.005–1.030)
Specific Gravity, Urine: 1.02 (ref 1.005–1.030)
UROBILINOGEN UA: 1 mg/dL (ref 0.0–1.0)
UROBILINOGEN UA: 0.2 mg/dL (ref 0.0–1.0)
pH: 7 (ref 5.0–8.0)

## 2017-08-27 LAB — POCT PREGNANCY, URINE: PREG TEST UR: NEGATIVE

## 2017-08-27 MED ORDER — FLUCONAZOLE 150 MG PO TABS: ORAL_TABLET | ORAL | 0 refills | Status: DC

## 2017-08-27 MED ORDER — METRONIDAZOLE 500 MG PO TABS: 500.0000 mg | ORAL_TABLET | Freq: Two times a day (BID) | ORAL | 0 refills | Status: AC

## 2017-08-27 NOTE — ED Triage Notes (Signed)
Pt also requesting STD check

## 2017-08-27 NOTE — ED Triage Notes (Signed)
Pt c/o burning, vaginal discharge, burning with urination x3 weeks. Also states very mild pelvic pain.

## 2017-08-27 NOTE — Discharge Instructions (Signed)
Your urine did not show signs of infection today, we will send for culture. We will go ahead and treat you for yeast and bacterial vaginosis with diflucan and metronidazole. Do not drink while taking metronidazole. Please refrain from sexual intercourse for 7 days while medicines eliminating infection.   We are testing you for Gonorrhea, Chlamydia, Trichomonas, Yeast and Bacterial Vaginosis. We will call you if anything is positive and let you know if you require any further treatment. Please inform partners of any positive results.   Please return if symptoms not improving with treatment, development of fever, nausea, vomiting, abdominal pain.

## 2017-08-28 ENCOUNTER — Telehealth (HOSPITAL_COMMUNITY): Payer: Self-pay

## 2017-08-28 LAB — CERVICOVAGINAL ANCILLARY ONLY
BACTERIAL VAGINITIS: POSITIVE — AB
CANDIDA VAGINITIS: NEGATIVE
Chlamydia: NEGATIVE
Neisseria Gonorrhea: NEGATIVE
Trichomonas: POSITIVE — AB

## 2017-08-28 NOTE — ED Provider Notes (Signed)
[facility]    CSN: [id number] Arrival date & time: 08/27/17  1931     History   Chief Complaint Chief Complaint  Patient presents with  . Dysuria  . Vaginal Discharge    HPI Heather Lynch is a 32 y.o. female presenting with dysuria and vaginal discharge. States that for the past 3 weeks she has had dysuria near the urethra as well as increased frequency and incomplete voiding. Since she has also notice an increase in discharge associated with a fishy smell. She denies vaginal itching/irritation. Notes she has a history of BV infections. Denies fever, nausea, vomiting, abdominal pain. Does have slight pelvic discomfort. Patient also notes she had a partner she had unprotected sex with and found out he had another partner.    HPI  Past Medical History:  Diagnosis Date  . Anemia   . Depression    refuses to take meds  . Gonorrhea   . Heart murmur   . Pneumonia   . Urinary tract infection     There are no active problems to display for this patient.   Past Surgical History:  Procedure Laterality Date  . CESAREAN SECTION     X 5    OB History    Gravida  7   Para  6   Term  5   Preterm  1   AB      Living  6     SAB      TAB      Ectopic      Multiple      Live Births  6            Home Medications    Prior to Admission medications   Medication Sig Start Date End Date Taking? Authorizing Provider  fluconazole (DIFLUCAN) 150 MG tablet 1 tab po x 1. May repeat in 72 hours if no improvement 08/27/17   Reilley Valentine C, PA-C  ipratropium (ATROVENT) 0.06 % nasal spray Place 2 sprays into both nostrils 4 (four) times daily. 07/23/16   Deatra Canter, FNP  metroNIDAZOLE (FLAGYL) 500 MG tablet Take 1 tablet (500 mg total) by mouth 2 (two) times daily for 7 days. 08/27/17 09/03/17  Jamiracle Avants C, PA-C  naproxen (NAPROSYN) 500 MG tablet Take 1 tablet (500 mg total) by mouth 2 (two) times daily with a meal. Patient not taking:  Reported on 08/27/2017 07/23/16   Deatra Canter, FNP    Family History Family History  Problem Relation Age of Onset  . Hypertension Other   . Other Neg Hx     Social History Social History   Tobacco Use  . Smoking status: Former Smoker    Last attempt to quit: 12/30/2011    Years since quitting: 5.6  . Smokeless tobacco: Never Used  Substance Use Topics  . Alcohol use: Yes    Comment: occasional  . Drug use: Yes    Types: Marijuana     Allergies   Patient has no known allergies.   Review of Systems Review of Systems  Constitutional: Negative for fever.  Respiratory: Negative for shortness of breath.   Cardiovascular: Negative for chest pain.  Gastrointestinal: Negative for abdominal pain, diarrhea, nausea and vomiting.  Genitourinary: Positive for decreased urine volume, dysuria, frequency and vaginal discharge. Negative for flank pain, genital sores, hematuria, menstrual problem, vaginal bleeding and vaginal pain.  Musculoskeletal: Negative for back pain.  Skin: Negative for rash.  Neurological: Negative for dizziness, light-headedness and  headaches.     Physical Exam Triage Vital Signs ED Triage Vitals [08/27/17 1943]  Enc Vitals Group     BP (!) 144/83     Pulse Rate 70     Resp 18     Temp 98.3 F (36.8 C)     Temp src      SpO2 100 %     Weight      Height      Head Circumference      Peak Flow      Pain Score 6     Pain Loc      Pain Edu?      Excl. in GC?    No data found.  Updated Vital Signs BP (!) 144/83   Pulse 70   Temp 98.3 F (36.8 C)   Resp 18   LMP 08/20/2017   SpO2 100%   Visual Acuity Right Eye Distance:   Left Eye Distance:   Bilateral Distance:    Right Eye Near:   Left Eye Near:    Bilateral Near:     Physical Exam  Constitutional: She appears well-developed and well-nourished. No distress.  HENT:  Head: Normocephalic and atraumatic.  Eyes: Conjunctivae are normal.  Neck: Neck supple.  Cardiovascular:  Normal rate and regular rhythm.  No murmur heard. Pulmonary/Chest: Effort normal and breath sounds normal. No respiratory distress.  Abdominal: Soft. There is no tenderness.  nontender to light and deep palpation.  Genitourinary:  Genitourinary Comments: Pelvic exam deferred  Musculoskeletal: She exhibits no edema.  Neurological: She is alert.  Skin: Skin is warm and dry.  Psychiatric: She has a normal mood and affect.  Nursing note and vitals reviewed.    UC Treatments / Results  Labs (all labs ordered are listed, but only abnormal results are displayed) Labs Reviewed  POCT URINALYSIS DIP (DEVICE) - Abnormal; Notable for the following components:      Result Value   Ketones, ur TRACE (*)    Hgb urine dipstick TRACE (*)    Protein, ur 30 (*)    Leukocytes, UA TRACE (*)    All other components within normal limits  URINE CULTURE  POCT PREGNANCY, URINE  POCT URINALYSIS DIP (DEVICE)  CERVICOVAGINAL ANCILLARY ONLY    EKG None Radiology No results found.  Procedures Procedures (including critical care time)  Medications Ordered in UC Medications - No data to display   Initial Impression / Assessment and Plan / UC Course  I have reviewed the triage vital signs and the nursing notes.  Pertinent labs & imaging results that were available during my care of the patient were reviewed by me and considered in my medical decision making (see chart for details).     Dirty UA with trace leuks, clean UA with negative leuks. Urinary symptoms may be related to vaginal irritation. Will empirically treat for yeast and BV. Vaginal Swab sent for STD/yeast/BV. Discussed strict return precautions. Patient verbalized understanding and is agreeable with plan.   Final Clinical Impressions(s) / UC Diagnoses   Final diagnoses:  Vaginal discharge  Dysuria    ED Discharge Orders        Ordered    fluconazole (DIFLUCAN) 150 MG tablet     08/27/17 2037    metroNIDAZOLE (FLAGYL) 500 MG  tablet  2 times daily     08/27/17 2037       Controlled Substance Prescriptions [place] Controlled Substance Registry consulted? Not Applicable   Lew DawesWieters, Torrian Canion C, [place]PA-C 08/28/17 [phone]

## 2017-08-28 NOTE — Telephone Encounter (Signed)
Bacterial Vaginosis test is positive.  Prescription for metronidazole was given at the urgent care visit. Pt contacted regarding results. Answered all questions. Verbalized understanding.  Trichomonas is positive. Rx metronidazole was given at the urgent care visit. Pt contacted and made aware, educated to please refrain from sexual intercourse for 7 days to give the medicine time to work. Sexual partners need to be notified and tested/treated. Condoms may reduce risk of reinfection. Recheck for further evaluation if symptoms are not improving. Answered all questions.  

## 2017-08-29 LAB — URINE CULTURE

## 2017-09-01 ENCOUNTER — Telehealth (HOSPITAL_COMMUNITY): Payer: Self-pay

## 2017-09-01 NOTE — Telephone Encounter (Signed)
Urine culture does not clearly demonstrate a UTI.  No answer at this time. Voicemail left.

## 2018-01-01 ENCOUNTER — Other Ambulatory Visit: Payer: Self-pay

## 2018-01-01 ENCOUNTER — Encounter (HOSPITAL_COMMUNITY): Payer: Self-pay | Admitting: Emergency Medicine

## 2018-01-01 ENCOUNTER — Ambulatory Visit (HOSPITAL_COMMUNITY)
Admission: EM | Admit: 2018-01-01 | Discharge: 2018-01-01 | Disposition: A | Discharge status: Home / Self Care | Payer: Self-pay | Attending: Family Medicine | Admitting: Family Medicine

## 2018-01-01 DIAGNOSIS — N390 Urinary tract infection, site not specified: Secondary | ICD-10-CM | POA: Insufficient documentation

## 2018-01-01 DIAGNOSIS — N760 Acute vaginitis: Secondary | ICD-10-CM | POA: Insufficient documentation

## 2018-01-01 LAB — POCT URINALYSIS DIP (DEVICE)
BILIRUBIN URINE: NEGATIVE
Glucose, UA: NEGATIVE mg/dL
Ketones, ur: NEGATIVE mg/dL
Leukocytes, UA: NEGATIVE
NITRITE: POSITIVE — AB
PH: 7 (ref 5.0–8.0)
PROTEIN: NEGATIVE mg/dL
Specific Gravity, Urine: 1.02 (ref 1.005–1.030)
UROBILINOGEN UA: 0.2 mg/dL (ref 0.0–1.0)

## 2018-01-01 LAB — POCT PREGNANCY, URINE: PREG TEST UR: NEGATIVE

## 2018-01-01 MED ORDER — NITROFURANTOIN MONOHYD MACRO 100 MG PO CAPS: 100.0000 mg | ORAL_CAPSULE | Freq: Two times a day (BID) | ORAL | 0 refills | Status: AC

## 2018-01-01 MED ORDER — METRONIDAZOLE 500 MG PO TABS: 500.0000 mg | ORAL_TABLET | Freq: Two times a day (BID) | ORAL | 0 refills | Status: AC

## 2018-01-01 NOTE — ED Triage Notes (Signed)
Vaginal discharge and flank pain.. 2 months

## 2018-01-01 NOTE — Discharge Instructions (Addendum)
Your urine is concerning for UTI so we will start antibiotics for this, a culture is also pending.  Drink plenty of water to empty bladder regularly. Avoid alcohol and caffeine as these may irritate the bladder.   We will start medications for bacterial vaginosis as the vaginal testing is pending.  Complete course of antibiotics.   Do not drink alcohol while taking flagyl.  Please withhold from intercourse for the next week. Establish with a primary care provider, follow up with any persistent symptoms.

## 2018-01-01 NOTE — ED Provider Notes (Signed)
[facility]    CSN: [id number] Arrival date & time: 01/01/18  1512     History   Chief Complaint Chief Complaint  Patient presents with  . Flank Pain  . Vaginal Discharge    HPI Larina BrasMonique Falco is a 32 y.o. female.   Gabriel RungMonique presents with complaints of vaginal odor with white discharge as well as cloudy urine. Symptoms have been intermittent for the past 7 weeks. Last night had some right abdominal pain, only if area is pressed, no pain at rest or with movement. No nausea, vomiting, diarrhea. No constipation. Eating and drinking normally. Occasional vaginal itching. Sexually active with 1 partner, denies concern for STD's. Does not use condoms. Is not on birth control. States last period was two weeks ago but came sooner than expected, feels periods have been irregular. Occasionally has had urinary urgency and burning. Not regularly however. No fevers. Has had yeast, bv, gonorrhea, trichomonas in the past.    ROS per HPI.      Past Medical History:  Diagnosis Date  . Anemia   . Depression    refuses to take meds  . Gonorrhea   . Heart murmur   . Pneumonia   . Urinary tract infection     There are no active problems to display for this patient.   Past Surgical History:  Procedure Laterality Date  . CESAREAN SECTION     X 5    OB History    Gravida  7   Para  6   Term  5   Preterm  1   AB      Living  6     SAB      TAB      Ectopic      Multiple      Live Births  6            Home Medications    Prior to Admission medications   Medication Sig Start Date End Date Taking? Authorizing Provider  metroNIDAZOLE (FLAGYL) 500 MG tablet Take 1 tablet (500 mg total) by mouth 2 (two) times daily for 7 days. 01/01/18 01/08/18  Georgetta HaberBurky, Davius Goudeau B, NP  nitrofurantoin, macrocrystal-monohydrate, (MACROBID) 100 MG capsule Take 1 capsule (100 mg total) by mouth 2 (two) times daily for 5 days. 01/01/18 01/06/18  Georgetta HaberBurky, Kamarius Buckbee B, NP    Family  History Family History  Problem Relation Age of Onset  . Hypertension Other   . Other Neg Hx     Social History Social History   Tobacco Use  . Smoking status: Former Smoker    Last attempt to quit: 12/30/2011    Years since quitting: 6.0  . Smokeless tobacco: Never Used  Substance Use Topics  . Alcohol use: Yes    Comment: occasional  . Drug use: Yes    Types: Marijuana     Allergies   Patient has no known allergies.   Review of Systems Review of Systems   Physical Exam Triage Vital Signs ED Triage Vitals  Enc Vitals Group     BP 01/01/18 1613 136/88     Pulse Rate 01/01/18 1613 82     Resp 01/01/18 1613 18     Temp 01/01/18 1613 97.7 F (36.5 C)     Temp src --      SpO2 01/01/18 1613 100 %     Weight --      Height --      Head Circumference --  Peak Flow --      Pain Score 01/01/18 1617 5     Pain Loc --      Pain Edu? --      Excl. in GC? --    No data found.  Updated Vital Signs BP 136/88   Pulse 82   Temp 97.7 F (36.5 C)   Resp 18   LMP 12/23/2017   SpO2 100%   Visual Acuity Right Eye Distance:   Left Eye Distance:   Bilateral Distance:    Right Eye Near:   Left Eye Near:    Bilateral Near:     Physical Exam  Constitutional: She is oriented to person, place, and time. She appears well-developed and well-nourished. No distress.  Cardiovascular: Normal rate, regular rhythm and normal heart sounds.  Pulmonary/Chest: Effort normal and breath sounds normal.  Abdominal: Soft. Bowel sounds are normal. There is no tenderness. There is no rigidity, no rebound, no guarding and no CVA tenderness.  No reproducible pain on palpation on exam; indicates occasional pain to right mid abdomen- such as when her daughter jumped on her abdomen.   Genitourinary:  Genitourinary Comments: Denies sores, lesions, vaginal bleeding; no pelvic pain; gu exam deferred at this time, vaginal self swab collected.   Neurological: She is alert and oriented to  person, place, and time.  Skin: Skin is warm and dry.     UC Treatments / Results  Labs (all labs ordered are listed, but only abnormal results are displayed) Labs Reviewed  POCT URINALYSIS DIP (DEVICE) - Abnormal; Notable for the following components:      Result Value   Hgb urine dipstick TRACE (*)    Nitrite POSITIVE (*)    All other components within normal limits  URINE CULTURE  CERVICOVAGINAL ANCILLARY ONLY    EKG None  Radiology No results found.  Procedures Procedures (including critical care time)  Medications Ordered in UC Medications - No data to display  Initial Impression / Assessment and Plan / UC Course  I have reviewed the triage vital signs and the nursing notes.  Pertinent labs & imaging results that were available during my care of the patient were reviewed by me and considered in my medical decision making (see chart for details).     Urine with nitrite and trace hgb. Course of macrobid provided. Concern also for BV with flagyl initiated. Vaginal cytology pending and urine culture pending. Will notify of any positive findings and if any changes to treatment are needed.  If symptoms worsen or do not improve in the next week to return to be seen or to follow up with PCP. Patient verbalized understanding and agreeable to plan.   Final Clinical Impressions(s) / UC Diagnoses   Final diagnoses:  Acute vaginitis  Lower urinary tract infectious disease     Discharge Instructions     Your urine is concerning for UTI so we will start antibiotics for this, a culture is also pending.  We will start medications for bacterial vaginosis as the vaginal testing is pending.  Complete course of antibiotics.   Do not drink alcohol while taking flagyl.  Please withhold from intercourse for the next week. Establish with a primary care provider, follow up with any persistent symptoms.     ED Prescriptions    Medication Sig Dispense Auth. Provider    nitrofurantoin, macrocrystal-monohydrate, (MACROBID) 100 MG capsule Take 1 capsule (100 mg total) by mouth 2 (two) times daily for 5 days. 10 capsule Linus Mako  B, NP   metroNIDAZOLE (FLAGYL) 500 MG tablet Take 1 tablet (500 mg total) by mouth 2 (two) times daily for 7 days. 14 tablet Georgetta HaberBurky, [place] Rosamond B, NP     Controlled Substance Prescriptions [place] Controlled Substance Registry consulted? Not Applicable   Georgetta HaberBurky, Elizbeth Posa B, NP 01/01/18 1657

## 2018-01-02 LAB — CERVICOVAGINAL ANCILLARY ONLY
Bacterial vaginitis: NEGATIVE
Candida vaginitis: POSITIVE — AB
Chlamydia: NEGATIVE
NEISSERIA GONORRHEA: NEGATIVE
TRICH (WINDOWPATH): POSITIVE — AB

## 2018-01-03 LAB — URINE CULTURE

## 2018-01-05 ENCOUNTER — Telehealth (HOSPITAL_COMMUNITY): Payer: Self-pay

## 2018-01-05 MED ORDER — FLUCONAZOLE 150 MG PO TABS: 150.0000 mg | ORAL_TABLET | Freq: Every day | ORAL | 0 refills | Status: AC

## 2018-01-05 NOTE — Telephone Encounter (Signed)
Urine culture positive for E.coli this was treated with Macrobid at ucc visit.  Trichomonas is positive. Rx metronidazole was given at the urgent care visit. Pt contacted and made aware, educated to please refrain from sexual intercourse for 7 days to give the medicine time to work. Sexual partners need to be notified and tested/treated. Condoms may reduce risk of reinfection. Recheck for further evaluation if symptoms are not improving. Answered all questions.  Pt contacted regarding test for candida (yeast) was positive.  Prescription for fluconazole 150mg  po now, repeat dose in 3d if needed, #2 no refills, sent to the pharmacy of record.  Recheck or followup with PCP for further evaluation if symptoms are not improving.  Answered all questions.

## 2018-05-20 NOTE — L&D Delivery Note (Signed)
Delivery Note At 3:05 AM a non-viable female was delivered, intact, via Vaginal, Spontaneous (Presentation: Vertex).  APGAR: 0, 0; weight 5.3 oz (150 g).  Several large clots passed after delivery of fetus. Clots removed and cord clamped and cut; patient declined to view or hold fetus who was then handed off to staff.  Patient's bleeding was a constant steady trickle and pitocin 10U given IM. Bleeding slowed slightly and placenta remained in place.  Dr. Marcie Bal into room to evaluate and patient given Cytotec PR with improvement of bleeding. Order placed for Type and Cross of 2 Units. Placenta remained in place and speculum exam performed with removal of several blood clots and bleeding resumed.  Dr. Debroah Loop discussed D&C with patient for removal of placenta in setting of vaginal bleeding.  Patient agreeable and consented.     Anesthesia:  None Episiotomy: None Lacerations: None Suture Repair: None Est. Blood Loss (Quant mL):  2170  Mom to OR.  Baby to [place].  Cherre Robins MSN, CNM 10/20/2018, 4:08 AM

## 2018-09-04 ENCOUNTER — Emergency Department (HOSPITAL_COMMUNITY): Payer: Medicaid Other

## 2018-09-04 ENCOUNTER — Other Ambulatory Visit: Payer: Self-pay

## 2018-09-04 ENCOUNTER — Emergency Department (HOSPITAL_COMMUNITY)
Admission: EM | Admit: 2018-09-04 | Discharge: 2018-09-04 | Disposition: A | Discharge status: Home / Self Care | Payer: Medicaid Other | Attending: Emergency Medicine | Admitting: Emergency Medicine

## 2018-09-04 DIAGNOSIS — R10817 Generalized abdominal tenderness: Secondary | ICD-10-CM | POA: Insufficient documentation

## 2018-09-04 DIAGNOSIS — O23591 Infection of other part of genital tract in pregnancy, first trimester: Secondary | ICD-10-CM | POA: Insufficient documentation

## 2018-09-04 DIAGNOSIS — Z87891 Personal history of nicotine dependence: Secondary | ICD-10-CM | POA: Insufficient documentation

## 2018-09-04 DIAGNOSIS — Z3A10 10 weeks gestation of pregnancy: Secondary | ICD-10-CM | POA: Insufficient documentation

## 2018-09-04 DIAGNOSIS — O468X1 Other antepartum hemorrhage, first trimester: Secondary | ICD-10-CM | POA: Diagnosis present

## 2018-09-04 DIAGNOSIS — O200 Threatened abortion: Secondary | ICD-10-CM | POA: Diagnosis not present

## 2018-09-04 DIAGNOSIS — O209 Hemorrhage in early pregnancy, unspecified: Secondary | ICD-10-CM

## 2018-09-04 DIAGNOSIS — B9689 Other specified bacterial agents as the cause of diseases classified elsewhere: Secondary | ICD-10-CM | POA: Diagnosis not present

## 2018-09-04 DIAGNOSIS — F1290 Cannabis use, unspecified, uncomplicated: Secondary | ICD-10-CM | POA: Insufficient documentation

## 2018-09-04 DIAGNOSIS — N760 Acute vaginitis: Secondary | ICD-10-CM

## 2018-09-04 LAB — URINALYSIS, ROUTINE W REFLEX MICROSCOPIC
Bilirubin Urine: NEGATIVE
Glucose, UA: NEGATIVE mg/dL
Hgb urine dipstick: NEGATIVE
Ketones, ur: NEGATIVE mg/dL
Leukocytes,Ua: NEGATIVE
Nitrite: NEGATIVE
Protein, ur: NEGATIVE mg/dL
Specific Gravity, Urine: 1.005 (ref 1.005–1.030)
pH: 7 (ref 5.0–8.0)

## 2018-09-04 LAB — CBC WITH DIFFERENTIAL/PLATELET
Abs Immature Granulocytes: 0.05 10*3/uL (ref 0.00–0.07)
Basophils Absolute: 0 10*3/uL (ref 0.0–0.1)
Basophils Relative: 0 %
Eosinophils Absolute: 0.2 10*3/uL (ref 0.0–0.5)
Eosinophils Relative: 2 %
HCT: 35.6 % — ABNORMAL LOW (ref 36.0–46.0)
Hemoglobin: 11 g/dL — ABNORMAL LOW (ref 12.0–15.0)
Immature Granulocytes: 1 %
Lymphocytes Relative: 29 %
Lymphs Abs: 2.7 10*3/uL (ref 0.7–4.0)
MCH: 28.9 pg (ref 26.0–34.0)
MCHC: 30.9 g/dL (ref 30.0–36.0)
MCV: 93.4 fL (ref 80.0–100.0)
Monocytes Absolute: 0.7 10*3/uL (ref 0.1–1.0)
Monocytes Relative: 7 %
Neutro Abs: 5.8 10*3/uL (ref 1.7–7.7)
Neutrophils Relative %: 61 %
Platelets: 378 10*3/uL (ref 150–400)
RBC: 3.81 MIL/uL — ABNORMAL LOW (ref 3.87–5.11)
RDW: 15.4 % (ref 11.5–15.5)
WBC: 9.4 10*3/uL (ref 4.0–10.5)
nRBC: 0 % (ref 0.0–0.2)

## 2018-09-04 LAB — HCG, QUANTITATIVE, PREGNANCY: hCG, Beta Chain, Quant, S: 68276 m[IU]/mL — ABNORMAL HIGH (ref <5)

## 2018-09-04 LAB — I-STAT BETA HCG BLOOD, ED (MC, WL, AP ONLY): I-stat hCG, quantitative: >2000 m[IU]/mL — ABNORMAL HIGH (ref <5)

## 2018-09-04 LAB — POTASSIUM: Potassium: 4 mmol/L (ref 3.5–5.1)

## 2018-09-04 LAB — BASIC METABOLIC PANEL
Anion gap: 10 (ref 5–15)
BUN: 6 mg/dL (ref 6–20)
CO2: 20 mmol/L — ABNORMAL LOW (ref 22–32)
Calcium: 8.7 mg/dL — ABNORMAL LOW (ref 8.9–10.3)
Chloride: 103 mmol/L (ref 98–111)
Creatinine, Ser: 0.62 mg/dL (ref 0.44–1.00)
GFR calc Af Amer: >60 mL/min (ref >60)
GFR calc non Af Amer: >60 mL/min (ref >60)
Glucose, Bld: 86 mg/dL (ref 70–99)
Potassium: 6.1 mmol/L — ABNORMAL HIGH (ref 3.5–5.1)
Sodium: 133 mmol/L — ABNORMAL LOW (ref 135–145)

## 2018-09-04 LAB — ABO/RH: ABO/RH(D): O POS

## 2018-09-04 LAB — WET PREP, GENITAL
Sperm: NONE SEEN
Trich, Wet Prep: NONE SEEN
Yeast Wet Prep HPF POC: NONE SEEN

## 2018-09-04 MED ORDER — SODIUM CHLORIDE 0.9 % IV BOLUS
1000.0000 mL | Freq: Once | INTRAVENOUS | Status: AC
  Administered 2018-09-04: 02:00:00 1000 mL via INTRAVENOUS

## 2018-09-04 MED ORDER — METRONIDAZOLE 500 MG PO TABS: 500.0000 mg | ORAL_TABLET | Freq: Two times a day (BID) | ORAL | 0 refills | Status: DC

## 2018-09-04 MED ORDER — SODIUM CHLORIDE 0.9 % IV BOLUS
1000.0000 mL | Freq: Once | INTRAVENOUS | Status: AC
  Administered 2018-09-04: 05:00:00 1000 mL via INTRAVENOUS

## 2018-09-04 MED ORDER — SODIUM CHLORIDE 0.9 % IV SOLN: INTRAVENOUS | Status: DC

## 2018-09-04 NOTE — ED Provider Notes (Signed)
[facility] EMERGENCY DEPARTMENT Provider Note   CSN: [id number] Arrival date & time: 09/04/18  0150    History   Chief Complaint No chief complaint on file.   HPI Heather Lynch is a 33 y.o. female.     Patient presents with vaginal bleeding and pregnancy.  States she believes she is about 11 or [redacted] weeks pregnant by dates.  Her last menstrual period was at the end of January.  She has not had an ultrasound with this pregnancy or seen her obstetrician.  She reports she had some spotting several weeks ago which resolved but today while washing the dishes she had a "gush of fluid" followed by some bright red bleeding and abdominal cramping.  She denies seeing any clots or tissues.  She denies any fever or vomiting.  She reports she is having mild cramping now.  Before the fluid came out she has some cramping of her left side which is since resolved.  No chest pain or shortness of breath.  No dizziness or lightheadedness.  She is G8, P7 with 6 previous C-sections.  No other abdominal surgeries.  Her obstetrician is at [facility].  She has not seen them for this pregnancy or had an ultrasound.  The history is provided by the patient.    Past Medical History:  Diagnosis Date  . Anemia   . Depression    refuses to take meds  . Gonorrhea   . Heart murmur   . Pneumonia   . Urinary tract infection     There are no active problems to display for this patient.   Past Surgical History:  Procedure Laterality Date  . CESAREAN SECTION     X 5     OB History    Gravida  7   Para  6   Term  5   Preterm  1   AB      Living  6     SAB      TAB      Ectopic      Multiple      Live Births  6            Home Medications    Prior to Admission medications   Not on File    Family History Family History  Problem Relation Age of Onset  . Hypertension Other   . Other Neg Hx     Social History Social History   Tobacco Use  . Smoking status:  Former Smoker    Last attempt to quit: 12/30/2011    Years since quitting: 6.6  . Smokeless tobacco: Never Used  Substance Use Topics  . Alcohol use: Yes    Comment: occasional  . Drug use: Yes    Types: Marijuana     Allergies   Patient has no known allergies.   Review of Systems Review of Systems  Constitutional: Negative for activity change, appetite change and fever.  HENT: Negative for congestion.   Eyes: Negative for visual disturbance.  Respiratory: Negative for cough, chest tightness and shortness of breath.   Cardiovascular: Negative for chest pain.  Gastrointestinal: Positive for abdominal pain.  Genitourinary: Positive for vaginal bleeding. Negative for dysuria and hematuria.  Musculoskeletal: Negative for arthralgias, back pain and myalgias.  Skin: Negative for rash.  Neurological: Negative for dizziness, weakness and headaches.   all other systems are negative except as noted in the HPI and PMH.     Physical Exam Updated Vital Signs  BP 127/84 (BP Location: Left Arm)   Pulse (!) 105   Temp 99.2 F (37.3 C) (Oral)   Resp 16   SpO2 100%   Physical Exam Vitals signs and nursing note reviewed.  Constitutional:      General: She is not in acute distress.    Appearance: She is well-developed. She is obese.  HENT:     Head: Normocephalic and atraumatic.     Mouth/Throat:     Pharynx: No oropharyngeal exudate.  Eyes:     Conjunctiva/sclera: Conjunctivae normal.     Pupils: Pupils are equal, round, and reactive to light.  Neck:     Musculoskeletal: Normal range of motion and neck supple.     Comments: No meningismus. Cardiovascular:     Rate and Rhythm: Regular rhythm. Tachycardia present.     Heart sounds: Normal heart sounds. No murmur.     Comments: Heart rate 105 Pulmonary:     Effort: Pulmonary effort is normal. No respiratory distress.     Breath sounds: Normal breath sounds.  Abdominal:     Palpations: Abdomen is soft.     Tenderness: There is  abdominal tenderness. There is no guarding or rebound.     Comments: Obese abdomen, mild suprapubic tenderness, no guarding or rebound  Genitourinary:    Comments: Chaperone present.  No external genitalia.  There is some slight pink discharge in vaginal vault.  No active bleeding.  Sterile bimanual exam performed and cervix is closed without significant tenderness. Mild suprapubic pain, no lateralizing adnexal pain Musculoskeletal: Normal range of motion.        General: No tenderness.     Comments: No CVA tenderness  Skin:    General: Skin is warm.     Capillary Refill: Capillary refill takes less than 2 seconds.  Neurological:     General: No focal deficit present.     Mental Status: She is alert and oriented to person, place, and time. Mental status is at baseline.     Cranial Nerves: No cranial nerve deficit.     Motor: No abnormal muscle tone.     Coordination: Coordination normal.     Comments:  5/5 strength throughout. CN 2-12 intact.Equal grip strength.   Psychiatric:        Behavior: Behavior normal.      ED Treatments / Results  Labs (all labs ordered are listed, but only abnormal results are displayed) Labs Reviewed  WET PREP, GENITAL - Abnormal; Notable for the following components:      Result Value   Clue Cells Wet Prep HPF POC PRESENT (*)    WBC, Wet Prep HPF POC MODERATE (*)    All other components within normal limits  CBC WITH DIFFERENTIAL/PLATELET - Abnormal; Notable for the following components:   RBC 3.81 (*)    Hemoglobin 11.0 (*)    HCT 35.6 (*)    All other components within normal limits  BASIC METABOLIC PANEL - Abnormal; Notable for the following components:   Sodium 133 (*)    Potassium 6.1 (*)    CO2 20 (*)    Calcium 8.7 (*)    All other components within normal limits  HCG, QUANTITATIVE, PREGNANCY - Abnormal; Notable for the following components:   hCG, Beta Chain, Quant, S 68,276 (*)    All other components within normal limits   URINALYSIS, ROUTINE W REFLEX MICROSCOPIC - Abnormal; Notable for the following components:   Color, Urine STRAW (*)    All other  components within normal limits  I-STAT BETA HCG BLOOD, ED ([facility], [facility], [facility] ONLY) - Abnormal; Notable for the following components:   I-stat hCG, quantitative >2,000.0 (*)    All other components within normal limits  URINE CULTURE  POTASSIUM  ABO/RH  GC/CHLAMYDIA PROBE AMP ([place]) NOT AT [facility]    EKG None  Radiology [place] Ob Less Than 14 Weeks With Ob Transvaginal  Result Date: 09/04/2018 CLINICAL DATA:  Vaginal bleeding before [redacted] weeks gestation. EXAM: OBSTETRIC <14 WK ULTRASOUND TECHNIQUE: Transabdominal ultrasound was performed for evaluation of the gestation as well as the maternal uterus and adnexal regions. COMPARISON:  None for this pregnancy. FINDINGS: Intrauterine gestational sac: Present Yolk sac:  Not visualized Embryo:  Present Cardiac Activity: Present Heart Rate: 173 bpm Fetal movement is noted. CRL: 4 mm   10 w 6 d                  US EDC: 03/27/2019 Subchorionic hemorrhage: A large hyperdense subchorionic hemorrhage is noted. This involves approximately half of the gestational sac. Maternal uterus/adnexae: Uterus and adnexa are otherwise unremarkable. IMPRESSION: 1. Prominent heterogeneous hyperdense subchorionic hemorrhage involving approximately 1/2 of the gestational sac. 2. Single intrauterine pregnancy with normal heart rate and fetal movement. 3. Estimated gestational age of [redacted] weeks 6 days. Electronically Signed   By: Marin Robertshristopher  Mattern M.D.   On: 09/04/2018 04:29    Procedures Procedures (including critical care time)  Medications Ordered in ED Medications  0.9 %  sodium chloride infusion (has no administration in time range)  sodium chloride 0.9 % bolus 1,000 mL (has no administration in time range)     Initial Impression / Assessment and Plan / ED Course  I have reviewed the triage vital signs and the nursing notes.  Pertinent  labs & imaging results that were available during my care of the patient were reviewed by me and considered in my medical decision making (see chart for details).       G8, P7 at [redacted] weeks gestation by dates presenting with abdominal cramping and vaginal bleeding.  She has not had any prenatal care or ultrasound with this pregnancy.  We will need to check labs, hCG, Rh type , Check ultrasound and pelvic exam.  hCG positive.  Hemoglobin is stable.  Rh is positive, not RhoGam candidate. Mild tachycardia persist with adequate blood pressure.  We will proceed with ultrasound to evaluate location and viability of pregnancy.  Ultrasound shows single IUP at 10 weeks 6 days gestation.  Fetal heart tones 173.  There is a subchorionic hemorrhage taking at half the gestational sac.  These results were discussed with obstetrics Dr. Macon LargeAnyanwu.  She states there is nothing to do about the subchorionic hemorrhage and this will need to be monitored by her primary OB.  Will treat as threatened miscarriage and give bleeding precautions and treat her bacterial vaginosis.  She is not a RhoGam candidate.  Her hemoglobin is stable.  Heart rate has improved to 86 after IV fluids.  Orthostatics were positive by heart rate but this has improved.  No further vaginal bleeding.  Results of ultrasound discussed with patient.  Discussed that subchorionic hemorrhage will need to be monitored by her OB.  She states she has an appointment on April 23.  Her abdominal cramping has resolved.  She is tolerating p.o. and ambulatory. Return precautions discussed including worsening pain, bleeding, dizziness or any other concerns.  BP (!) 117/59 (BP Location: Left Arm)   Pulse 86  Temp 99.8 F (37.7 C) (Oral)   Resp 20   SpO2 100%     Final Clinical Impressions(s) / ED Diagnoses   Final diagnoses:  Threatened miscarriage  BV (bacterial vaginosis)    ED Discharge Orders    None       Chelise Hanger, Jeannett Senior, MD  09/04/18 [phone]

## 2018-09-04 NOTE — ED Notes (Signed)
Patient transported to US 

## 2018-09-04 NOTE — ED Notes (Signed)
Patient verbalizes understanding of discharge instructions. Opportunity for questioning and answers were provided. Armband removed by staff, pt discharged from ED.  

## 2018-09-04 NOTE — Discharge Instructions (Addendum)
Your ultrasound shows a pregnancy in your uterus.  There is some bleeding called subchorionic hemorrhage that needs to be monitored by your [facility] doctor.  There is a chance that this could still progress to miscarriage.  Follow-up with your doctor as scheduled.  Return to the ED with worsening pain, bleeding, dizziness or any other concerns.

## 2018-09-04 NOTE — ED Triage Notes (Signed)
Pt states was having some vaginal bleeding at home. Last menstrual period was in january. Also stated had abdominal cramping.

## 2018-09-05 LAB — URINE CULTURE: Culture: NO GROWTH

## 2018-09-05 LAB — GC/CHLAMYDIA PROBE AMP (~~LOC~~) NOT AT ARMC
Chlamydia: NEGATIVE
Neisseria Gonorrhea: NEGATIVE

## 2018-09-23 ENCOUNTER — Other Ambulatory Visit: Payer: Self-pay

## 2018-09-23 ENCOUNTER — Encounter (HOSPITAL_COMMUNITY): Payer: Self-pay | Admitting: *Deleted

## 2018-09-23 ENCOUNTER — Inpatient Hospital Stay (HOSPITAL_COMMUNITY)
Admission: EM | Admit: 2018-09-23 | Discharge: 2018-09-23 | Disposition: A | Discharge status: Home / Self Care | Payer: Medicaid Other | Attending: Obstetrics & Gynecology | Admitting: Obstetrics & Gynecology

## 2018-09-23 DIAGNOSIS — O468X1 Other antepartum hemorrhage, first trimester: Secondary | ICD-10-CM

## 2018-09-23 DIAGNOSIS — O208 Other hemorrhage in early pregnancy: Secondary | ICD-10-CM | POA: Insufficient documentation

## 2018-09-23 DIAGNOSIS — O26892 Other specified pregnancy related conditions, second trimester: Secondary | ICD-10-CM | POA: Diagnosis not present

## 2018-09-23 DIAGNOSIS — Z6740 Type O blood, Rh positive: Secondary | ICD-10-CM

## 2018-09-23 DIAGNOSIS — Z3A13 13 weeks gestation of pregnancy: Secondary | ICD-10-CM | POA: Diagnosis not present

## 2018-09-23 DIAGNOSIS — N898 Other specified noninflammatory disorders of vagina: Secondary | ICD-10-CM | POA: Diagnosis not present

## 2018-09-23 DIAGNOSIS — O209 Hemorrhage in early pregnancy, unspecified: Secondary | ICD-10-CM

## 2018-09-23 DIAGNOSIS — Z362 Encounter for other antenatal screening follow-up: Secondary | ICD-10-CM

## 2018-09-23 DIAGNOSIS — O418X10 Other specified disorders of amniotic fluid and membranes, first trimester, not applicable or unspecified: Secondary | ICD-10-CM

## 2018-09-23 DIAGNOSIS — Z87891 Personal history of nicotine dependence: Secondary | ICD-10-CM | POA: Insufficient documentation

## 2018-09-23 DIAGNOSIS — O360110 Maternal care for anti-D [Rh] antibodies, first trimester, not applicable or unspecified: Secondary | ICD-10-CM

## 2018-09-23 NOTE — MAU Note (Signed)
Pt gets care in [facility]. Dx with subchorionic hemmorage  In April. Has had light bleeding an d spotting for a few weeks. Bleeding got a little heavier yesterday and then started passing clots today and having cramping on and off since last night.

## 2018-09-23 NOTE — Discharge Instructions (Signed)
Subchorionic Hematoma ° °A subchorionic hematoma is a gathering of blood between the outer wall of the embryo (chorion) and the inner wall of the womb (uterus). °This condition can cause vaginal bleeding. If they cause little or no vaginal bleeding, early small hematomas usually shrink on their own and do not affect your baby or pregnancy. When bleeding starts later in pregnancy, or if the hematoma is larger or occurs in older pregnant women, the condition may be more serious. Larger hematomas may get bigger, which increases the chances of miscarriage. This condition also increases the risk of: °· Premature separation of the placenta from the uterus. °· Premature (preterm) labor. °· Stillbirth. °What are the causes? °The exact cause of this condition is not known. It occurs when blood is trapped between the placenta and the uterine wall because the placenta has separated from the original site of implantation. °What increases the risk? °You are more likely to develop this condition if: °· You were treated with fertility medicines. °· You conceived through in vitro fertilization (IVF). °What are the signs or symptoms? °Symptoms of this condition include: °· Vaginal spotting or bleeding. °· Contractions of the uterus. These cause abdominal pain. °Sometimes you may have no symptoms and the bleeding may only be seen when ultrasound images are taken (transvaginal ultrasound). °How is this diagnosed? °This condition is diagnosed based on a physical exam. This includes a pelvic exam. You may also have other tests, including: °· Blood tests. °· Urine tests. °· Ultrasound of the abdomen. °How is this treated? °Treatment for this condition can vary. Treatment may include: °· Watchful waiting. You will be monitored closely for any changes in bleeding. During this stage: °? The hematoma may be reabsorbed by the body. °? The hematoma may separate the fluid-filled space containing the embryo (gestational sac) from the wall of the  womb (endometrium). °· Medicines. °· Activity restriction. This may be needed until the bleeding stops. °Follow these instructions at home: °· Stay on bed rest if told to do so by your health care provider. °· Do not lift anything that is heavier than 10 lbs. (4.5 kg) or as told by your health care provider. °· Do not use any products that contain nicotine or tobacco, such as cigarettes and e-cigarettes. If you need help quitting, ask your health care provider. °· Track and write down the number of pads you use each day and how soaked (saturated) they are. °· Do not use tampons. °· Keep all follow-up visits as told by your health care provider. This is important. Your health care provider may ask you to have follow-up blood tests or ultrasound tests or both. °Contact a health care provider if: °· You have any vaginal bleeding. °· You have a fever. °Get help right away if: °· You have severe cramps in your stomach, back, abdomen, or pelvis. °· You pass large clots or tissue. Save any tissue for your health care provider to look at. °· You have more vaginal bleeding, and you faint or become lightheaded or weak. °Summary °· A subchorionic hematoma is a gathering of blood between the outer wall of the placenta and the uterus. °· This condition can cause vaginal bleeding. °· Sometimes you may have no symptoms and the bleeding may only be seen when ultrasound images are taken. °· Treatment may include watchful waiting, medicines, or activity restriction. °This information is not intended to replace advice given to you by your health care provider. Make sure you discuss any questions you   have with your health care provider. °Document Released: 08/21/2006 Document Revised: 07/02/2016 Document Reviewed: 07/02/2016 °Elsevier Interactive Patient Education © 2019 [employer] Inc[employer] ° °

## 2018-09-23 NOTE — MAU Provider Note (Signed)
History     CSN: [id number]  Arrival date and time: 09/23/18 1400   First Provider Initiated Contact with Patient 09/23/18 1459      Chief Complaint  Patient presents with  . Vaginal Bleeding   Heather Lynch is a 33 y.o. [id number] at [redacted]w[redacted]d who presents today with vaginal bleeding. She was seen at the ED on 09/04/2018 and had IUP with [facility] on [place]. She states that she has been bleeding off and on since then. However in the last 2 days it has been worse. Today she had a large clot about the size of the quarter.   Vaginal Bleeding  The patient's primary symptoms include pelvic pain and vaginal bleeding. This is a new problem. The current episode started 1 to 4 weeks ago. The problem occurs constantly. The problem has been gradually worsening. Pain severity now: 3/10. The problem affects both sides. She is pregnant. Pertinent negatives include no chills, dysuria, fever, frequency, nausea or vomiting. The vaginal discharge was bloody. The vaginal bleeding is typical of menses. She has been passing clots. She has not been passing tissue. Nothing aggravates the symptoms. She has tried nothing for the symptoms.    OB History    Gravida  8   Para  7   Term  6   Preterm  1   AB      Living  7     SAB      TAB      Ectopic      Multiple      Live Births  7           Past Medical History:  Diagnosis Date  . Anemia   . Depression    refuses to take meds  . Gonorrhea   . Heart murmur   . Pneumonia   . Urinary tract infection     Past Surgical History:  Procedure Laterality Date  . CESAREAN SECTION     X 5    Family History  Problem Relation Age of Onset  . Hypertension Other   . Other Neg Hx     Social History   Tobacco Use  . Smoking status: Former Smoker    Last attempt to quit: 12/30/2011    Years since quitting: 6.7  . Smokeless tobacco: Never Used  Substance Use Topics  . Alcohol use: Not Currently    Comment: occasional  . Drug use: Not Currently     Types: Marijuana    Allergies: No Known Allergies  Medications Prior to Admission  Medication Sig Dispense Refill Last Dose  . metroNIDAZOLE (FLAGYL) 500 MG tablet Take 1 tablet (500 mg total) by mouth 2 (two) times daily. 14 tablet 0 Past Month at Unknown time  . Prenatal Vit-Fe Fumarate-FA (PRENATAL MULTIVITAMIN) TABS tablet Take 1 tablet by mouth daily at 12 noon.       Review of Systems  Constitutional: Negative for chills and fever.  Gastrointestinal: Negative for nausea and vomiting.  Genitourinary: Positive for pelvic pain and vaginal bleeding. Negative for dysuria and frequency.   Physical Exam   Blood pressure 126/70, pulse 81, temperature 98.6 F (37 C), resp. rate 18, last menstrual period 12/23/2017, currently breastfeeding.  Physical Exam  Nursing note and vitals reviewed. Constitutional: She is oriented to person, place, and time. She appears well-developed and well-nourished. No distress.  HENT:  Head: Normocephalic.  Cardiovascular: Normal rate.  Respiratory: Effort normal.  GI: Soft. There is no abdominal tenderness. There is no rebound.  Neurological: She is alert and oriented to person, place, and time.  Skin: Skin is warm and dry.  Psychiatric: She has a normal mood and affect.   Pt informed that the ultrasound is considered a limited OB ultrasound and is not intended to be a complete ultrasound exam.  Patient also informed that the ultrasound is not being completed with the intent of assessing for fetal or placental anomalies or any pelvic abnormalities.  Explained that the purpose of today's ultrasound is to assess for  viability.  Patient acknowledges the purpose of the exam and the limitations of the study.    +FHT 130  MAU Course  Procedures  MDM   Assessment and Plan   1. Vaginal bleeding in pregnancy, first trimester   2. [redacted] weeks gestation of pregnancy   3. Subchorionic hematoma in first trimester, single or unspecified fetus   4. Type O  blood, Rh positive    DC home Comfort measures reviewed  1stTrimester precautions  Bleeding precautions RX: no new rx  Return to MAU as needed FU with OB as planned  Follow-up Information    Keep your appointment with your OB Follow up.          Thressa ShellerHeather Hogan DNP, CNM  09/23/18  4:24 PM

## 2018-10-20 ENCOUNTER — Inpatient Hospital Stay (HOSPITAL_COMMUNITY): Payer: Medicaid Other

## 2018-10-20 ENCOUNTER — Inpatient Hospital Stay (HOSPITAL_COMMUNITY)
Admission: AD | Admit: 2018-10-20 | Discharge: 2018-10-21 | DRG: 770 | Disposition: A | Discharge status: Home / Self Care | Payer: Medicaid Other | Attending: Obstetrics & Gynecology | Admitting: Obstetrics & Gynecology

## 2018-10-20 ENCOUNTER — Inpatient Hospital Stay (HOSPITAL_COMMUNITY): Payer: Medicaid Other | Admitting: Certified Registered"

## 2018-10-20 ENCOUNTER — Encounter (HOSPITAL_COMMUNITY): Payer: Self-pay | Admitting: Nurse Practitioner

## 2018-10-20 ENCOUNTER — Other Ambulatory Visit: Payer: Self-pay

## 2018-10-20 ENCOUNTER — Encounter (HOSPITAL_COMMUNITY)
Admission: AD | Disposition: A | Discharge status: Home / Self Care | Payer: Self-pay | Source: Home / Self Care | Attending: Obstetrics & Gynecology

## 2018-10-20 DIAGNOSIS — O039 Complete or unspecified spontaneous abortion without complication: Secondary | ICD-10-CM

## 2018-10-20 DIAGNOSIS — O4692 Antepartum hemorrhage, unspecified, second trimester: Secondary | ICD-10-CM

## 2018-10-20 DIAGNOSIS — O034 Incomplete spontaneous abortion without complication: Secondary | ICD-10-CM | POA: Diagnosis present

## 2018-10-20 DIAGNOSIS — O34219 Maternal care for unspecified type scar from previous cesarean delivery: Secondary | ICD-10-CM | POA: Diagnosis present

## 2018-10-20 DIAGNOSIS — O9989 Other specified diseases and conditions complicating pregnancy, childbirth and the puerperium: Secondary | ICD-10-CM

## 2018-10-20 DIAGNOSIS — O4592 Premature separation of placenta, unspecified, second trimester: Secondary | ICD-10-CM

## 2018-10-20 DIAGNOSIS — Z87891 Personal history of nicotine dependence: Secondary | ICD-10-CM | POA: Diagnosis not present

## 2018-10-20 DIAGNOSIS — O9081 Anemia of the puerperium: Secondary | ICD-10-CM | POA: Diagnosis not present

## 2018-10-20 DIAGNOSIS — O09212 Supervision of pregnancy with history of pre-term labor, second trimester: Secondary | ICD-10-CM

## 2018-10-20 DIAGNOSIS — D62 Acute posthemorrhagic anemia: Secondary | ICD-10-CM | POA: Diagnosis not present

## 2018-10-20 DIAGNOSIS — Z1159 Encounter for screening for other viral diseases: Secondary | ICD-10-CM

## 2018-10-20 DIAGNOSIS — R109 Unspecified abdominal pain: Secondary | ICD-10-CM | POA: Diagnosis present

## 2018-10-20 DIAGNOSIS — Z3A17 17 weeks gestation of pregnancy: Secondary | ICD-10-CM | POA: Diagnosis not present

## 2018-10-20 DIAGNOSIS — O99212 Obesity complicating pregnancy, second trimester: Secondary | ICD-10-CM

## 2018-10-20 DIAGNOSIS — O209 Hemorrhage in early pregnancy, unspecified: Secondary | ICD-10-CM

## 2018-10-20 HISTORY — DX: Complete or unspecified spontaneous abortion without complication: O03.9

## 2018-10-20 HISTORY — PX: DILATION AND CURETTAGE OF UTERUS: SHX78

## 2018-10-20 LAB — PREPARE RBC (CROSSMATCH)

## 2018-10-20 LAB — DIC (DISSEMINATED INTRAVASCULAR COAGULATION)PANEL
D-Dimer, Quant: >20 ug/mL-FEU — ABNORMAL HIGH (ref 0.00–0.50)
Fibrinogen: 384 mg/dL (ref 210–475)
INR: 1.2 (ref 0.8–1.2)
Platelets: 269 10*3/uL (ref 150–400)
Prothrombin Time: 15.4 seconds — ABNORMAL HIGH (ref 11.4–15.2)
Smear Review: NONE SEEN
aPTT: 29 seconds (ref 24–36)

## 2018-10-20 LAB — POCT I-STAT EG7
Acid-base deficit: 5 mmol/L — ABNORMAL HIGH (ref 0.0–2.0)
Acid-base deficit: 3 mmol/L — ABNORMAL HIGH (ref 0.0–2.0)
Bicarbonate: 20.5 mmol/L (ref 20.0–28.0)
Bicarbonate: 22.1 mmol/L (ref 20.0–28.0)
Calcium, Ion: 1.12 mmol/L — ABNORMAL LOW (ref 1.15–1.40)
Calcium, Ion: 1.11 mmol/L — ABNORMAL LOW (ref 1.15–1.40)
HCT: 19 % — ABNORMAL LOW (ref 36.0–46.0)
HCT: 15 % — ABNORMAL LOW (ref 36.0–46.0)
Hemoglobin: 6.5 g/dL — CL (ref 12.0–15.0)
Hemoglobin: 5.1 g/dL — CL (ref 12.0–15.0)
O2 Saturation: 58 %
O2 Saturation: 59 %
Potassium: 3.9 mmol/L (ref 3.5–5.1)
Potassium: 4.8 mmol/L (ref 3.5–5.1)
Sodium: 135 mmol/L (ref 135–145)
Sodium: 135 mmol/L (ref 135–145)
TCO2: 22 mmol/L (ref 22–32)
TCO2: 23 mmol/L (ref 22–32)
pCO2, Ven: 41.3 mmHg — ABNORMAL LOW (ref 44.0–60.0)
pCO2, Ven: 38.8 mmHg — ABNORMAL LOW (ref 44.0–60.0)
pH, Ven: 7.304 (ref 7.250–7.430)
pH, Ven: 7.363 (ref 7.250–7.430)
pO2, Ven: 33 mmHg (ref 32.0–45.0)
pO2, Ven: 32 mmHg (ref 32.0–45.0)

## 2018-10-20 LAB — RPR: RPR Ser Ql: NONREACTIVE

## 2018-10-20 LAB — CBC
HCT: 26.7 % — ABNORMAL LOW (ref 36.0–46.0)
HCT: 24.1 % — ABNORMAL LOW (ref 36.0–46.0)
HCT: 23.4 % — ABNORMAL LOW (ref 36.0–46.0)
Hemoglobin: 8.3 g/dL — ABNORMAL LOW (ref 12.0–15.0)
Hemoglobin: 8 g/dL — ABNORMAL LOW (ref 12.0–15.0)
Hemoglobin: 8 g/dL — ABNORMAL LOW (ref 12.0–15.0)
MCH: 30.2 pg (ref 26.0–34.0)
MCH: 30.2 pg (ref 26.0–34.0)
MCH: 30.1 pg (ref 26.0–34.0)
MCHC: 31.1 g/dL (ref 30.0–36.0)
MCHC: 33.2 g/dL (ref 30.0–36.0)
MCHC: 34.2 g/dL (ref 30.0–36.0)
MCV: 97.1 fL (ref 80.0–100.0)
MCV: 90.9 fL (ref 80.0–100.0)
MCV: 88 fL (ref 80.0–100.0)
Platelets: 183 10*3/uL (ref 150–400)
Platelets: 261 10*3/uL (ref 150–400)
Platelets: 199 10*3/uL (ref 150–400)
RBC: 2.75 MIL/uL — ABNORMAL LOW (ref 3.87–5.11)
RBC: 2.65 MIL/uL — ABNORMAL LOW (ref 3.87–5.11)
RBC: 2.66 MIL/uL — ABNORMAL LOW (ref 3.87–5.11)
RDW: 14.7 % (ref 11.5–15.5)
RDW: 15.7 % — ABNORMAL HIGH (ref 11.5–15.5)
RDW: 16.4 % — ABNORMAL HIGH (ref 11.5–15.5)
WBC: 12.5 10*3/uL — ABNORMAL HIGH (ref 4.0–10.5)
WBC: 30.1 10*3/uL — ABNORMAL HIGH (ref 4.0–10.5)
WBC: 27.2 10*3/uL — ABNORMAL HIGH (ref 4.0–10.5)
nRBC: 0 % (ref 0.0–0.2)
nRBC: 0.1 % (ref 0.0–0.2)
nRBC: 0 % (ref 0.0–0.2)

## 2018-10-20 LAB — SARS CORONAVIRUS 2 BY RT PCR (HOSPITAL ORDER, PERFORMED IN ~~LOC~~ HOSPITAL LAB): SARS Coronavirus 2: NEGATIVE

## 2018-10-20 SURGERY — DILATION AND CURETTAGE
Anesthesia: General

## 2018-10-20 MED ORDER — FENTANYL CITRATE (PF) 100 MCG/2ML IJ SOLN
25.0000 ug | INTRAMUSCULAR | Status: DC | PRN
  Administered 2018-10-20: 50 ug via INTRAVENOUS

## 2018-10-20 MED ORDER — LIDOCAINE HCL (PF) 1 % IJ SOLN: 30.0000 mL | INTRAMUSCULAR | Status: DC | PRN

## 2018-10-20 MED ORDER — PROMETHAZINE HCL 25 MG/ML IJ SOLN
INTRAMUSCULAR | Status: AC
  Filled 2018-10-20: qty 1

## 2018-10-20 MED ORDER — TRANEXAMIC ACID 1000 MG/10ML IV SOLN
INTRAVENOUS | Status: DC | PRN
  Administered 2018-10-20: 1000 mg via INTRAVENOUS

## 2018-10-20 MED ORDER — LACTATED RINGERS IV SOLN
INTRAVENOUS | Status: DC | PRN
  Administered 2018-10-20: 06:00:00 via INTRAVENOUS

## 2018-10-20 MED ORDER — CALCIUM GLUCONATE 10 % IV SOLN
INTRAVENOUS | Status: DC | PRN
  Administered 2018-10-20: 1000 mg via INTRAVENOUS

## 2018-10-20 MED ORDER — ROCURONIUM BROMIDE 100 MG/10ML IV SOLN
INTRAVENOUS | Status: DC | PRN
  Administered 2018-10-20: 30 mg via INTRAVENOUS

## 2018-10-20 MED ORDER — ONDANSETRON HCL 4 MG/2ML IJ SOLN: 4.0000 mg | Freq: Four times a day (QID) | INTRAMUSCULAR | Status: DC | PRN

## 2018-10-20 MED ORDER — LACTATED RINGERS IV SOLN
INTRAVENOUS | Status: DC
  Administered 2018-10-20 (×2): via INTRAVENOUS

## 2018-10-20 MED ORDER — ONDANSETRON HCL 4 MG PO TABS: 4.0000 mg | ORAL_TABLET | Freq: Four times a day (QID) | ORAL | Status: DC | PRN

## 2018-10-20 MED ORDER — SODIUM CHLORIDE 0.9 % IV SOLN
INTRAVENOUS | Status: DC | PRN
  Administered 2018-10-20: 06:00:00 via INTRAVENOUS

## 2018-10-20 MED ORDER — FENTANYL CITRATE (PF) 250 MCG/5ML IJ SOLN
INTRAMUSCULAR | Status: AC
  Filled 2018-10-20: qty 5

## 2018-10-20 MED ORDER — FENTANYL CITRATE (PF) 100 MCG/2ML IJ SOLN
INTRAMUSCULAR | Status: DC | PRN
  Administered 2018-10-20: 50 ug via INTRAVENOUS
  Administered 2018-10-20: 100 ug via INTRAVENOUS
  Administered 2018-10-20 (×2): 50 ug via INTRAVENOUS

## 2018-10-20 MED ORDER — SUGAMMADEX SODIUM 200 MG/2ML IV SOLN
INTRAVENOUS | Status: DC | PRN
  Administered 2018-10-20: 200 mg via INTRAVENOUS

## 2018-10-20 MED ORDER — PROMETHAZINE HCL 25 MG/ML IJ SOLN
6.2500 mg | INTRAMUSCULAR | Status: DC | PRN
  Administered 2018-10-20: 12.5 mg via INTRAVENOUS

## 2018-10-20 MED ORDER — ONDANSETRON HCL 4 MG/2ML IJ SOLN
INTRAMUSCULAR | Status: DC | PRN
  Administered 2018-10-20: 4 mg via INTRAVENOUS

## 2018-10-20 MED ORDER — OXYTOCIN 40 UNITS IN NORMAL SALINE INFUSION - SIMPLE MED: 2.5000 [IU]/h | INTRAVENOUS | Status: DC

## 2018-10-20 MED ORDER — FENTANYL CITRATE (PF) 100 MCG/2ML IJ SOLN
INTRAMUSCULAR | Status: AC
  Filled 2018-10-20: qty 2

## 2018-10-20 MED ORDER — LACTATED RINGERS IV BOLUS: 1000.0000 mL | Freq: Once | INTRAVENOUS | Status: DC

## 2018-10-20 MED ORDER — SOD CITRATE-CITRIC ACID 500-334 MG/5ML PO SOLN
30.0000 mL | Freq: Once | ORAL | Status: AC
  Administered 2018-10-20: 30 mL via ORAL
  Filled 2018-10-20: qty 30

## 2018-10-20 MED ORDER — OXYCODONE-ACETAMINOPHEN 5-325 MG PO TABS
1.0000 | ORAL_TABLET | ORAL | Status: DC | PRN
  Filled 2018-10-20: qty 1

## 2018-10-20 MED ORDER — FAMOTIDINE IN NACL 20-0.9 MG/50ML-% IV SOLN
20.0000 mg | Freq: Once | INTRAVENOUS | Status: AC
  Administered 2018-10-20: 05:00:00 20 mg via INTRAVENOUS
  Filled 2018-10-20: qty 50

## 2018-10-20 MED ORDER — OXYCODONE HCL 5 MG PO TABS: 5.0000 mg | ORAL_TABLET | Freq: Once | ORAL | Status: DC | PRN

## 2018-10-20 MED ORDER — OXYTOCIN 10 UNIT/ML IJ SOLN
INTRAMUSCULAR | Status: AC
  Filled 2018-10-20: qty 1

## 2018-10-20 MED ORDER — SUCCINYLCHOLINE CHLORIDE 20 MG/ML IJ SOLN
INTRAMUSCULAR | Status: DC | PRN
  Administered 2018-10-20: 200 mg via INTRAVENOUS

## 2018-10-20 MED ORDER — LACTATED RINGERS IV SOLN
INTRAVENOUS | Status: DC
  Administered 2018-10-20 – 2018-10-21 (×2): via INTRAVENOUS

## 2018-10-20 MED ORDER — LACTATED RINGERS IV SOLN: INTRAVENOUS | Status: DC

## 2018-10-20 MED ORDER — ACETAMINOPHEN 10 MG/ML IV SOLN
INTRAVENOUS | Status: AC
  Filled 2018-10-20: qty 100

## 2018-10-20 MED ORDER — DOXYCYCLINE HYCLATE 100 MG IV SOLR
200.0000 mg | INTRAVENOUS | Status: AC
  Administered 2018-10-20: 200 mg via INTRAVENOUS
  Filled 2018-10-20 (×2): qty 200

## 2018-10-20 MED ORDER — SODIUM CHLORIDE 0.9 % IV SOLN
INTRAVENOUS | Status: DC | PRN
  Administered 2018-10-20: 30 ug/min via INTRAVENOUS

## 2018-10-20 MED ORDER — MIDAZOLAM HCL 2 MG/2ML IJ SOLN
INTRAMUSCULAR | Status: DC | PRN
  Administered 2018-10-20: 2 mg via INTRAVENOUS

## 2018-10-20 MED ORDER — OXYTOCIN 10 UNIT/ML IJ SOLN
INTRAMUSCULAR | Status: DC | PRN
  Administered 2018-10-20: 40 [IU] via INTRAMUSCULAR

## 2018-10-20 MED ORDER — FENTANYL CITRATE (PF) 100 MCG/2ML IJ SOLN: 50.0000 ug | INTRAMUSCULAR | Status: DC | PRN

## 2018-10-20 MED ORDER — ACETAMINOPHEN 325 MG PO TABS: 650.0000 mg | ORAL_TABLET | ORAL | Status: DC | PRN

## 2018-10-20 MED ORDER — LACTATED RINGERS IV SOLN: 500.0000 mL | INTRAVENOUS | Status: DC | PRN

## 2018-10-20 MED ORDER — METHYLERGONOVINE MALEATE 0.2 MG PO TABS: 0.2000 mg | ORAL_TABLET | ORAL | Status: DC | PRN

## 2018-10-20 MED ORDER — MIDAZOLAM HCL 2 MG/2ML IJ SOLN
INTRAMUSCULAR | Status: AC
  Filled 2018-10-20: qty 2

## 2018-10-20 MED ORDER — ACETAMINOPHEN 325 MG PO TABS
650.0000 mg | ORAL_TABLET | Freq: Once | ORAL | Status: AC
  Administered 2018-10-20: 650 mg via ORAL
  Filled 2018-10-20: qty 2

## 2018-10-20 MED ORDER — METHYLERGONOVINE MALEATE 0.2 MG/ML IJ SOLN
INTRAMUSCULAR | Status: AC
  Filled 2018-10-20: qty 1

## 2018-10-20 MED ORDER — PHENYLEPHRINE HCL (PRESSORS) 10 MG/ML IV SOLN
INTRAVENOUS | Status: DC | PRN
  Administered 2018-10-20: 80 mg via INTRAVENOUS

## 2018-10-20 MED ORDER — OXYTOCIN 10 UNIT/ML IJ SOLN
10.0000 [IU] | Freq: Once | INTRAMUSCULAR | Status: AC
  Administered 2018-10-20: 10 [IU] via INTRAMUSCULAR

## 2018-10-20 MED ORDER — MISOPROSTOL 200 MCG PO TABS
ORAL_TABLET | ORAL | Status: AC
  Administered 2018-10-20: 800 ug via RECTAL
  Filled 2018-10-20: qty 4

## 2018-10-20 MED ORDER — SOD CITRATE-CITRIC ACID 500-334 MG/5ML PO SOLN
ORAL | Status: AC
  Filled 2018-10-20: qty 30

## 2018-10-20 MED ORDER — OXYCODONE HCL 5 MG/5ML PO SOLN: 5.0000 mg | Freq: Once | ORAL | Status: DC | PRN

## 2018-10-20 MED ORDER — PROPOFOL 10 MG/ML IV BOLUS
INTRAVENOUS | Status: DC | PRN
  Administered 2018-10-20: 200 mg via INTRAVENOUS

## 2018-10-20 MED ORDER — METHYLERGONOVINE MALEATE 0.2 MG/ML IJ SOLN
0.2000 mg | INTRAMUSCULAR | Status: DC | PRN
  Administered 2018-10-20: 0.2 mg via INTRAMUSCULAR

## 2018-10-20 MED ORDER — ACETAMINOPHEN 10 MG/ML IV SOLN
1000.0000 mg | Freq: Once | INTRAVENOUS | Status: DC | PRN
  Administered 2018-10-20: 1000 mg via INTRAVENOUS

## 2018-10-20 MED ORDER — DIPHENHYDRAMINE HCL 25 MG PO CAPS
25.0000 mg | ORAL_CAPSULE | Freq: Once | ORAL | Status: AC
  Administered 2018-10-20: 25 mg via ORAL
  Filled 2018-10-20: qty 1

## 2018-10-20 MED ORDER — OXYTOCIN BOLUS FROM INFUSION: 500.0000 mL | Freq: Once | INTRAVENOUS | Status: DC

## 2018-10-20 MED ORDER — METHYLERGONOVINE MALEATE 0.2 MG/ML IJ SOLN
INTRAMUSCULAR | Status: DC | PRN
  Administered 2018-10-20: 0.2 mg via INTRAMUSCULAR

## 2018-10-20 MED ORDER — LIDOCAINE HCL (PF) 2 % IJ SOLN
INTRAMUSCULAR | Status: DC | PRN
  Administered 2018-10-20: 100 mg via INTRADERMAL

## 2018-10-20 MED ORDER — CALCIUM GLUCONATE-NACL 1-0.675 GM/50ML-% IV SOLN
INTRAVENOUS | Status: AC
  Filled 2018-10-20: qty 50

## 2018-10-20 MED ORDER — SOD CITRATE-CITRIC ACID 500-334 MG/5ML PO SOLN
ORAL | Status: AC
  Administered 2018-10-20: 30 mL via ORAL
  Filled 2018-10-20: qty 30

## 2018-10-20 MED ORDER — MISOPROSTOL 200 MCG PO TABS
800.0000 ug | ORAL_TABLET | Freq: Once | ORAL | Status: AC
  Administered 2018-10-20: 800 ug via RECTAL

## 2018-10-20 MED ORDER — DEXAMETHASONE SODIUM PHOSPHATE 10 MG/ML IJ SOLN
INTRAMUSCULAR | Status: DC | PRN
  Administered 2018-10-20: 4 mg via INTRAVENOUS

## 2018-10-20 MED ORDER — SOD CITRATE-CITRIC ACID 500-334 MG/5ML PO SOLN: 30.0000 mL | ORAL | Status: DC | PRN

## 2018-10-20 SURGICAL SUPPLY — 11 items
CATH ROBINSON RED A/P 16FR (CATHETERS) ×3 IMPLANT
CLOTH BEACON ORANGE TIMEOUT ST (SAFETY) ×3 IMPLANT
GLOVE BIO SURGEON STRL SZ 6.5 (GLOVE) ×2 IMPLANT
GLOVE BIO SURGEONS STRL SZ 6.5 (GLOVE) ×1
GLOVE BIOGEL PI IND STRL 7.0 (GLOVE) ×2 IMPLANT
GLOVE BIOGEL PI INDICATOR 7.0 (GLOVE) ×4
GOWN STRL REUS W/TWL LRG LVL3 (GOWN DISPOSABLE) ×9 IMPLANT
NS IRRIG 1000ML POUR BTL (IV SOLUTION) ×3 IMPLANT
PACK VAGINAL MINOR WOMEN LF (CUSTOM PROCEDURE TRAY) ×3 IMPLANT
PAD OB MATERNITY 4.3X12.25 (PERSONAL CARE ITEMS) ×3 IMPLANT
PAD PREP 24X48 CUFFED NSTRL (MISCELLANEOUS) ×3 IMPLANT

## 2018-10-20 NOTE — MAU Note (Signed)
TO OR VIA STRETCHER FOR D AND C - TO REMOVE PLACENTA

## 2018-10-20 NOTE — Progress Notes (Signed)
3818 pt c/o dizziness and feeling faint. BP 76/37 SPO2 99% HR 103.  RN flattened head of bed, cool wash rag given. Iv fluids per post op order initiated. Serial BP initiated. Symptoms subsided after interventions.

## 2018-10-20 NOTE — Progress Notes (Signed)
Introduced spiritual care services and offered support to pt and fiance upon the loss of their son.  Pt shared the story of her delivery and her subsequent grief.  We processed her fear that her thoughts had contributed to the early delivery of her son.  Prayed with patient for peace and reassurance to be able to let herself fall asleep despite her fear that her heart rate will drop and she'll die in her sleep.  Please page as further needs arise.  Heather Lynch. Carley Hammed, M.Div. [facility] Chaplain Pager [phone] Office [phone]       10/20/18 1310  Clinical Encounter Type  Visited With Patient and family together  Visit Type Initial;Spiritual support  Referral From Nurse  Stress Factors  Patient Stress Factors Loss

## 2018-10-20 NOTE — MAU Note (Signed)
5498-  DELIVERED FETUS

## 2018-10-20 NOTE — Progress Notes (Signed)
Responded to PIV consult. Pt on phone and requests return at a later time.

## 2018-10-20 NOTE — Anesthesia Procedure Notes (Signed)
Procedure Name: Intubation Date/Time: 10/20/2018 5:30 AM Performed by: Jennelle Human, CRNA Pre-anesthesia Checklist: Patient identified, Emergency Drugs available, Suction available, Patient being monitored and Timeout performed Patient Re-evaluated:Patient Re-evaluated prior to induction Oxygen Delivery Method: Circle system utilized Preoxygenation: Pre-oxygenation with 100% oxygen Induction Type: IV induction and Rapid sequence Laryngoscope Size: Glidescope and 3 Grade View: Grade I Tube type: Oral Tube size: 7.0 mm Number of attempts: 1 Airway Equipment and Method: Stylet and Video-laryngoscopy Placement Confirmation: ETT inserted through vocal cords under direct vision,  positive ETCO2,  CO2 detector and breath sounds checked- equal and bilateral Secured at: 22 cm Tube secured with: Tape Dental Injury: Teeth and Oropharynx as per pre-operative assessment

## 2018-10-20 NOTE — MAU Note (Addendum)
Was seen a few weeks ago and told she had a threatened SAB d/t subchorionic hemorrhage.  About 30-45 mins ago started having severe lower abdominal pain and bleeding-passed 2 clots.  Reports her bleeding has increased to the amount of a period.  States the pain is intermittent and she feels a lot of pressure.  Hasn't taken anything for the pain.

## 2018-10-20 NOTE — Progress Notes (Signed)
Dr Adrian Blackwater notified of post transfusion H&H. CBC ordered for tomorrow morning.

## 2018-10-20 NOTE — Anesthesia Postprocedure Evaluation (Signed)
Anesthesia Post Note  Patient: [facility]  Procedure(s) Performed: DILATATION AND CURETTAGE (N/A )     Patient location during evaluation: PACU Anesthesia Type: General Level of consciousness: awake and alert Pain management: pain level controlled Vital Signs Assessment: post-procedure vital signs reviewed and stable Respiratory status: spontaneous breathing, nonlabored ventilation and respiratory function stable Cardiovascular status: blood pressure returned to baseline and stable Postop Assessment: no apparent nausea or vomiting Anesthetic complications: no                    Kaylyn Layer

## 2018-10-20 NOTE — Progress Notes (Signed)
Dr Jolayne Panther notified of temperatures trending upward. Current temp 100.9. Rn continuing to monitor.

## 2018-10-20 NOTE — MAU Note (Signed)
Patient refused COVID swab

## 2018-10-20 NOTE — Discharge Summary (Signed)
OB Discharge Summary     Patient Name: Heather Lynch DOB: 01/03/1986 MRN: [id number]  Date of admission: 10/20/2018 Delivering MD: Gerrit HeckEMLY, JESSICA   Date of discharge: 10/21/2018  Admitting diagnosis: transfer, 18wks passing clots, pain and pressure, leg pain Intrauterine pregnancy: 5271w3d     Secondary diagnosis:  Active Problems:   Retained products of conception after miscarriage   Spontaneous abortion   Preterm delivery  Additional problems: postpartum hemorrhage     Discharge diagnosis: Previable preterm delivery with postpartum hemorrhage                                                                                        Post partum procedures:blood transfusion  Hospital course:  Onset of Labor With Vaginal Delivery     33 y.o. yo [id number] at 371w3d was admitted in Active Labor on 10/20/2018. Patient had an uncomplicated labor course as follows:  Membrane Rupture Time/Date:   ,10/20/2018   Intrapartum Procedures: Episiotomy: None [1]                                         Lacerations:  None [1]  Patient had a delivery of a Non Viable infant. 10/20/2018  Information for the patient's newborn:  [place], PendingBaby FD [[id number]][[id number]]  Delivery Method: Vaginal, Spontaneous(Filed from Delivery Summary)    Pateint had a postpartum course complicated by anemia due to acute blood loss. She received 3 units on postpartum unit.  She is ambulating, tolerating a regular diet, passing flatus, and urinating well. Patient is discharged home in stable condition on 10/21/18. She is discharged with a prescription for Iron and colace. Discharge instructions were reviewed with the patient   Physical exam  Vitals:   10/21/18 0740 10/21/18 0939 10/21/18 1004 10/21/18 1217  BP: (!) 100/56 (!) 100/48 114/84 116/60  Pulse: 96 86 88 93  Resp: 18 18 18 18   Temp: 98.4 F (36.9 C) 98 F (36.7 C) 98.6 F (37 C) 97.9 F (36.6 C)  TempSrc: Oral Oral Oral Oral  SpO2: 100% 99% 100% 100%  Weight:        General: alert, cooperative and no distress Lochia: appropriate Uterine Fundus: firm DVT Evaluation: No evidence of DVT seen on physical exam. Labs: Lab Results  Component Value Date   WBC 21.7 (H) 10/21/2018   HGB 7.3 (L) 10/21/2018   HCT 21.4 (L) 10/21/2018   MCV 87.9 10/21/2018   PLT 181 10/21/2018   CMP Latest Ref Rng & Units 10/20/2018  Glucose 70 - 99 mg/dL -  BUN 6 - 20 mg/dL -  Creatinine 4.740.44 - 2.591.00 mg/dL -  Sodium 563135 - 875145 mmol/L 135  Potassium 3.5 - 5.1 mmol/L 4.8  Chloride 98 - 111 mmol/L -  CO2 22 - 32 mmol/L -  Calcium 8.9 - 10.3 mg/dL -  Total Protein 6.5 - 8.1 g/dL -  Total Bilirubin 0.3 - 1.2 mg/dL -  Alkaline Phos 38 - 643126 U/L -  AST 15 - 41 U/L -  ALT 14 - 54 U/L -  Discharge instruction: per After Visit Summary and "Baby and Me Booklet".  After visit meds:  Allergies as of 10/21/2018   No Known Allergies     Medication List    TAKE these medications   docusate sodium 100 MG capsule Commonly known as:  COLACE Take 1 capsule (100 mg total) by mouth 2 (two) times daily.   ferrous sulfate 325 (65 FE) MG tablet Commonly known as:  FerrouSul Take 1 tablet (325 mg total) by mouth 2 (two) times daily.   ibuprofen 600 MG tablet Commonly known as:  ADVIL Take 1 tablet (600 mg total) by mouth every 6 (six) hours as needed.   prenatal multivitamin Tabs tablet Take 1 tablet by mouth daily at 12 noon.       Diet: routine diet  Activity: Advance as tolerated. Pelvic rest for 6 weeks.   Outpatient follow up:4 weeks Follow up Appt:No future appointments. Follow up Visit:No follow-ups on file.  Postpartum contraception: Undecided  Newborn Data: Non viable born female  Birth Weight: 5.3 oz (150 g) APGAR: ,   Newborn Delivery   Birth date/time:  10/20/2018 03:05:00 Delivery type:  Vaginal, Spontaneous     10/21/2018 Catalina Antigua, MD

## 2018-10-20 NOTE — Op Note (Signed)
Heather Lynch PROCEDURE DATE: 10/20/2018  PREOPERATIVE DIAGNOSIS: [redacted]w[redacted]d week incomplete abortion POSTOPERATIVE DIAGNOSIS: The same PROCEDURE:     Dilation and Evacuation SURGEON:  Scheryl Darter MD  INDICATIONS: 33 y.o. [phone] with SAB at [redacted]w[redacted]d weeks gestation, needing surgical completion.  Risks of surgery were discussed with the patient including but not limited to: bleeding which may require transfusion; infection which may require antibiotics; injury to uterus or surrounding organs; need for additional procedures including laparotomy or laparoscopy; possibility of intrauterine scarring which may impair future fertility; and other postoperative/anesthesia complications. Written informed consent was obtained.    FINDINGS:  A 8-10 week size uterus, moderate amounts of products of conception, specimen sent to pathology.  ANESTHESIA:    GET INTRAVENOUS FLUIDS:  800 ml of LR ESTIMATED BLOOD LOSS:  500 ml. SPECIMENS:  Products of conception sent to pathology COMPLICATIONS:  None immediate. PRBC: 2 units  PROCEDURE DETAILS:  The patient received intravenous Doxycycline while in the preoperative area.  She was then taken to the operating room where monitored intravenous sedation was administered and was found to be adequate.  After an adequate timeout was performed, she was placed in the dorsal lithotomy position and examined; then prepped and draped in the sterile manner.   Her bladder was catheterized for an unmeasured amount of clear, yellow urine. A vaginal speculum was then placed in the patient's vagina and a single tooth tenaculum was applied to the anterior lip of the cervix. The cervix was  dilated to accommodate a 16 mm suction curette that was gently advanced to the uterine fundus.  The suction device was then activated and curette slowly rotated to clear the uterus of products of conception.  A sharp curettage was then performed to confirm complete emptying of the uterus. There was  minimal bleeding noted and the tenaculum removed with good hemostasis noted. She received IV Pitocin, tranexamic acid and Metthergine.  All instruments were removed from the patient's vagina.  Sponge and instrument counts were correct times two  The patient tolerated the procedure well and was taken to the recovery area awake, and in stable condition.  Adam Phenix, MD 10/20/2018 6:20 AM

## 2018-10-20 NOTE — Transfer of Care (Signed)
Immediate Anesthesia Transfer of Care Note  Patient: [facility]  Procedure(s) Performed: DILATATION AND CURETTAGE (N/A )  Patient Location: PACU  Anesthesia Type:General  Level of Consciousness: awake, alert  and oriented  Airway & Oxygen Therapy: Patient Spontanous Breathing  Post-op Assessment: Report given to RN and Post -op Vital signs reviewed and stable  Post vital signs: Reviewed and stable  Last Vitals:  Vitals Value Taken Time  BP 127/70 10/20/2018  6:48 AM  Temp    Pulse 131 10/20/2018  6:55 AM  Resp 20 10/20/2018  6:55 AM  SpO2 100 % 10/20/2018  6:55 AM  Vitals shown include unvalidated device data.  Last Pain:  Vitals:   10/20/18 0146  PainSc: 8          Complications: No apparent anesthesia complications

## 2018-10-20 NOTE — H&P (Addendum)
Heather Lynch is a 33 y.o. female [phone] at 17.3 weeks presenting for abdominal pain. [facility] but has [place].  [facility] and has been having bleeding since her diagnosis.  She reports today she passed a clot the size of her hand.     OB History    Gravida  8   Para  7   Term  6   Preterm  1   AB      Living  7     SAB      TAB      Ectopic      Multiple      Live Births  7          Past Medical History:  Diagnosis Date  . Anemia   . Depression    refuses to take meds  . Gonorrhea   . Heart murmur   . Pneumonia   . Urinary tract infection    Past Surgical History:  Procedure Laterality Date  . CESAREAN SECTION     X 5   Family History: family history includes Hypertension in an other family member. Social History:  reports that she quit smoking about 6 years ago. She has never used smokeless tobacco. She reports previous alcohol use. She reports previous drug use. Drug: Marijuana.     Maternal Diabetes: No Genetic Screening: None Maternal Ultrasounds/Referrals: None Fetal Ultrasounds or other Referrals:  None Maternal Substance Abuse:  No Significant Maternal Medications:  None Significant Maternal Lab Results:  None Other Comments:  None  Review of Systems  Constitutional: Negative for chills and fever.  Respiratory: Negative for cough and shortness of breath.   Gastrointestinal: Positive for abdominal pain.  Musculoskeletal: Positive for back pain.   Maternal Medical History:  Reason for admission: Contractions and vaginal bleeding.   Contractions: Onset was 1-2 hours ago.   Frequency: regular.        Blood pressure 126/71, pulse 100, temperature 98.6 F (37 C), resp. rate (!) 22, weight (!) 153.9 kg, last menstrual period 12/23/2017, currently breastfeeding. Exam Physical Exam  Constitutional: She is oriented to person, place, and time. She appears well-developed and  well-nourished. She appears distressed (With contractions).  HENT:  Head: Normocephalic and atraumatic.  Eyes: Conjunctivae are normal.  Neck: Normal range of motion.  Cardiovascular: Normal rate, regular rhythm and normal heart sounds.  Respiratory: Effort normal and breath sounds normal.  GI: Soft.  Genitourinary:    Vaginal bleeding present.  There is bleeding in the vagina.    Genitourinary Comments: Sterile Speculum Exam: -Vaginal Vault: Pink mucosa.  Moderate amt blood in vault removed with 2 faux swabs. -Cervix:Appears open with membranes vs cephalic presenting. GC/CT collected -Bimanual Exam:    Musculoskeletal: Normal range of motion.  Neurological: She is oriented to person, place, and time.  Skin: Skin is warm and dry.  Psychiatric: She has a normal mood and affect. Her behavior is normal.    Prenatal labs: ABO, Rh: --/--/O POS (04/17 0225) Antibody:  Pending Rubella:   RPR:   Pending HBsAg:   Unknown HIV:   Unknown GBS:   Unknown  Assessment/Plan: 33 y.o. [id number] SIUP at 17.3 weeks PTL-1st Trimester Vaginal Bleeding   -Exam findings discussed. -Informed that labor would likely progress to delivery. -Questions ability to stop labor and informed that due to gestational age and previous ultrasounds showing Large SCH-intervention unlikely.  However, would consult with MD. -Patient  tearful and support given. -Dr. Marcie Bal consulted and advises; *BSUS to determine presenting part *Admit to unit  -Routine Labor and Delivery Orders placed.   Follow Up (2:54 AM)  -[place] reports cephalic presentation with apparent abruption.   Follow Up (3:05 AM) -Patient with imminent delivery prior to transfer. -Provider at bedside  Cherre Robins MSN, CNM 10/20/2018, 2:35 AM

## 2018-10-20 NOTE — Anesthesia Preprocedure Evaluation (Addendum)
Anesthesia Evaluation  Patient identified by MRN, date of birth, ID band Patient awake    Reviewed: Allergy & Precautions, NPO status , Patient's Chart, lab work & pertinent test results  History of Anesthesia Complications Negative for: history of anesthetic complications  Airway Mallampati: II  TM Distance: >3 FB Neck ROM: Full    Dental no notable dental hx.    Pulmonary former smoker,    Pulmonary exam normal        Cardiovascular negative cardio ROS Normal cardiovascular exam     Neuro/Psych negative neurological ROS     GI/Hepatic negative GI ROS, Neg liver ROS,   Endo/Other  negative endocrine ROS  Renal/GU negative Renal ROS     Musculoskeletal negative musculoskeletal ROS (+)   Abdominal   Peds  Hematology  (+) anemia , Hgb 8.3   Anesthesia Other Findings Day of surgery medications reviewed with the patient.  Reproductive/Obstetrics Hx of C/S x6, now with retained POC s/p 17w fetal demise                            Anesthesia Physical Anesthesia Plan  ASA: III and emergent  Anesthesia Plan: General   Post-op Pain Management:    Induction: Intravenous, Rapid sequence and Cricoid pressure planned  PONV Risk Score and Plan: 3 and Treatment may vary due to age or medical condition, Ondansetron, Dexamethasone and Midazolam  Airway Management Planned: Oral ETT  Additional Equipment:   Intra-op Plan:   Post-operative Plan: Extubation in OR  Informed Consent: I have reviewed the patients History and Physical, chart, labs and discussed the procedure including the risks, benefits and alternatives for the proposed anesthesia with the patient or authorized representative who has indicated his/her understanding and acceptance.     Dental advisory given  Plan Discussed with: CRNA  Anesthesia Plan Comments:        Anesthesia Quick Evaluation

## 2018-10-21 ENCOUNTER — Encounter (HOSPITAL_COMMUNITY): Payer: Self-pay

## 2018-10-21 LAB — CBC
HCT: 19.6 % — ABNORMAL LOW (ref 36.0–46.0)
Hemoglobin: 6.6 g/dL — CL (ref 12.0–15.0)
MCH: 29.6 pg (ref 26.0–34.0)
MCHC: 33.7 g/dL (ref 30.0–36.0)
MCV: 87.9 fL (ref 80.0–100.0)
Platelets: 181 10*3/uL (ref 150–400)
RBC: 2.23 MIL/uL — ABNORMAL LOW (ref 3.87–5.11)
RDW: 17.1 % — ABNORMAL HIGH (ref 11.5–15.5)
WBC: 21.7 10*3/uL — ABNORMAL HIGH (ref 4.0–10.5)
nRBC: 0 % (ref 0.0–0.2)

## 2018-10-21 LAB — HEMOGLOBIN AND HEMATOCRIT, BLOOD
HCT: 21.4 % — ABNORMAL LOW (ref 36.0–46.0)
Hemoglobin: 7.3 g/dL — ABNORMAL LOW (ref 12.0–15.0)

## 2018-10-21 LAB — PREPARE RBC (CROSSMATCH)

## 2018-10-21 MED ORDER — ACETAMINOPHEN 325 MG PO TABS
650.0000 mg | ORAL_TABLET | Freq: Once | ORAL | Status: AC
  Administered 2018-10-21: 650 mg via ORAL
  Filled 2018-10-21: qty 2

## 2018-10-21 MED ORDER — DOCUSATE SODIUM 100 MG PO CAPS: 100.0000 mg | ORAL_CAPSULE | Freq: Two times a day (BID) | ORAL | 2 refills | Status: DC

## 2018-10-21 MED ORDER — SODIUM CHLORIDE 0.9% IV SOLUTION
Freq: Once | INTRAVENOUS | Status: AC
  Administered 2018-10-21: 09:00:00 via INTRAVENOUS

## 2018-10-21 MED ORDER — FERROUS SULFATE 325 (65 FE) MG PO TABS: 325.0000 mg | ORAL_TABLET | Freq: Two times a day (BID) | ORAL | 1 refills | Status: DC

## 2018-10-21 MED ORDER — DIPHENHYDRAMINE HCL 25 MG PO CAPS
25.0000 mg | ORAL_CAPSULE | Freq: Once | ORAL | Status: AC
  Administered 2018-10-21: 25 mg via ORAL
  Filled 2018-10-21: qty 1

## 2018-10-21 MED ORDER — IBUPROFEN 600 MG PO TABS: 600.0000 mg | ORAL_TABLET | Freq: Four times a day (QID) | ORAL | 3 refills | Status: DC | PRN

## 2018-10-21 NOTE — Progress Notes (Signed)
Discharge teaching complete with pt. Pt understood all information and did not have any questions. 

## 2018-10-21 NOTE — Discharge Instructions (Signed)
Vaginal Delivery, Care After °Refer to this sheet in the next few weeks. These instructions provide you with information about caring for yourself after vaginal delivery. Your health care provider may also give you more specific instructions. Your treatment has been planned according to current medical practices, but problems sometimes occur. Call your health care provider if you have any problems or questions. °What can I expect after the procedure? °After vaginal delivery, it is common to have: °· Some bleeding from your vagina. °· Soreness in your abdomen, your vagina, and the area of skin between your vaginal opening and your anus (perineum). °· Pelvic cramps. °· Fatigue. °Follow these instructions at home: °Medicines °· Take over-the-counter and prescription medicines only as told by your health care provider. °· If you were prescribed an antibiotic medicine, take it as told by your health care provider. Do not stop taking the antibiotic until it is finished. °Driving ° °· Do not drive or operate heavy machinery while taking prescription pain medicine. °· Do not drive for 24 hours if you received a sedative. °Lifestyle °· Do not drink alcohol. This is especially important if you are breastfeeding or taking medicine to relieve pain. °· Do not use tobacco products, including cigarettes, chewing tobacco, or e-cigarettes. If you need help quitting, ask your health care provider. °Eating and drinking °· Drink at least 8 eight-ounce glasses of water every day unless you are told not to by your health care provider. If you choose to breastfeed your baby, you may need to drink more water than this. °· Eat high-fiber foods every day. These foods may help prevent or relieve constipation. High-fiber foods include: °? Whole grain cereals and breads. °? Brown rice. °? Beans. °? Fresh fruits and vegetables. °Activity °· Return to your normal activities as told by your health care provider. Ask your health care provider what  activities are safe for you. °· Rest as much as possible. Try to rest or take a nap when your baby is sleeping. °· Do not lift anything that is heavier than your baby or 10 lb (4.5 kg) until your health care provider says that it is safe. °· Talk with your health care provider about when you can engage in sexual activity. This may depend on your: °? Risk of infection. °? Rate of healing. °? Comfort and desire to engage in sexual activity. °Vaginal Care °· If you have an episiotomy or a vaginal tear, check the area every day for signs of infection. Check for: °? More redness, swelling, or pain. °? More fluid or blood. °? Warmth. °? Pus or a bad smell. °· Do not use tampons or douches until your health care provider says this is safe. °· Watch for any blood clots that may pass from your vagina. These may look like clumps of dark red, brown, or black discharge. °General instructions °· Keep your perineum clean and dry as told by your health care provider. °· Wear loose, comfortable clothing. °· Wipe from front to back when you use the toilet. °· Ask your health care provider if you can shower or take a bath. If you had an episiotomy or a perineal tear during labor and delivery, your health care provider may tell you not to take baths for a certain length of time. °· Wear a bra that supports your breasts and fits you well. °· If possible, have someone help you with household activities and help care for your baby for at least a few days after you   leave the hospital. °· Keep all follow-up visits for you and your baby as told by your health care provider. This is important. °Contact a health care provider if: °· You have: °? Vaginal discharge that has a bad smell. °? Difficulty urinating. °? Pain when urinating. °? A sudden increase or decrease in the frequency of your bowel movements. °? More redness, swelling, or pain around your episiotomy or vaginal tear. °? More fluid or blood coming from your episiotomy or vaginal  tear. °? Pus or a bad smell coming from your episiotomy or vaginal tear. °? A fever. °? A rash. °? Little or no interest in activities you used to enjoy. °? Questions about caring for yourself or your baby. °· Your episiotomy or vaginal tear feels warm to the touch. °· Your episiotomy or vaginal tear is separating or does not appear to be healing. °· Your breasts are painful, hard, or turn red. °· You feel unusually sad or worried. °· You feel nauseous or you vomit. °· You pass large blood clots from your vagina. If you pass a blood clot from your vagina, save it to show to your health care provider. Do not flush blood clots down the toilet without having your health care provider look at them. °· You urinate more than usual. °· You are dizzy or light-headed. °· You have not breastfed at all and you have not had a menstrual period for 12 weeks after delivery. °· You have stopped breastfeeding and you have not had a menstrual period for 12 weeks after you stopped breastfeeding. °Get help right away if: °· You have: °? Pain that does not go away or does not get better with medicine. °? Chest pain. °? Difficulty breathing. °? Blurred vision or spots in your vision. °? Thoughts about hurting yourself or your baby. °· You develop pain in your abdomen or in one of your legs. °· You develop a severe headache. °· You faint. °· You bleed from your vagina so much that you fill two sanitary pads in one hour. °This information is not intended to replace advice given to you by your health care provider. Make sure you discuss any questions you have with your health care provider. °Document Released: 05/03/2000 Document Revised: 10/18/2015 Document Reviewed: 05/21/2015 °Elsevier Interactive Patient Education © 2019 [employer] ° °

## 2018-10-21 NOTE — Progress Notes (Signed)
Follow up visit with pt and fiance.  She reports having difficulty sleeping, but that the Benadryl seems to help.  She says she thought she was supposed to be discharged today, but is a little anxious because her heart is racing when she stands up. She doesn't want to go home and get sick.  WE talked about plans for disposition of the baby and the family thinks they'll use [employer] for cremation.  We also discussed possibilities for memorial service and cremains and I offered resources for those and for grief support through spiritual care or [facility].  Pt shared that she is pretty certain her son was born with a heart beat and inquired about whether he would get a death certificate.  Consulted with AC, Raquel Sarna who will follow up.  Please page as further needs arise.  Maryanna Shape. Carley Hammed, M.Div. [facility] Chaplain Pager [phone] Office [phone]

## 2018-10-21 NOTE — Progress Notes (Signed)
Attempted to follow up but pt and fiance were sleeping.    Please page as further needs arise.  Maryanna Shape. Carley Hammed, M.Div. [facility] Chaplain Pager [phone] Office [phone]

## 2018-10-21 NOTE — Progress Notes (Signed)
Critical Lab Value: Hemoglobin 6.6 taken @ 0541 10/21/18  MD called, deferred to CNM. Wynelle Bourgeois CNM notified.

## 2018-10-22 ENCOUNTER — Emergency Department (HOSPITAL_COMMUNITY)
Admission: EM | Admit: 2018-10-22 | Discharge: 2018-10-22 | Discharge status: Absent without leave | Payer: Medicaid Other

## 2018-10-22 ENCOUNTER — Encounter (HOSPITAL_COMMUNITY): Payer: Self-pay

## 2018-10-22 ENCOUNTER — Other Ambulatory Visit: Payer: Self-pay

## 2018-10-22 ENCOUNTER — Inpatient Hospital Stay (HOSPITAL_COMMUNITY)
Admission: AD | Admit: 2018-10-22 | Discharge: 2018-10-23 | Disposition: A | Discharge status: Home Health | Payer: Medicaid Other | Attending: Family Medicine | Admitting: Family Medicine

## 2018-10-22 DIAGNOSIS — I8002 Phlebitis and thrombophlebitis of superficial vessels of left lower extremity: Secondary | ICD-10-CM | POA: Insufficient documentation

## 2018-10-22 DIAGNOSIS — J9589 Other postprocedural complications and disorders of respiratory system, not elsewhere classified: Secondary | ICD-10-CM | POA: Insufficient documentation

## 2018-10-22 DIAGNOSIS — D500 Iron deficiency anemia secondary to blood loss (chronic): Secondary | ICD-10-CM | POA: Insufficient documentation

## 2018-10-22 LAB — TYPE AND SCREEN
ABO/RH(D): O POS
Antibody Screen: NEGATIVE
Unit division: 0
Unit division: 0
Unit division: 0
Unit division: 0
Unit division: 0

## 2018-10-22 LAB — BPAM RBC
Blood Product Expiration Date: 202006232359
Blood Product Expiration Date: 202006232359
Blood Product Expiration Date: 202006232359
Blood Product Expiration Date: 202006232359
Blood Product Expiration Date: 202006302359
ISSUE DATE / TIME: 202006020449
ISSUE DATE / TIME: 202006020449
ISSUE DATE / TIME: 202006021102
ISSUE DATE / TIME: 202006021426
ISSUE DATE / TIME: 202006030943
Unit Type and Rh: 5100
Unit Type and Rh: 5100
Unit Type and Rh: 5100
Unit Type and Rh: 5100
Unit Type and Rh: 5100

## 2018-10-22 NOTE — MAU Note (Signed)
Pt states that she is experiencing swelling in her left leg that she states is warm to the touch.   Pt reports when she is breathing something feels lose in her throat possibly mucous.

## 2018-10-23 DIAGNOSIS — M7989 Other specified soft tissue disorders: Secondary | ICD-10-CM | POA: Diagnosis present

## 2018-10-23 DIAGNOSIS — I8002 Phlebitis and thrombophlebitis of superficial vessels of left lower extremity: Secondary | ICD-10-CM | POA: Diagnosis not present

## 2018-10-23 DIAGNOSIS — D500 Iron deficiency anemia secondary to blood loss (chronic): Secondary | ICD-10-CM

## 2018-10-23 DIAGNOSIS — J9589 Other postprocedural complications and disorders of respiratory system, not elsewhere classified: Secondary | ICD-10-CM | POA: Diagnosis not present

## 2018-10-23 LAB — URINALYSIS, ROUTINE W REFLEX MICROSCOPIC
Bilirubin Urine: NEGATIVE
Glucose, UA: NEGATIVE mg/dL
Ketones, ur: NEGATIVE mg/dL
Nitrite: NEGATIVE
Protein, ur: NEGATIVE mg/dL
Specific Gravity, Urine: 1.017 (ref 1.005–1.030)
pH: 6 (ref 5.0–8.0)

## 2018-10-23 NOTE — MAU Note (Signed)
PT given fetal loss info because she did not receive post partum.

## 2018-10-23 NOTE — MAU Note (Signed)
Left calf 20.70 inches Right calf 21.10 inches

## 2018-10-23 NOTE — MAU Provider Note (Signed)
Chief Complaint:  Leg Swelling   First Provider Initiated Contact with Patient 10/23/18 0048      HPI: Heather Lynch is a 33 y.o. [id number] who is s/p 17 week miscarriage followed by surgical D&C of placenta under general anesthesia (on 10/20/2018), who presents to maternity admissions reporting mucous in her trachea, points to where bronchi branch off.  Gets a little mucous up with coughing.  Also c/o small area of tenderness on medial portion of left calf, just next to knee.  Worried it is a blood clot. . She reports vaginal bleeding, but no  vaginal itching/burning, urinary symptoms, h/a, dizziness, n/v, or fever/chills.     Past Medical History: Past Medical History:  Diagnosis Date  . Anemia   . Depression    refuses to take meds  . Gonorrhea   . Heart murmur   . Pneumonia   . Urinary tract infection     Past obstetric history: OB History  Gravida Para Term Preterm AB Living  SAB TAB Ectopic Multiple Live Births        0 7    # Outcome Date GA Lbr Len/2nd Weight Sex Delivery Anes PTL Lv  8 Para 10/20/18 [redacted]w[redacted]d / 00:01 150 g M Vag-Spont None  FD  7 Term 04/07/12 [redacted]w[redacted]d   F CS-LTranv Spinal  LIV  6 Term 2012    M CS-LTranv Spinal  LIV  5 Term 2009    F CS-LTranv Spinal  LIV  4 Term 2007    F CS-LTranv Spinal  LIV  3 Preterm 2006 [redacted]w[redacted]d   M Vag-Spont EPI Y LIV  2 Term 2004    M CS-LTranv Spinal  LIV  1 Term         LIV    Past Surgical History: Past Surgical History:  Procedure Laterality Date  . CESAREAN SECTION     X 5  . DILATION AND CURETTAGE OF UTERUS N/A 10/20/2018   Procedure: DILATATION AND CURETTAGE;  Surgeon: Adam Phenix, MD;  Location: [facility];  Service: Gynecology;  Laterality: N/A;    Family History: Family History  Problem Relation Age of Onset  . Hypertension Other   . Other Neg Hx     Social History: Social History   Tobacco Use  . Smoking status: Former Smoker    Last attempt to quit: 12/30/2011    Years since quitting: 6.8   . Smokeless tobacco: Never Used  Substance Use Topics  . Alcohol use: Not Currently    Comment: occasional  . Drug use: Not Currently    Types: Marijuana    Allergies: No Known Allergies  Meds:  No medications prior to admission.    I have reviewed patient's Past Medical Hx, Surgical Hx, Family Hx, Social Hx, medications and allergies.  ROS:  Review of Systems  Constitutional: Negative for chills and fever.  Respiratory: Negative for cough, chest tightness, shortness of breath and wheezing.        Feels mucous rattling in trachea   Cardiovascular: Positive for leg swelling.  Gastrointestinal: Negative for abdominal pain, constipation, diarrhea and nausea.  Genitourinary: Positive for vaginal bleeding.   Other systems negative     Physical Exam   Patient Vitals for the past 24 hrs:  BP Temp Pulse Resp SpO2 Weight  10/23/18 0130 137/78 - 95 - - -  10/23/18 0122 136/68 - - - - -  10/23/18 0035 136/68 - 92 - - -  10/22/18 2331 107/70 - (!) 102 - - -  10/22/18 2324 - - - - 99 % -  10/22/18 2301 139/79 98.8 F (37.1 C) (!) 106 12 100 % (!) 155.2 kg   Constitutional: Well-developed, well-nourished female in no acute distress.  Cardiovascular: normal rate and rhythm, no ectopy audible, S1 & S2 heard, no murmur  Respiratory: normal effort, no distress. Lungs CTAB with no wheezes or crackles  No adventitious sounds in trachea  GI: Abd soft, non-tender.  Nondistended.  No rebound, No guarding.  Bowel Sounds audible   MS: Extremities nontender, no edema, normal ROM   There is a small area of swelling about 5cm on left calf, near knee, medially.  Not indurated.  Area is slightly tender to palpation.  Calves are only 1cm different in size.  Negative Homans sign bilaterally     Neurologic: Alert and oriented x 4.   Grossly nonfocal. GU: Neg CVAT. Skin:  Warm and Dry Psych:  Affect appropriate.  PELVIC EXAM: Lochia scant, pelvic exam deferred   Labs: Results for orders  placed or performed during the hospital encounter of 10/22/18 (from the past 24 hour(s))  Urinalysis, Routine w reflex microscopic     Status: Abnormal   Collection Time: 10/22/18 11:34 PM  Result Value Ref Range   Color, Urine YELLOW YELLOW   APPearance CLEAR CLEAR   Specific Gravity, Urine 1.017 1.005 - 1.030   pH 6.0 5.0 - 8.0   Glucose, UA NEGATIVE NEGATIVE mg/dL   Hgb urine dipstick MODERATE (A) NEGATIVE   Bilirubin Urine NEGATIVE NEGATIVE   Ketones, ur NEGATIVE NEGATIVE mg/dL   Protein, ur NEGATIVE NEGATIVE mg/dL   Nitrite NEGATIVE NEGATIVE   Leukocytes,Ua SMALL (A) NEGATIVE   RBC / HPF 21-50 0 - 5 RBC/hpf   WBC, UA 0-5 0 - 5 WBC/hpf   Bacteria, UA RARE (A) NONE SEEN   Squamous Epithelial / LPF 0-5 0 - 5   Mucus PRESENT    --/--/O POS (06/02 0404)  Imaging:    MAU Course/MDM: I have ordered labs as follows:UA Imaging ordered: none Results reviewed.   Discussed mucous in airway is not unusual after General anesthesia.  Discussed and demonstrated (with return demo) how to do deep breathing, pursed lip breathing, and deep cough.   Discussed I think her leg swelling is a little mild superficial phlebitis.  Neg exam for DVT.  Discussed warm packs and good hydration to decrease swelling.   Discussed anemia resulting from blood loss  Recommend good hydration and iron supplements   Also discussed not over doing activity as her energy reserve will be compromised for a few weeks   Pt did not receive perinatal loss materials in Labor and Delivery, so given them here.     Pt stable at time of discharge.  Assessment: 1. Respiratory complication, postoperative   2. Thrombophlebitis of superficial veins of left lower extremity   3. Anemia, blood loss     Plan: Discharge home See above discussions for recommendations  Follow-up Information    [facility].   Specialty:  Obstetrics and Gynecology Contact information: [id number] [place] [phone]        Followup in office as scheduled Notify us if gets dizzy or lightheaded, may need transfusion  Encouraged to return here or to other Urgent Care/ED if she develops worsening of symptoms, increase in pain, fever, or other concerning symptoms.  Wynelle Bourgeois CNM, MSN Certified Nurse-Midwife 10/23/2018 9:41 AM

## 2018-10-23 NOTE — Discharge Instructions (Signed)
Fatigue If you have fatigue, you feel tired all the time and have a lack of energy or a lack of motivation. Fatigue may make it difficult to start or complete tasks because of exhaustion. In general, occasional or mild fatigue is often a normal response to activity or life. However, long-lasting (chronic) or extreme fatigue may be a symptom of a medical condition. Follow these instructions at home: General instructions  Watch your fatigue for any changes.  Go to bed and get up at the same time every day.  Avoid fatigue by pacing yourself during the day and getting enough sleep at night.  Maintain a healthy weight. Medicines  Take over-the-counter and prescription medicines only as told by your health care provider.  Take a multivitamin, if told by your health care provider.  Do not use herbal or dietary supplements unless they are approved by your health care provider. Activity   Exercise regularly, as told by your health care provider.  Use or practice techniques to help you relax, such as yoga, tai chi, meditation, or massage therapy. Eating and drinking   Avoid heavy meals in the evening.  Eat a well-balanced diet, which includes lean proteins, whole grains, plenty of fruits and vegetables, and low-fat dairy products.  Avoid consuming too much caffeine.  Avoid the use of alcohol.  Drink enough fluid to keep your urine pale yellow. Lifestyle  Change situations that cause you stress. Try to keep your work and personal schedule in balance.  Do not use any products that contain nicotine or tobacco, such as cigarettes and e-cigarettes. If you need help quitting, ask your health care provider.  Do not use drugs. Contact a health care provider if:  Your fatigue does not get better.  You have a fever.  You suddenly lose or gain weight.  You have headaches.  You have trouble falling asleep or sleeping through the night.  You feel angry, guilty, anxious, or  sad.  You are unable to have a bowel movement (constipation).  Your skin is dry.  You have swelling in your legs or another part of your body. Get help right away if:  You feel confused.  Your vision is blurry.  You feel faint or you pass out.  You have a severe headache.  You have severe pain in your abdomen, your back, or the area between your waist and hips (pelvis).  You have chest pain, shortness of breath, or an irregular or fast heartbeat.  You are unable to urinate, or you urinate less than normal.  You have abnormal bleeding, such as bleeding from the rectum, vagina, nose, lungs, or nipples.  You vomit blood.  You have thoughts about hurting yourself or others. If you ever feel like you may hurt yourself or others, or have thoughts about taking your own life, get help right away. You can go to your nearest emergency department or call:  Your local emergency services (911 in the [place]  A suicide crisis helpline, such as the [facility] at 434-801-2361. This is open 24 hours a day. Summary  If you have fatigue, you feel tired all the time and have a lack of energy or a lack of motivation.  Fatigue may make it difficult to start or complete tasks because of exhaustion.  Long-lasting (chronic) or extreme fatigue may be a symptom of a medical condition.  Exercise regularly, as told by your health care provider.  Change situations that cause you stress. Try to keep your  work and personal schedule in balance. This information is not intended to replace advice given to you by your health care provider. Make sure you discuss any questions you have with your health care provider. Document Released: 03/03/2007 Document Revised: 01/29/2017 Document Reviewed: 01/29/2017 Elsevier Interactive Patient Education  2019 [employer]. Thrombophlebitis Thrombophlebitis is a condition in which a blood clot forms in a vein. This can happen in your arms  or legs, or in the area between your neck and groin (torso). When this condition happens in a vein that is close to the surface of the body (superficial thrombophlebitis), it is usually not serious.However, when the condition happens in a vein that is deep inside the body (deep vein thrombosis, DVT), it can cause serious problems. What are the causes? This condition may be caused by:  Damage to a vein.  Inflammation of the veins.  A condition that causes blood to clot more easily.  Reduced blood flow through the veins. What increases the risk? The following factors may make you more likely to develop this condition:  Having a condition that makes blood thicker or more likely to clot.  Having an infection.  Having major surgery.  Experiencing a traumatic injury or a broken bone.  Having a catheter in a vein (central line).  Having a condition in which valves in the veins do not work properly, causing blood to collect (pool) in the veins (chronic venous insufficiency).  An inactive (sedentary) lifestyle.  Pregnancy or having recently given birth.  Cancer.  Older age, especially being 20 or older.  Obesity.  Smoking.  Taking medicines that contain estrogen, such as birth control pills.  Having varicose veins.  Using drugs that are injected into the veins (intravenous, IV). What are the signs or symptoms? The main symptoms of this condition are:  Swelling and pain in an arm or leg. If the affected vein is in the leg, you may feel pain while standing or walking.  Warmth or redness in an arm or leg. Other symptoms include:  Low-grade fever.  Muscle aches.  A bulging vein (venous distension). In some cases, there are no symptoms. How is this diagnosed? This condition may be diagnosed based on:  Your symptoms and medical history.  A physical exam.  Tests, such as: ? Blood tests. ? A test that uses sound waves to make images (ultrasound). How is this  treated? Treatment depends on how severe the condition is and which area of the body is affected. Treatment may include:  Applying a warm compress or heating pad to affected areas.  Wearing compression stockings to help prevent blood clots and reduce swelling in your legs.  Raising (elevating) the affected arm or leg above the level of your heart.  Medicines, such as: ? Anti-inflammatory medicines, such as ibuprofen. ? Blood thinners (anticoagulants), such as heparin. ? Antibiotic medicine, if you have an infection.  Removing an IV that may be causing the problem. In rare cases, surgery may be needed to:  Remove a damaged section of a vein.  Place a filter in a large vein to catch blood clots before they reach the lungs. Follow these instructions at home: Medicines  Take over-the-counter and prescription medicines only as told by your health care provider.  If you were prescribed an antibiotic, take it as told by your health care provider. Do not stop using the antibiotic even if you feel better. Managing pain, stiffness, and swelling   If directed, put heat on the affected  area as often as told by your health care provider. Use the heat source that your health care provider recommends, such as a moist heat pack or a heating pad. ? Place a towel between your skin and the heat source. ? Leave the heat on for 20-30 minutes. ? Remove the heat if your skin turns bright red. This is especially important if you are not able to feel pain, heat, or cold. You may have a greater risk of getting burned.  Elevate the affected area above the level of your heart while you are sitting or lying down. Activity  Return to your normal activities as told by your health care provider. Ask your health care provider what activities are safe for you.  Avoid sitting or lying down for long periods. If possible, stand up and walk around regularly. If you are taking blood thinners:  Take your medicine  exactly as told, at the same time every day.  Avoid activities that could cause injury or bruising, and follow instructions about how to prevent falls.  Wear a medical alert bracelet or carry a card that lists what medicines you take. General instructions  Drink enough fluid to keep your urine pale yellow.  Wear compression stockings as told by your health care provider.  Do not use any products that contain nicotine or tobacco, such as cigarettes and e-cigarettes. If you need help quitting, ask your health care provider.  Keep all follow-up visits as told by your health care provider. This is important. Contact a health care provider if:  You miss a dose of your blood thinner, if applicable.  Your symptoms do not improve.  You have unusual bruising.  You have nausea, vomiting, or diarrhea that lasts for more than one day. Get help right away if:  You have any of these problems: ? New or worse pain, swelling, or redness in an arm or leg. ? Numbness or tingling in an arm or leg. ? Shortness of breath. ? Chest pain. ? Severe pain in your abdomen. ? Fast breathing. ? A fast or irregular heartbeat. ? Blood in your vomit, stool, or urine. ? A severe headache or confusion. ? A cut that does not stop bleeding.  You feel light-headed or dizzy.  You cough up blood.  You have a serious fall or accident, or you hit your head. These symptoms may represent a serious problem that is an emergency. Do not wait to see if the symptoms will go away. Get medical help right away. Call your local emergency services (911 in the [place] Do not drive yourself to the hospital. Summary  Thrombophlebitis is a condition in which a blood clot forms in a vein. This can happen in a vein close to the surface of the body or a vein deep inside the body.  This condition can cause serious problems when it happens in a vein deep inside the body (deep vein thrombosis, DVT).  The main symptom of this  condition is swelling and pain around the affected vein.  Treatment may include warm compresses, anti-inflammatory medicines, or blood thinners. This information is not intended to replace advice given to you by your health care provider. Make sure you discuss any questions you have with your health care provider. Document Released: 10/30/2016 Document Revised: 10/30/2016 Document Reviewed: 10/30/2016 Elsevier Interactive Patient Education  [employer].

## 2018-11-03 ENCOUNTER — Encounter (HOSPITAL_COMMUNITY): Payer: Self-pay

## 2018-11-03 ENCOUNTER — Other Ambulatory Visit: Payer: Self-pay

## 2018-11-03 ENCOUNTER — Inpatient Hospital Stay (HOSPITAL_COMMUNITY)
Admission: AD | Admit: 2018-11-03 | Discharge: 2018-11-03 | Disposition: A | Discharge status: Home / Self Care | Payer: Medicaid Other | Attending: Obstetrics and Gynecology | Admitting: Obstetrics and Gynecology

## 2018-11-03 DIAGNOSIS — Z87891 Personal history of nicotine dependence: Secondary | ICD-10-CM | POA: Diagnosis not present

## 2018-11-03 DIAGNOSIS — R109 Unspecified abdominal pain: Secondary | ICD-10-CM | POA: Insufficient documentation

## 2018-11-03 DIAGNOSIS — R3915 Urgency of urination: Secondary | ICD-10-CM | POA: Diagnosis not present

## 2018-11-03 DIAGNOSIS — R300 Dysuria: Secondary | ICD-10-CM

## 2018-11-03 DIAGNOSIS — Z3A17 17 weeks gestation of pregnancy: Secondary | ICD-10-CM

## 2018-11-03 DIAGNOSIS — O26892 Other specified pregnancy related conditions, second trimester: Secondary | ICD-10-CM

## 2018-11-03 LAB — URINALYSIS, ROUTINE W REFLEX MICROSCOPIC
Bilirubin Urine: NEGATIVE
Glucose, UA: NEGATIVE mg/dL
Hgb urine dipstick: NEGATIVE
Ketones, ur: NEGATIVE mg/dL
Leukocytes,Ua: NEGATIVE
Nitrite: NEGATIVE
Protein, ur: NEGATIVE mg/dL
Specific Gravity, Urine: 1.014 (ref 1.005–1.030)
pH: 8 (ref 5.0–8.0)

## 2018-11-03 LAB — WET PREP, GENITAL
Clue Cells Wet Prep HPF POC: NONE SEEN
Sperm: NONE SEEN
Trich, Wet Prep: NONE SEEN
Yeast Wet Prep HPF POC: NONE SEEN

## 2018-11-03 NOTE — MAU Note (Signed)
Pt states when she goes to the bathroom to pee only a little bit of urine comes out. She states sometimes it is normal amount.   She states the urine smell is foul.   She reports that the urine has been cloudy.

## 2018-11-03 NOTE — MAU Provider Note (Signed)
History     CSN: [id number]  Arrival date and time: 11/03/18 1638   First Provider Initiated Contact with Patient 11/03/18 1719      Chief Complaint  Patient presents with  . Urinary Tract Infection   HPI Heather Lynch is a 33 y.o. [id number] at [redacted]w[redacted]d who presents to MAU with chief complaint of suprapubic cramping, urinary urgency and foul-smelling urine.  These are new problems, onset two days ago.  She is s/p miscarriage and D&E for retained products of conception on 10/20/18. She states she was advised to stay home and rest until her follow-up appointment. She reports that she does not feel the urge to void until she is up walking around. She is unsure how often she voids and is unfamiliar with the idea of a toileting schedule.  She primary drinks citrus tea and estimates she drinks " about three" 8 ounce bottles of water each day.  OB History    Gravida  8   Para  8   Term  6   Preterm  1   AB      Living  7     SAB      TAB      Ectopic      Multiple  0   Live Births  7           Past Medical History:  Diagnosis Date  . Anemia   . Depression    refuses to take meds  . Gonorrhea   . Heart murmur   . Pneumonia   . Urinary tract infection     Past Surgical History:  Procedure Laterality Date  . CESAREAN SECTION     X 5  . DILATION AND CURETTAGE OF UTERUS N/A 10/20/2018   Procedure: DILATATION AND CURETTAGE;  Surgeon: Woodroe Mode, MD;  Location: [facility];  Service: Gynecology;  Laterality: N/A;    Family History  Problem Relation Age of Onset  . Hypertension Other   . Other Neg Hx     Social History   Tobacco Use  . Smoking status: Former Smoker    Quit date: 12/30/2011    Years since quitting: 6.8  . Smokeless tobacco: Never Used  Substance Use Topics  . Alcohol use: Yes    Comment: occasional  . Drug use: Yes    Types: Marijuana    Allergies: No Known Allergies  No medications prior to admission.    Review of Systems   Constitutional: Negative for fever.  Respiratory: Negative for shortness of breath.   Gastrointestinal: Negative for abdominal pain.  Genitourinary: Positive for dysuria. Negative for decreased urine volume, difficulty urinating, flank pain, urgency, vaginal bleeding, vaginal discharge and vaginal pain.  Musculoskeletal: Negative for back pain.  All other systems reviewed and are negative.  Physical Exam   Blood pressure 130/81, pulse 77, temperature 98.7 F (37.1 C), resp. rate 12, weight (!) 148 kg, last menstrual period 12/23/2017, SpO2 99 %, currently breastfeeding.  Physical Exam  Nursing note and vitals reviewed. Constitutional: She is oriented to person, place, and time. She appears well-developed and well-nourished.  Cardiovascular: Normal rate.  Respiratory: Effort normal.  GI: Soft. There is no abdominal tenderness. There is no rigidity, no rebound, no guarding and no CVA tenderness.  Genitourinary:    No vaginal discharge.   Neurological: She is alert and oriented to person, place, and time.  Skin: Skin is warm and dry.  Psychiatric: She has a normal mood and affect. Her  behavior is normal. Judgment and thought content normal.    MAU Course/MDM  Procedures  --Labs collected via blind swab  Patient Vitals for the past 24 hrs:  BP Temp Pulse Resp SpO2 Weight  11/03/18 1757 130/81 - 77 - - -  11/03/18 1718 121/70 - 74 - - -  11/03/18 1705 120/71 98.7 F (37.1 C) 78 12 99 % -  11/03/18 1655 - - - - - (!) 148 kg    Results for orders placed or performed during the hospital encounter of 11/03/18 (from the past 24 hour(s))  Urinalysis, Routine w reflex microscopic     Status: Abnormal   Collection Time: 11/03/18  4:57 PM  Result Value Ref Range   Color, Urine STRAW (A) YELLOW   APPearance CLEAR CLEAR   Specific Gravity, Urine 1.014 1.005 - 1.030   pH 8.0 5.0 - 8.0   Glucose, UA NEGATIVE NEGATIVE mg/dL   Hgb urine dipstick NEGATIVE NEGATIVE   Bilirubin Urine  NEGATIVE NEGATIVE   Ketones, ur NEGATIVE NEGATIVE mg/dL   Protein, ur NEGATIVE NEGATIVE mg/dL   Nitrite NEGATIVE NEGATIVE   Leukocytes,Ua NEGATIVE NEGATIVE  Wet prep, genital     Status: Abnormal   Collection Time: 11/03/18  5:50 PM  Result Value Ref Range   Yeast Wet Prep HPF POC NONE SEEN NONE SEEN   Trich, Wet Prep NONE SEEN NONE SEEN   Clue Cells Wet Prep HPF POC NONE SEEN NONE SEEN   WBC, Wet Prep HPF POC MANY (A) NONE SEEN   Sperm NONE SEEN    Assessment and Plan  --33 y.o. [id number] at 4361w3d  --No concerning findings on physical exam or labs results --Pt advised to set 2 hour timer on her phone to remind her to void --Prioritize PO hydration with water --Discharge home in stable condition, return to MAU for worsening acuity  F/U: Patient to scheduled [facility] around June 30  Calvert CantorSamantha C Manessa Buley, [place] 11/03/2018, 7:14 PM

## 2018-11-03 NOTE — Discharge Instructions (Signed)

## 2018-11-26 ENCOUNTER — Ambulatory Visit: Payer: Medicaid Other | Admitting: Obstetrics and Gynecology

## 2018-11-27 ENCOUNTER — Other Ambulatory Visit: Payer: Self-pay

## 2018-11-27 ENCOUNTER — Encounter: Payer: Self-pay | Admitting: Family

## 2018-11-27 ENCOUNTER — Ambulatory Visit (INDEPENDENT_AMBULATORY_CARE_PROVIDER_SITE_OTHER): Payer: Self-pay | Admitting: Family

## 2018-11-27 VITALS — BP 119/86 | HR 92 | Temp 98.7°F | Wt 321.4 lb

## 2018-11-27 DIAGNOSIS — R399 Unspecified symptoms and signs involving the genitourinary system: Secondary | ICD-10-CM

## 2018-11-27 DIAGNOSIS — D500 Iron deficiency anemia secondary to blood loss (chronic): Secondary | ICD-10-CM

## 2018-11-27 LAB — POCT URINALYSIS DIPSTICK
Glucose, UA: NEGATIVE
Leukocytes, UA: NEGATIVE
Nitrite, UA: NEGATIVE
Protein, UA: POSITIVE — AB
Spec Grav, UA: >=1.03 — AB (ref 1.010–1.025)
Urobilinogen, UA: 0.2 E.U./dL
pH, UA: 6 (ref 5.0–8.0)

## 2018-11-27 NOTE — Progress Notes (Signed)
Pt presents for a follow up from [facility]. Pt complains of having UTI symptoms.

## 2018-11-27 NOTE — Progress Notes (Signed)
History:  Ms. Heather Lynch is a 33 y.o. [id number] who presents to clinic today for follow-up after a 17 wk SAB/D&C.  Pt had a 17 wk suspected abruption resulting in pregnancy loss in June.  Complications included PP hemorrhage.  Pt urinary frequency reported as normal. Occasional "sting".  LMP on 11/23/18, lasted 4 days.  Denies dizziness, + fatigue.  Discontinued iron x 2 weeks due to constipation that colace did not resove.  Desires pregnancy, may consider delaying due to understanding potential risks and complications due to history of five csections.    The following portions of the patient's history were reviewed and updated as appropriate: allergies, current medications, family history, past medical history, social history, past surgical history and problem list.  Review of Systems:  Review of Systems  Constitutional: Positive for fatigue. Negative for chills and fever.  Gastrointestinal: Negative for abdominal pain, constipation and nausea.  Genitourinary: Positive for dysuria. Negative for decreased urine volume, difficulty urinating, flank pain, frequency, hematuria, menstrual problem, pelvic pain, urgency, vaginal bleeding and vaginal discharge.  Neurological: Negative for dizziness.       Objective:  Physical Exam BP 119/86   Pulse 92   Temp 98.7 F (37.1 C) (Oral)   Wt (!) 321 lb 6.4 oz (145.8 kg)   LMP 12/23/2017   BMI 48.87 kg/m  Physical Exam Constitutional:      General: She is not in acute distress.    Appearance: She is well-developed.  HENT:     Head: Normocephalic and atraumatic.  Neck:     Musculoskeletal: Normal range of motion and neck supple.     Thyroid: No thyromegaly.  Cardiovascular:     Rate and Rhythm: Normal rate and regular rhythm.     Heart sounds: Normal heart sounds.  Pulmonary:     Effort: Pulmonary effort is normal.     Breath sounds: Normal breath sounds.  Abdominal:     Tenderness: There is no abdominal tenderness.  Genitourinary:  Vagina: Normal.     Cervix: No cervical bleeding.     Uterus: Not tender.   Skin:    General: Skin is warm and dry.  Neurological:     Mental Status: She is alert and oriented to person, place, and time.      Labs and Imaging Results for orders placed or performed in visit on 11/27/18 (from the past 24 hour(s))  POCT Urinalysis Dipstick     Status: Abnormal   Collection Time: 11/27/18 11:14 AM  Result Value Ref Range   Color, UA amber    Clarity, UA clear    Glucose, UA Negative Negative   Bilirubin, UA small    Ketones, UA trace    Spec Grav, UA >=1.030 (A) 1.010 - 1.025   Blood, UA moderate    pH, UA 6.0 5.0 - 8.0   Protein, UA Positive (A) Negative   Urobilinogen, UA 0.2 0.2 or 1.0 E.U./dL   Nitrite, UA negative    Leukocytes, UA Negative Negative   Appearance     Odor      No results found.   Assessment & Plan:  1.Post-op Follow-up - Return to work next week 12/07/18 as intended  - Plans to schedule well woman exam  2.  Dysuria - POCT Urinalysis Dipstick  3. Anemia, blood loss - Repeat CBC   Cyndee Brightly, CNM 11/27/2018 12:01 PM

## 2018-11-28 LAB — CBC
Hematocrit: 36.5 % (ref 34.0–46.6)
Hemoglobin: 10.9 g/dL — ABNORMAL LOW (ref 11.1–15.9)
MCH: 29 pg (ref 26.6–33.0)
MCHC: 29.9 g/dL — ABNORMAL LOW (ref 31.5–35.7)
MCV: 97 fL (ref 79–97)
Platelets: 461 10*3/uL — ABNORMAL HIGH (ref 150–450)
RBC: 3.76 x10E6/uL — ABNORMAL LOW (ref 3.77–5.28)
RDW: 14.9 % (ref 11.7–15.4)
WBC: 9.6 10*3/uL (ref 3.4–10.8)

## 2018-12-01 ENCOUNTER — Telehealth: Payer: Self-pay | Admitting: Family

## 2018-12-01 MED ORDER — INTEGRA F 125-1 MG PO CAPS: 1.0000 | ORAL_CAPSULE | Freq: Every day | ORAL | 1 refills | Status: DC

## 2018-12-01 NOTE — Telephone Encounter (Signed)
Pt called to notify of improving hgb, no answer.  Message routed to RN to notify patient.

## 2018-12-01 NOTE — Addendum Note (Signed)
Addended by: Cyndee Brightly on: 12/01/2018 03:05 PM   Modules accepted: Orders

## 2018-12-02 NOTE — Telephone Encounter (Signed)
Spoke with patient regarding Hgb level. Per YOVZCHY, CNM, hgb level is improving. Iron level was 6.6 increased to 10.9. Patient to continue iron supplement. Patient verbalized understanding.   Derl Barrow, RN

## 2018-12-24 ENCOUNTER — Encounter: Payer: Self-pay | Admitting: *Deleted

## 2018-12-24 ENCOUNTER — Ambulatory Visit: Payer: Medicaid Other | Admitting: Women's Health

## 2018-12-25 ENCOUNTER — Other Ambulatory Visit: Payer: Self-pay

## 2018-12-25 DIAGNOSIS — Z5321 Procedure and treatment not carried out due to patient leaving prior to being seen by health care provider: Secondary | ICD-10-CM | POA: Insufficient documentation

## 2018-12-25 DIAGNOSIS — R109 Unspecified abdominal pain: Secondary | ICD-10-CM | POA: Insufficient documentation

## 2018-12-25 NOTE — ED Notes (Signed)
Heather Lynch [phone], boyfriend.

## 2018-12-26 ENCOUNTER — Other Ambulatory Visit: Payer: Self-pay

## 2018-12-26 ENCOUNTER — Inpatient Hospital Stay (HOSPITAL_COMMUNITY)
Admission: AD | Admit: 2018-12-26 | Discharge: 2018-12-30 | DRG: 872 | Disposition: A | Discharge status: Home / Self Care | Payer: Medicaid Other | Attending: Internal Medicine | Admitting: Internal Medicine

## 2018-12-26 ENCOUNTER — Encounter (HOSPITAL_COMMUNITY): Payer: Self-pay | Admitting: Emergency Medicine

## 2018-12-26 ENCOUNTER — Encounter (HOSPITAL_COMMUNITY): Payer: Self-pay

## 2018-12-26 ENCOUNTER — Ambulatory Visit (HOSPITAL_COMMUNITY)
Admission: EM | Admit: 2018-12-26 | Discharge: 2018-12-26 | Disposition: A | Discharge status: Home / Self Care | Payer: Medicaid Other | Attending: Family Medicine | Admitting: Family Medicine

## 2018-12-26 ENCOUNTER — Emergency Department (HOSPITAL_COMMUNITY): Payer: Medicaid Other

## 2018-12-26 ENCOUNTER — Emergency Department (HOSPITAL_COMMUNITY)
Admission: EM | Admit: 2018-12-26 | Discharge: 2018-12-26 | Disposition: A | Discharge status: Home / Self Care | Payer: Medicaid Other | Source: Home / Self Care

## 2018-12-26 DIAGNOSIS — R000 Tachycardia, unspecified: Secondary | ICD-10-CM

## 2018-12-26 DIAGNOSIS — Z8759 Personal history of other complications of pregnancy, childbirth and the puerperium: Secondary | ICD-10-CM | POA: Diagnosis not present

## 2018-12-26 DIAGNOSIS — Z87440 Personal history of urinary (tract) infections: Secondary | ICD-10-CM

## 2018-12-26 DIAGNOSIS — Z98891 History of uterine scar from previous surgery: Secondary | ICD-10-CM | POA: Diagnosis not present

## 2018-12-26 DIAGNOSIS — A4151 Sepsis due to Escherichia coli [E. coli]: Secondary | ICD-10-CM | POA: Diagnosis not present

## 2018-12-26 DIAGNOSIS — Z87891 Personal history of nicotine dependence: Secondary | ICD-10-CM

## 2018-12-26 DIAGNOSIS — Z8619 Personal history of other infectious and parasitic diseases: Secondary | ICD-10-CM | POA: Diagnosis not present

## 2018-12-26 DIAGNOSIS — Z20828 Contact with and (suspected) exposure to other viral communicable diseases: Secondary | ICD-10-CM | POA: Diagnosis present

## 2018-12-26 DIAGNOSIS — N10 Acute tubulo-interstitial nephritis: Secondary | ICD-10-CM | POA: Diagnosis present

## 2018-12-26 DIAGNOSIS — R109 Unspecified abdominal pain: Secondary | ICD-10-CM

## 2018-12-26 DIAGNOSIS — E876 Hypokalemia: Secondary | ICD-10-CM | POA: Diagnosis present

## 2018-12-26 DIAGNOSIS — R1011 Right upper quadrant pain: Secondary | ICD-10-CM

## 2018-12-26 DIAGNOSIS — N390 Urinary tract infection, site not specified: Secondary | ICD-10-CM

## 2018-12-26 DIAGNOSIS — D72829 Elevated white blood cell count, unspecified: Secondary | ICD-10-CM | POA: Diagnosis present

## 2018-12-26 DIAGNOSIS — A4150 Gram-negative sepsis, unspecified: Secondary | ICD-10-CM

## 2018-12-26 DIAGNOSIS — R509 Fever, unspecified: Secondary | ICD-10-CM

## 2018-12-26 DIAGNOSIS — N12 Tubulo-interstitial nephritis, not specified as acute or chronic: Secondary | ICD-10-CM

## 2018-12-26 LAB — POCT URINALYSIS DIP (DEVICE)
Bilirubin Urine: NEGATIVE
Glucose, UA: NEGATIVE mg/dL
Ketones, ur: NEGATIVE mg/dL
Nitrite: NEGATIVE
Protein, ur: 100 mg/dL — AB
Specific Gravity, Urine: 1.015 (ref 1.005–1.030)
Urobilinogen, UA: 0.2 mg/dL (ref 0.0–1.0)
pH: 6.5 (ref 5.0–8.0)

## 2018-12-26 LAB — URINALYSIS, ROUTINE W REFLEX MICROSCOPIC
Bilirubin Urine: NEGATIVE
Glucose, UA: NEGATIVE mg/dL
Ketones, ur: NEGATIVE mg/dL
Nitrite: NEGATIVE
Protein, ur: NEGATIVE mg/dL
Specific Gravity, Urine: 1.003 — ABNORMAL LOW (ref 1.005–1.030)
pH: 6 (ref 5.0–8.0)

## 2018-12-26 LAB — COMPREHENSIVE METABOLIC PANEL
ALT: 10 U/L (ref 0–44)
ALT: 12 U/L (ref 0–44)
AST: 17 U/L (ref 15–41)
AST: 25 U/L (ref 15–41)
Albumin: 3.7 g/dL (ref 3.5–5.0)
Albumin: 3.8 g/dL (ref 3.5–5.0)
Alkaline Phosphatase: 78 U/L (ref 38–126)
Alkaline Phosphatase: 73 U/L (ref 38–126)
Anion gap: 8 (ref 5–15)
Anion gap: 13 (ref 5–15)
BUN: <5 mg/dL — ABNORMAL LOW (ref 6–20)
BUN: 8 mg/dL (ref 6–20)
CO2: 25 mmol/L (ref 22–32)
CO2: 22 mmol/L (ref 22–32)
Calcium: 9 mg/dL (ref 8.9–10.3)
Calcium: 9.1 mg/dL (ref 8.9–10.3)
Chloride: 102 mmol/L (ref 98–111)
Chloride: 102 mmol/L (ref 98–111)
Creatinine, Ser: 0.8 mg/dL (ref 0.44–1.00)
Creatinine, Ser: 0.84 mg/dL (ref 0.44–1.00)
GFR calc Af Amer: >60 mL/min (ref >60)
GFR calc Af Amer: >60 mL/min (ref >60)
GFR calc non Af Amer: >60 mL/min (ref >60)
GFR calc non Af Amer: >60 mL/min (ref >60)
Glucose, Bld: 112 mg/dL — ABNORMAL HIGH (ref 70–99)
Glucose, Bld: 90 mg/dL (ref 70–99)
Potassium: 4.1 mmol/L (ref 3.5–5.1)
Potassium: 3.5 mmol/L (ref 3.5–5.1)
Sodium: 135 mmol/L (ref 135–145)
Sodium: 137 mmol/L (ref 135–145)
Total Bilirubin: 0.1 mg/dL — ABNORMAL LOW (ref 0.3–1.2)
Total Bilirubin: 0.5 mg/dL (ref 0.3–1.2)
Total Protein: 7.5 g/dL (ref 6.5–8.1)
Total Protein: 7.4 g/dL (ref 6.5–8.1)

## 2018-12-26 LAB — CBC WITH DIFFERENTIAL/PLATELET
Abs Immature Granulocytes: 0.07 10*3/uL (ref 0.00–0.07)
Basophils Absolute: 0 10*3/uL (ref 0.0–0.1)
Basophils Relative: 0 %
Eosinophils Absolute: 0.1 10*3/uL (ref 0.0–0.5)
Eosinophils Relative: 1 %
HCT: 37.7 % (ref 36.0–46.0)
Hemoglobin: 11.2 g/dL — ABNORMAL LOW (ref 12.0–15.0)
Immature Granulocytes: 0 %
Lymphocytes Relative: 17 %
Lymphs Abs: 3.1 10*3/uL (ref 0.7–4.0)
MCH: 27.3 pg (ref 26.0–34.0)
MCHC: 29.7 g/dL — ABNORMAL LOW (ref 30.0–36.0)
MCV: 92 fL (ref 80.0–100.0)
Monocytes Absolute: 1.2 10*3/uL — ABNORMAL HIGH (ref 0.1–1.0)
Monocytes Relative: 6 %
Neutro Abs: 13.9 10*3/uL — ABNORMAL HIGH (ref 1.7–7.7)
Neutrophils Relative %: 76 %
Platelets: 415 10*3/uL — ABNORMAL HIGH (ref 150–400)
RBC: 4.1 MIL/uL (ref 3.87–5.11)
RDW: 15.3 % (ref 11.5–15.5)
WBC: 18.4 10*3/uL — ABNORMAL HIGH (ref 4.0–10.5)
nRBC: 0 % (ref 0.0–0.2)

## 2018-12-26 LAB — CBC
HCT: 35 % — ABNORMAL LOW (ref 36.0–46.0)
Hemoglobin: 10.6 g/dL — ABNORMAL LOW (ref 12.0–15.0)
MCH: 27 pg (ref 26.0–34.0)
MCHC: 30.3 g/dL (ref 30.0–36.0)
MCV: 89.1 fL (ref 80.0–100.0)
Platelets: 467 10*3/uL — ABNORMAL HIGH (ref 150–400)
RBC: 3.93 MIL/uL (ref 3.87–5.11)
RDW: 14.8 % (ref 11.5–15.5)
WBC: 12.8 10*3/uL — ABNORMAL HIGH (ref 4.0–10.5)
nRBC: 0 % (ref 0.0–0.2)

## 2018-12-26 LAB — LIPASE, BLOOD
Lipase: 23 U/L (ref 11–51)
Lipase: 19 U/L (ref 11–51)

## 2018-12-26 LAB — SARS CORONAVIRUS 2 BY RT PCR (HOSPITAL ORDER, PERFORMED IN ~~LOC~~ HOSPITAL LAB): SARS Coronavirus 2: NEGATIVE

## 2018-12-26 LAB — WET PREP, GENITAL
Clue Cells Wet Prep HPF POC: NONE SEEN
Sperm: NONE SEEN
Trich, Wet Prep: NONE SEEN
Yeast Wet Prep HPF POC: NONE SEEN

## 2018-12-26 LAB — I-STAT BETA HCG BLOOD, ED (MC, WL, AP ONLY): I-stat hCG, quantitative: <5 m[IU]/mL (ref <5)

## 2018-12-26 LAB — HCG, SERUM, QUALITATIVE: Preg, Serum: NEGATIVE

## 2018-12-26 LAB — POCT PREGNANCY, URINE: Preg Test, Ur: POSITIVE — AB

## 2018-12-26 LAB — LACTIC ACID, PLASMA: Lactic Acid, Venous: 2.2 mmol/L (ref 0.5–1.9)

## 2018-12-26 MED ORDER — SODIUM CHLORIDE 0.9% FLUSH: 3.0000 mL | Freq: Once | INTRAVENOUS | Status: DC

## 2018-12-26 MED ORDER — ACETAMINOPHEN 325 MG PO TABS
ORAL_TABLET | ORAL | Status: AC
  Filled 2018-12-26: qty 2

## 2018-12-26 MED ORDER — ACETAMINOPHEN 325 MG PO TABS
650.0000 mg | ORAL_TABLET | Freq: Once | ORAL | Status: AC
  Administered 2018-12-26: 650 mg via ORAL

## 2018-12-26 MED ORDER — KETOROLAC TROMETHAMINE 15 MG/ML IJ SOLN
15.0000 mg | Freq: Once | INTRAMUSCULAR | Status: AC
  Administered 2018-12-26: 15 mg via INTRAVENOUS
  Filled 2018-12-26: qty 1

## 2018-12-26 MED ORDER — ACETAMINOPHEN 325 MG PO TABS
650.0000 mg | ORAL_TABLET | Freq: Once | ORAL | Status: AC
  Administered 2018-12-26: 650 mg via ORAL
  Filled 2018-12-26: qty 2

## 2018-12-26 MED ORDER — SODIUM CHLORIDE 0.9 % IV SOLN
1.0000 g | Freq: Once | INTRAVENOUS | Status: AC
  Administered 2018-12-26: 1 g via INTRAVENOUS
  Filled 2018-12-26: qty 10

## 2018-12-26 MED ORDER — SODIUM CHLORIDE 0.9 % IV BOLUS
1000.0000 mL | Freq: Once | INTRAVENOUS | Status: AC
  Administered 2018-12-26: 18:00:00 1000 mL via INTRAVENOUS

## 2018-12-26 NOTE — MAU Note (Signed)
Report given to Jamie RN in [facility] 

## 2018-12-26 NOTE — ED Notes (Signed)
Pt stated she is leaving. 

## 2018-12-26 NOTE — ED Notes (Signed)
Patient verbalizes understanding of discharge instructions. Opportunity for questioning and answers were provided. Patient discharged from UCC by provider.  

## 2018-12-26 NOTE — ED Provider Notes (Signed)
[facility] Provider Note   CSN: [id number] Arrival date & time: 12/26/18  1330     History   Chief Complaint Chief Complaint  Patient presents with  . Flank Pain    HPI Heather Lynch is a 33 y.o. female who presents with fever and flank pain.  Past medical history significant for UTI, gonorrhea.  Patient states that since yesterday she has developed left-sided flank pain.  She initially thought it was "gas pain" but then has worsened and now feels like a soreness.  The pain radiates across her abdomen to the right side.  She reports associated fever and chills with nausea and vomiting at home.  She recently came to the ED last night but left without being seen due to long wait times.  She continued to feel bad today and was sent to women's because of the positive urine pregnancy test.  She states that she did have a miscarriage at 19 weeks in June and had a D&C.  She has had 2 periods since then and her LMP was 1 week ago.  She also reports having urinary symptoms of dysuria and malodorous urine with urgency.  No hematuria.  She is been having some vaginal discharge as well but denies any pelvic pain.  Past surgical history significant for 5 C-sections.  She denies headache, URI symptoms, chest pain, shortness of breath, cough, diarrhea. She did have a head cold last week that she got from her kids but that has gone away.    HPI  Past Medical History:  Diagnosis Date  . Anemia   . Depression    refuses to take meds  . Gonorrhea   . Heart murmur   . Pneumonia   . Preterm delivery 10/20/2018  . Spontaneous abortion 10/20/2018  . Urinary tract infection     There are no active problems to display for this patient.   Past Surgical History:  Procedure Laterality Date  . CESAREAN SECTION     X 5  . DILATION AND CURETTAGE OF UTERUS N/A 10/20/2018   Procedure: DILATATION AND CURETTAGE;  Surgeon: Adam PhenixArnold, James G, MD;  Location: [facility] LD ORS;   Service: Gynecology;  Laterality: N/A;     OB History    Gravida  8   Para  8   Term  6   Preterm  1   AB      Living  7     SAB      TAB      Ectopic      Multiple  0   Live Births  7            Home Medications    Prior to Admission medications   Medication Sig Start Date End Date Taking? Authorizing Provider  docusate sodium (COLACE) 100 MG capsule Take 1 capsule (100 mg total) by mouth 2 (two) times daily. Patient not taking: Reported on 11/27/2018 10/21/18   Constant, Peggy, MD  Fe Fum-FePoly-FA-Vit C-Vit B3 (INTEGRA F) 125-1 MG CAPS Take 1 capsule by mouth daily. 12/01/18   Karim-Rhoades, Kae HellerWalidah N, CNM  ferrous sulfate (FERROUSUL) 325 (65 FE) MG tablet Take 1 tablet (325 mg total) by mouth 2 (two) times daily. Patient not taking: Reported on 11/27/2018 10/21/18   Constant, Peggy, MD  ibuprofen (ADVIL) 600 MG tablet Take 1 tablet (600 mg total) by mouth every 6 (six) hours as needed. Patient not taking: Reported on 11/27/2018 10/21/18   Constant, Gigi GinPeggy, MD  Prenatal Vit-Fe Fumarate-FA (PRENATAL MULTIVITAMIN) TABS tablet Take 1 tablet by mouth daily at 12 noon.    [provider]    Family History Family History  Problem Relation Age of Onset  . Hypertension Other   . Other Neg Hx     Social History Social History   Tobacco Use  . Smoking status: Former Smoker    Quit date: 12/30/2011    Years since quitting: 6.9  . Smokeless tobacco: Never Used  Substance Use Topics  . Alcohol use: Yes    Comment: occasional  . Drug use: Yes    Types: Marijuana    Comment: occassional     Allergies   Patient has no known allergies.   Review of Systems Review of Systems  Constitutional: Positive for chills and fever.  Respiratory: Negative for cough.   Cardiovascular: Negative for chest pain.  Gastrointestinal: Positive for abdominal pain, nausea and vomiting. Negative for diarrhea.  Genitourinary: Positive for dysuria, flank pain, frequency and  vaginal discharge. Negative for difficulty urinating, hematuria, pelvic pain and vaginal bleeding.  All other systems reviewed and are negative.    Physical Exam Updated Vital Signs BP 128/76   Pulse (!) 109   Temp 98.9 F (37.2 C) (Oral)   Resp 18   LMP 12/21/2018   SpO2 100%   Physical Exam Vitals signs and nursing note reviewed.  Constitutional:      General: She is not in acute distress.    Appearance: She is well-developed. She is obese. She is not ill-appearing.  HENT:     Head: Normocephalic and atraumatic.  Eyes:     General: No scleral icterus.       Right eye: No discharge.        Left eye: No discharge.     Conjunctiva/sclera: Conjunctivae normal.     Pupils: Pupils are equal, round, and reactive to light.  Neck:     Musculoskeletal: Normal range of motion.  Cardiovascular:     Rate and Rhythm: Tachycardia present.  Pulmonary:     Effort: Pulmonary effort is normal. No respiratory distress.     Breath sounds: Normal breath sounds.  Abdominal:     General: There is no distension.     Palpations: Abdomen is soft.     Tenderness: There is abdominal tenderness (left sided). There is left CVA tenderness. There is no right CVA tenderness.  Genitourinary:    Comments: Pelvic: No inguinal lymphadenopathy or inguinal hernia noted. Normal external genitalia. No pain with speculum insertion. Closed cervical os with normal appearance - no rash or lesions. No significant discharge or bleeding noted from cervix or in vaginal vault. On bimanual examination no adnexal tenderness or cervical motion tenderness. Chaperone present during exam.   Skin:    General: Skin is warm and dry.  Neurological:     Mental Status: She is alert and oriented to person, place, and time.  Psychiatric:        Behavior: Behavior normal.      ED Treatments / Results  Labs (all labs ordered are listed, but only abnormal results are displayed) Labs Reviewed  WET PREP, GENITAL - Abnormal;  Notable for the following components:      Result Value   WBC, Wet Prep HPF POC FEW (*)    All other components within normal limits  URINALYSIS, ROUTINE W REFLEX MICROSCOPIC - Abnormal; Notable for the following components:   Color, Urine STRAW (*)    Specific Gravity, Urine 1.003 (*)  Hgb urine dipstick SMALL (*)    Leukocytes,Ua MODERATE (*)    Bacteria, UA RARE (*)    All other components within normal limits  CBC WITH DIFFERENTIAL/PLATELET - Abnormal; Notable for the following components:   WBC 18.4 (*)    Hemoglobin 11.2 (*)    MCHC 29.7 (*)    Platelets 415 (*)    Neutro Abs 13.9 (*)    Monocytes Absolute 1.2 (*)    All other components within normal limits  URINE CULTURE  SARS CORONAVIRUS 2 (HOSPITAL ORDER, [place] [facility])  CULTURE, BLOOD (ROUTINE X 2)  CULTURE, BLOOD (ROUTINE X 2)  HCG, SERUM, QUALITATIVE  LIPASE, BLOOD  COMPREHENSIVE METABOLIC PANEL  LACTIC ACID, PLASMA  LACTIC ACID, PLASMA  GC/CHLAMYDIA PROBE AMP ([place]) NOT AT [facility]    EKG None  Radiology Dg Chest Port 1 View  Result Date: 12/26/2018 CLINICAL DATA:  Congestion and fevers EXAM: PORTABLE CHEST 1 VIEW COMPARISON:  11/01/2015 FINDINGS: The heart size and mediastinal contours are within normal limits. Both lungs are clear. The visualized skeletal structures are unremarkable. IMPRESSION: No active disease. Electronically Signed   By: Inez Catalina M.D.   On: 12/26/2018 22:48    Procedures Procedures (including critical care time)  Medications Ordered in ED Medications  sodium chloride flush (NS) 0.9 % injection 3 mL (3 mLs Intravenous Not Given 12/26/18 1839)  sodium chloride 0.9 % bolus 1,000 mL (0 mLs Intravenous Stopped 12/26/18 2038)  cefTRIAXone (ROCEPHIN) 1 g in sodium chloride 0.9 % 100 mL IVPB (0 g Intravenous Stopped 12/26/18 1946)  ketorolac (TORADOL) 15 MG/ML injection 15 mg (15 mg Intravenous Given 12/26/18 1950)  acetaminophen (TYLENOL) tablet 650 mg (650 mg Oral  Given 12/26/18 2214)     Initial Impression / Assessment and Plan / ED Course  I have reviewed the triage vital signs and the nursing notes.  Pertinent labs & imaging results that were available during my care of the patient were reviewed by me and considered in my medical decision making (see chart for details).  33 year old female presents with fever, chills, L flank pain, urinary symptoms. She is febrile with Tmax of 102.6 here and initially significantly tachycardic. She has not been hypotensive. On exam her lungs are CTA. Abdomen is tender over the L side and she has significant L flank tenderness. Pelvic is unremarkable. CBC is remarkable for leukocytosis of 18.4 with mild anemia. CMP is normal. UA shows small hgb, moderate leukocytes, 11-20 WBC. Urine culture was sent. Wet prep is normal. She was given fluids and Rocephin. On re-eval she is not feeling better - will admit. Clinically presentation is consistent with pyelo and I do not think she needs a scan at this time. Discussed with Dr. Jonelle Sidle who will admit.  Final Clinical Impressions(s) / ED Diagnoses   Final diagnoses:  Pyelonephritis    ED Discharge Orders    None       Iris Pert 12/26/18 2308    Davonna Belling, MD 12/26/18 [phone]

## 2018-12-26 NOTE — MAU Provider Note (Addendum)
Ms. Heather Lynch is a 34 y.o. [phone] who presents to MAU today from [facility] for further evaluation of abdominal and flank pain, fever and N/V since yesterday. She presented to the [facility] last night for evaluation and left before being seen due to wait times. She states temperature today was 100.4 F at home and she took Tylenol. She continues to have abdominal pain and nausea. Even though her hCG in the ED last night was negative, [facility] did a UPT today and read the results as positive. I have performed a hCG qualitative today in MAU which is negative.   MSE started in MAU. The patient will be transferred to the [facility] for further evaluation of her other symptoms. Patient is agreeable with this plan.   Luvenia Redden, PA-C 12/26/2018 3:28 PM

## 2018-12-26 NOTE — ED Notes (Addendum)
ED TO INPATIENT HANDOFF REPORT  ED Nurse Name and Phone #: Jeannett SeniorStephen [phone]  [facility] Name/Age/Gender Heather Lynch 33 y.o. female Room/Bed: 002C/002C  Code Status   Code Status: Prior  Home/SNF/Other Home Patient oriented to: self, place, time and situation Is this baseline? Yes   Triage Complete: Triage complete  Chief Complaint Abd pain Fever  Triage Note Pt here for eval of tenderness to left flank, abdominal pain with fever and chills since last night. Some dysuria.    Allergies No Known Allergies  Level of Care/Admitting Diagnosis ED Disposition    ED Disposition Condition Comment   Admit  [facility] [[id number]]  Level of Care: Med-Surg [16]  Covid Evaluation: Asymptomatic Screening Protocol (No Symptoms)  Diagnosis: Acute pyelonephritis [454098][741568]  Admitting Physician: Rometta EmeryGARBA, MOHAMMAD L [[id number]]  Attending Physician: Rometta EmeryGARBA, MOHAMMAD L [[id number]]  Estimated length of stay: past midnight tomorrow  Certification:: I certify this patient will need inpatient services for at least 2 midnights  PT Class (Do Not Modify): Inpatient [101]  PT Acc Code (Do Not Modify): Private [1]       B Medical/Surgery History Past Medical History:  Diagnosis Date  . Anemia   . Depression    refuses to take meds  . Gonorrhea   . Heart murmur   . Pneumonia   . Preterm delivery 10/20/2018  . Spontaneous abortion 10/20/2018  . Urinary tract infection    Past Surgical History:  Procedure Laterality Date  . CESAREAN SECTION     X 5  . DILATION AND CURETTAGE OF UTERUS N/A 10/20/2018   Procedure: DILATATION AND CURETTAGE;  Surgeon: Adam PhenixArnold, James G, MD;  Location: [facility] LD ORS;  Service: Gynecology;  Laterality: N/A;     A IV Location/Drains/Wounds Patient Lines/Drains/Airways Status   Active Line/Drains/Airways    Name:   Placement date:   Placement time:   Site:   Days:   Peripheral IV 12/26/18 Left Hand   12/26/18    1826    Hand   less than 1   Airway 7  mm   10/20/18    0530     67   Incision (Closed) 10/20/18 Vagina   10/20/18    0646     67          Intake/Output Last 24 hours No intake or output data in the 24 hours ending 12/26/18 2322  Labs/Imaging Results for orders placed or performed during the hospital encounter of 12/26/18 (from the past 48 hour(s))  hCG, serum, qualitative     Status: None   Collection Time: 12/26/18  2:23 PM  Result Value Ref Range   Preg, Serum NEGATIVE NEGATIVE    Comment:        THE SENSITIVITY OF THIS METHODOLOGY IS >10 mIU/mL. Performed at [facility], [address] [place], [place] [address]   Urinalysis, Routine w reflex microscopic     Status: Abnormal   Collection Time: 12/26/18  2:41 PM  Result Value Ref Range   Color, Urine STRAW (A) YELLOW   APPearance CLEAR CLEAR   Specific Gravity, Urine 1.003 (L) 1.005 - 1.030   pH 6.0 5.0 - 8.0   Glucose, UA NEGATIVE NEGATIVE mg/dL   Hgb urine dipstick SMALL (A) NEGATIVE   Bilirubin Urine NEGATIVE NEGATIVE   Ketones, ur NEGATIVE NEGATIVE mg/dL   Protein, ur NEGATIVE NEGATIVE mg/dL   Nitrite NEGATIVE NEGATIVE   Leukocytes,Ua MODERATE (A) NEGATIVE   WBC, UA 11-20 0 -  5 WBC/hpf   Bacteria, UA RARE (A) NONE SEEN   Squamous Epithelial / LPF 0-5 0 - 5    Comment: Performed at [facility], [address] [place], [place] [address]  Lipase, blood     Status: None   Collection Time: 12/26/18  6:28 PM  Result Value Ref Range   Lipase 19 11 - 51 U/L    Comment: Performed at [facility], [address] [place], [place] [address]  Comprehensive metabolic panel     Status: None   Collection Time: 12/26/18  6:28 PM  Result Value Ref Range   Sodium 137 135 - 145 mmol/L   Potassium 3.5 3.5 - 5.1 mmol/L   Chloride 102 98 - 111 mmol/L   CO2 22 22 - 32 mmol/L   Glucose, Bld 90 70 - 99 mg/dL   BUN 8 6 - 20 mg/dL   Creatinine, Ser 0.980.84 0.44 - 1.00 mg/dL   Calcium 9.1 8.9 - 11.910.3 mg/dL   Total Protein 7.4 6.5 - 8.1 g/dL   Albumin  3.8 3.5 - 5.0 g/dL   AST 25 15 - 41 U/L   ALT 12 0 - 44 U/L   Alkaline Phosphatase 73 38 - 126 U/L   Total Bilirubin 0.5 0.3 - 1.2 mg/dL   GFR calc non Af Amer >60 >60 mL/min   GFR calc Af Amer >60 >60 mL/min   Anion gap 13 5 - 15    Comment: Performed at [facility], [address] [place], [place] [address]  CBC with Differential     Status: Abnormal   Collection Time: 12/26/18  6:28 PM  Result Value Ref Range   WBC 18.4 (H) 4.0 - 10.5 K/uL   RBC 4.10 3.87 - 5.11 MIL/uL   Hemoglobin 11.2 (L) 12.0 - 15.0 g/dL   HCT 95.637.7 21.336.0 - 08.646.0 %   MCV 92.0 80.0 - 100.0 fL   MCH 27.3 26.0 - 34.0 pg   MCHC 29.7 (L) 30.0 - 36.0 g/dL   RDW 57.815.3 46.911.5 - 62.915.5 %   Platelets 415 (H) 150 - 400 K/uL   nRBC 0.0 0.0 - 0.2 %   Neutrophils Relative % 76 %   Neutro Abs 13.9 (H) 1.7 - 7.7 K/uL   Lymphocytes Relative 17 %   Lymphs Abs 3.1 0.7 - 4.0 K/uL   Monocytes Relative 6 %   Monocytes Absolute 1.2 (H) 0.1 - 1.0 K/uL   Eosinophils Relative 1 %   Eosinophils Absolute 0.1 0.0 - 0.5 K/uL   Basophils Relative 0 %   Basophils Absolute 0.0 0.0 - 0.1 K/uL   Immature Granulocytes 0 %   Abs Immature Granulocytes 0.07 0.00 - 0.07 K/uL    Comment: Performed at [facility], [address] [place], [place] [address]  Wet prep, genital     Status: Abnormal   Collection Time: 12/26/18  7:23 PM   Specimen: Thin Prep Cervical/Endocervical  Result Value Ref Range   Yeast Wet Prep HPF POC NONE SEEN NONE SEEN   Trich, Wet Prep NONE SEEN NONE SEEN   Clue Cells Wet Prep HPF POC NONE SEEN NONE SEEN   WBC, Wet Prep HPF POC FEW (A) NONE SEEN   Sperm NONE SEEN     Comment: Performed at [facility], [address] [place], [place] [address]   Dg Chest Port 1 View  Result Date: 12/26/2018 CLINICAL DATA:  Congestion and fevers EXAM:  PORTABLE CHEST 1 VIEW COMPARISON:  11/01/2015 FINDINGS: The heart size and mediastinal contours are within normal limits. Both lungs are clear. The visualized skeletal  structures are unremarkable. IMPRESSION: No active disease. Electronically Signed   By: Inez Catalina M.D.   On: 12/26/2018 22:48    Pending Labs Unresulted Labs (From admission, onward)    Start     Ordered   12/26/18 2159  SARS Coronavirus 2 [facility] order, Performed in [facility] hospital lab) Nasopharyngeal Nasopharyngeal Swab  (Symptomatic/High Risk of Exposure/Tier 1 Patients Labs with Precautions)  Once,   STAT    Question Answer Comment  Is this test for diagnosis or screening Screening   Symptomatic for COVID-19 as defined by CDC No   Hospitalized for COVID-19 No   Admitted to ICU for COVID-19 No   Previously tested for COVID-19 Yes   Resident in a congregate (group) care setting No   Employed in healthcare setting No   Pregnant No      12/26/18 2158   12/26/18 2159  Lactic acid, plasma  Now then every 2 hours,   STAT     12/26/18 2158   12/26/18 2159  Blood culture (routine x 2)  BLOOD CULTURE X 2,   STAT     12/26/18 2158   12/26/18 1721  Urine culture  Add-on,   AD     12/26/18 1720   Signed and Held  HIV antibody (Routine Testing)  Once,   R     Signed and Held   Signed and Held  Comprehensive metabolic panel  Tomorrow morning,   R     Signed and Held   Signed and Held  CBC  Tomorrow morning,   R     Signed and Held          Vitals/Pain Today's Vitals   12/26/18 1900 12/26/18 1915 12/26/18 1930 12/26/18 2035  BP: 139/62 (!) 134/54 (!) 146/82   Pulse: 87 (!) 113 97   Resp:      Temp:      TempSrc:      SpO2: 100% 99% 100%   PainSc:    2     Isolation Precautions Airborne and Contact precautions  Medications Medications  sodium chloride flush (NS) 0.9 % injection 3 mL (3 mLs Intravenous Not Given 12/26/18 1839)  sodium chloride 0.9 % bolus 1,000 mL (0 mLs Intravenous Stopped 12/26/18 2038)  cefTRIAXone (ROCEPHIN) 1 g in sodium chloride 0.9 % 100 mL IVPB (0 g Intravenous Stopped 12/26/18 1946)  ketorolac (TORADOL) 15 MG/ML injection 15 mg (15 mg Intravenous  Given 12/26/18 1950)  acetaminophen (TYLENOL) tablet 650 mg (650 mg Oral Given 12/26/18 2214)    Mobility walks Low fall risk   Focused Assessments    R Recommendations: See Admitting Provider Note  Report given to:   Additional Notes:

## 2018-12-26 NOTE — MAU Note (Signed)
Transport called and notified of patient

## 2018-12-26 NOTE — MAU Note (Signed)
Heather Lynch is a 33 y.o. at Unknown here in MAU reporting: states the night before last she was having intercourse and was on top and since yesterday afternoon she has been having left lower abdominal and back pain. Started having chills last night, not having them anymore.  LMP: 08/03  Onset of complaint: yesterday  Pain score: 4/10, only hurts when moving  Vitals:   12/26/18 1346 12/26/18 1438  BP: (!) 154/77   Pulse: (!) 112 (!) 124  Resp: 18 18  Temp: 99.8 F (37.7 C) 98.9 F (37.2 C)  SpO2: 99% 100%     Lab orders placed from triage: UA

## 2018-12-26 NOTE — ED Triage Notes (Signed)
Pt states that she has been having LLQ abd pain today with vomiting, recent miscarriage two months ago, denies diarrhea, pt reports also having cold symptoms for the past week, fevers, coughing up phlegm

## 2018-12-26 NOTE — H&P (Signed)
History and Physical   [facility] ZOX:[id number] DOB: 08/13/1985 DOA: 12/26/2018  Referring MD/NP/PA: Dr. Rubin PayorPickering  PCP: Patient, No Pcp Per   Outpatient Specialists: None  Patient coming from: Home  Chief Complaint: Flank pain  HPI: Heather Lynch is a 33 y.o. female with medical history significant of recent spontaneous abortion, previous UTI, depression and morbid obesity who presented to the ER with left flank pain.  She has had on and off pain for about a week.  She has had remote history of gonorrhea.  Patient is very sexually active but one partner.  Pain comes and goes.  She started having chills this morning.  She described the pain as sharp radiating across her abdomen about 5 out of 10.  She came to the ER the day before but could not be seen.  She left before she was seen.  Patient had a D&C back in June when she had spontaneous abortion.  She now has some frequency and foul-smelling urine.  She was evaluated in the ER and appears to have UTI.  With the flank pain suspicion for pyelonephritis and patient is being admitted to the hospital for treatment..  ED Course: Temperature is 102.4 blood pressure 154/77 pulse 124 respirate 23 oxygen sat 99% room air.  White count is 15.4 hemoglobin 9.3 and platelets 383.  Chemistry appears to be within normal.  Urinalysis showed moderate leukocytes.  Nitrite is negative with WBC 11-20.  Urine pregnancy test is positive.  Patient is being admitted for possible pyelonephritis.  Review of Systems: As per HPI otherwise 10 point review of systems negative.    Past Medical History:  Diagnosis Date  . Anemia   . Depression    refuses to take meds  . Gonorrhea   . Heart murmur   . Pneumonia   . Preterm delivery 10/20/2018  . Spontaneous abortion 10/20/2018  . Urinary tract infection     Past Surgical History:  Procedure Laterality Date  . CESAREAN SECTION     X 5  . DILATION AND CURETTAGE OF UTERUS N/A 10/20/2018   Procedure:  DILATATION AND CURETTAGE;  Surgeon: Adam PhenixArnold, James G, MD;  Location: [facility];  Service: Gynecology;  Laterality: N/A;     reports that she quit smoking about 6 years ago. She has never used smokeless tobacco. She reports current alcohol use. She reports current drug use. Drug: Marijuana.  No Known Allergies  Family History  Problem Relation Age of Onset  . Hypertension Other   . Other Neg Hx      Prior to Admission medications   Medication Sig Start Date End Date Taking? Authorizing Provider  vitamin C (ASCORBIC ACID) 500 MG tablet Take 500 mg by mouth daily.   Yes [provider]  docusate sodium (COLACE) 100 MG capsule Take 1 capsule (100 mg total) by mouth 2 (two) times daily. Patient not taking: Reported on 11/27/2018 10/21/18   Constant, Peggy, MD  Fe Fum-FePoly-FA-Vit C-Vit B3 (INTEGRA F) 125-1 MG CAPS Take 1 capsule by mouth daily. Patient not taking: Reported on 12/26/2018 12/01/18   Amedeo GoryKarim-Rhoades, Walidah N, CNM  ferrous sulfate (FERROUSUL) 325 (65 FE) MG tablet Take 1 tablet (325 mg total) by mouth 2 (two) times daily. Patient not taking: Reported on 11/27/2018 10/21/18   Constant, Peggy, MD  ibuprofen (ADVIL) 600 MG tablet Take 1 tablet (600 mg total) by mouth every 6 (six) hours as needed. Patient not taking: Reported on 11/27/2018 10/21/18   Constant, Gigi GinPeggy, MD  Prenatal Vit-Fe Fumarate-FA (PRENATAL MULTIVITAMIN) TABS tablet Take 1 tablet by mouth daily at 12 noon.    [provider]    Physical Exam: Vitals:   12/26/18 1845 12/26/18 1900 12/26/18 1915 12/26/18 1930  BP: 139/66 139/62 (!) 134/54 (!) 146/82  Pulse: 88 87 (!) 113 97  Resp:      Temp:      TempSrc:      SpO2: 100% 100% 99% 100%      Constitutional: Morbidly obese, no distress Vitals:   12/26/18 1845 12/26/18 1900 12/26/18 1915 12/26/18 1930  BP: 139/66 139/62 (!) 134/54 (!) 146/82  Pulse: 88 87 (!) 113 97  Resp:      Temp:      TempSrc:      SpO2: 100% 100% 99% 100%   Eyes: PERRL,  lids and conjunctivae normal ENMT: Mucous membranes are dry. Posterior pharynx clear of any exudate or lesions.Normal dentition.  Neck: normal, supple, no masses, no thyromegaly Respiratory: clear to auscultation bilaterally, no wheezing, no crackles. Normal respiratory effort. No accessory muscle use.  Cardiovascular: Regular rate and rhythm, no murmurs / rubs / gallops. No extremity edema. 2+ pedal pulses. No carotid bruits.  Abdomen: Mild suprapubic tenderness, no masses palpated. No hepatosplenomegaly. Bowel sounds positive.  Musculoskeletal: no clubbing / cyanosis. No joint deformity upper and lower extremities. Good ROM, no contractures. Normal muscle tone.  Skin: no rashes, lesions, ulcers. No induration Neurologic: CN 2-12 grossly intact. Sensation intact, DTR normal. Strength 5/5 in all 4.  Psychiatric: Normal judgment and insight. Alert and oriented x 3. Normal mood.     Labs on Admission: I have personally reviewed following labs and imaging studies  CBC: Recent Labs  Lab 12/26/18 0024 12/26/18 1828  WBC 12.8* 18.4*  NEUTROABS  --  13.9*  HGB 10.6* 11.2*  HCT 35.0* 37.7  MCV 89.1 92.0  PLT 467* 193*   Basic Metabolic Panel: Recent Labs  Lab 12/26/18 0024 12/26/18 1828  NA 135 137  K 4.1 3.5  CL 102 102  CO2 25 22  GLUCOSE 112* 90  BUN <5* 8  CREATININE 0.80 0.84  CALCIUM 9.0 9.1   GFR: CrCl cannot be calculated (Unknown ideal weight.). Liver Function Tests: Recent Labs  Lab 12/26/18 0024 12/26/18 1828  AST 17 25  ALT 10 12  ALKPHOS 78 73  BILITOT 0.1* 0.5  PROT 7.5 7.4  ALBUMIN 3.7 3.8   Recent Labs  Lab 12/26/18 0024 12/26/18 1828  LIPASE 23 19   No results for input(s): AMMONIA in the last 168 hours. Coagulation Profile: No results for input(s): INR, PROTIME in the last 168 hours. Cardiac Enzymes: No results for input(s): CKTOTAL, CKMB, CKMBINDEX, TROPONINI in the last 168 hours. BNP (last 3 results) No results for input(s): PROBNP in  the last 8760 hours. HbA1C: No results for input(s): HGBA1C in the last 72 hours. CBG: No results for input(s): GLUCAP in the last 168 hours. Lipid Profile: No results for input(s): CHOL, HDL, LDLCALC, TRIG, CHOLHDL, LDLDIRECT in the last 72 hours. Thyroid Function Tests: No results for input(s): TSH, T4TOTAL, FREET4, T3FREE, THYROIDAB in the last 72 hours. Anemia Panel: No results for input(s): VITAMINB12, FOLATE, FERRITIN, TIBC, IRON, RETICCTPCT in the last 72 hours. Urine analysis:    Component Value Date/Time   COLORURINE STRAW (A) 12/26/2018 1441   APPEARANCEUR CLEAR 12/26/2018 1441   LABSPEC 1.003 (L) 12/26/2018 1441   PHURINE 6.0 12/26/2018 1441   GLUCOSEU NEGATIVE 12/26/2018 1441   HGBUR  SMALL (A) 12/26/2018 1441   BILIRUBINUR NEGATIVE 12/26/2018 1441   BILIRUBINUR small 11/27/2018 1114   KETONESUR NEGATIVE 12/26/2018 1441   PROTEINUR NEGATIVE 12/26/2018 1441   UROBILINOGEN 0.2 12/26/2018 1306   NITRITE NEGATIVE 12/26/2018 1441   LEUKOCYTESUR MODERATE (A) 12/26/2018 1441   Sepsis Labs: @LABRCNTIP (procalcitonin:4,lacticidven:4) ) Recent Results (from the past 240 hour(s))  Wet prep, genital     Status: Abnormal   Collection Time: 12/26/18  7:23 PM   Specimen: Thin Prep Cervical/Endocervical  Result Value Ref Range Status   Yeast Wet Prep HPF POC NONE SEEN NONE SEEN Final   Trich, Wet Prep NONE SEEN NONE SEEN Final   Clue Cells Wet Prep HPF POC NONE SEEN NONE SEEN Final   WBC, Wet Prep HPF POC FEW (A) NONE SEEN Final   Sperm NONE SEEN  Final    Comment: Performed at [facility], [address] [place], [place] [address]     Radiological Exams on Admission: Dg Chest Port 1 View  Result Date: 12/26/2018 CLINICAL DATA:  Congestion and fevers EXAM: PORTABLE CHEST 1 VIEW COMPARISON:  11/01/2015 FINDINGS: The heart size and mediastinal contours are within normal limits. Both lungs are clear. The visualized skeletal structures are unremarkable. IMPRESSION: No  active disease. Electronically Signed   By: Alcide CleverMark  Lukens M.D.   On: 12/26/2018 22:48      Assessment/Plan Principal Problem:   Acute pyelonephritis Active Problems:   Leucocytosis     #1 acute pyelonephritis: Patient will be admitted.  IV Rocephin which she will be safe with her positive pregnancy test.  Supportive care and wait for urine culture and sensitivities.  #2 leukocytosis and fever: Secondary to pyelonephritis.  #3 morbid obesity: Dietary advice.  #4+ urine pregnancy test: Patient's last menstrual period was just a week ago.  Patient may need further test to confirm early pregnancy.  Avoid medications that risky for first trimester.   DVT prophylaxis: SCD Code Status: Full code Family Communication: Significant other in the room with patient Disposition Plan: Home Consults called: None Admission status: Inpatient  Severity of Illness: The appropriate patient status for this patient is INPATIENT. Inpatient status is judged to be reasonable and necessary in order to provide the required intensity of service to ensure the patient's safety. The patient's presenting symptoms, physical exam findings, and initial radiographic and laboratory data in the context of their chronic comorbidities is felt to place them at high risk for further clinical deterioration. Furthermore, it is not anticipated that the patient will be medically stable for discharge from the hospital within 2 midnights of admission. The following factors support the patient status of inpatient.   " The patient's presenting symptoms include fever and chills with dysuria. " The worrisome physical exam findings include no significant finding on exam. " The initial radiographic and laboratory data are worrisome because of evidence of UTI. " The chronic co-morbidities include none.   * I certify that at the point of admission it is my clinical judgment that the patient will require inpatient hospital care spanning  beyond 2 midnights from the point of admission due to high intensity of service, high risk for further deterioration and high frequency of surveillance required.Lonia Blood*    GARBA,LAWAL MD [facility] Pager [phone]  If 7PM-7AM, please contact night-coverage [email or web address] Password [email or web address]  12/26/2018, 11:01 PM

## 2018-12-26 NOTE — MAU Note (Signed)
Transported arrived and took patient off the floor

## 2018-12-26 NOTE — ED Notes (Signed)
PA Kelly at bedside. 

## 2018-12-26 NOTE — ED Triage Notes (Addendum)
Pt here for eval of tenderness to left flank, abdominal pain with fever and chills since last night. Some dysuria.

## 2018-12-26 NOTE — MAU Note (Signed)
MAU RN received handoff for pending patient consisting of the following information: +Pregnancy test confirmed at Urgent Care +lower left flank pain +burning with urination D&C in history

## 2018-12-26 NOTE — ED Triage Notes (Signed)
Pt present abdominal pain with fever and chills. Symptoms started last night.

## 2018-12-26 NOTE — Discharge Instructions (Signed)
I do not think I have sufficient data to diagnose the source of your symptoms. I do think with your fever and pain it is appropriate to seek more thorough evaluation in the Er.  I recommend going there now.

## 2018-12-26 NOTE — ED Provider Notes (Signed)
[facility]    CSN: [id number] Arrival date & time: 12/26/18  1140     History   Chief Complaint Chief Complaint  Patient presents with  . Abdominal Pain  . Fever    HPI Heather Lynch is a 33 y.o. female.   [facility] with complaints of "head cold" symptoms with headache, facial congestion and post nasal drip. This started approximately 7/31. Headache improved. She was able to produce post nasal drip but no other specific cough. Yesterday went to work but developed abdominal pain. States it felt like gas, sharp, left flank. Nausea last night and emesis. Went to the ER, left prior to being seen due to wait time. This morning when she woke up she woke with chills. She drank Emergence-C, which helped some. Her stomach is still painful, left and now also right side. Feels feverish. Urine is darker in color, with odor. Has had this since her D&C, however History of miscarriage- June 2nd at 18 weeks. Had a D&C following this for retained products. Pain she has now is similar to when she had her D&C. No further vaginal bleeding, she is off her period. She has been sexually active since her D&C, without use of birth control.  Some urinary frequency. No diarrhea. No previous tylenol or ibuprofen. Soreness to abdomen.     ROS per HPI, negative if not otherwise mentioned.      Past Medical History:  Diagnosis Date  . Anemia   . Depression    refuses to take meds  . Gonorrhea   . Heart murmur   . Pneumonia   . Preterm delivery 10/20/2018  . Spontaneous abortion 10/20/2018  . Urinary tract infection     There are no active problems to display for this patient.   Past Surgical History:  Procedure Laterality Date  . CESAREAN SECTION     X 5  . DILATION AND CURETTAGE OF UTERUS N/A 10/20/2018   Procedure: DILATATION AND CURETTAGE;  Surgeon: Adam PhenixArnold, James G, MD;  Location: [facility];  Service: Gynecology;  Laterality: N/A;    OB History    Gravida  8   Para  8   Term  6   Preterm  1   AB      Living  7     SAB      TAB      Ectopic      Multiple  0   Live Births  7            Home Medications    Prior to Admission medications   Medication Sig Start Date End Date Taking? Authorizing Provider  docusate sodium (COLACE) 100 MG capsule Take 1 capsule (100 mg total) by mouth 2 (two) times daily. Patient not taking: Reported on 11/27/2018 10/21/18   Constant, Peggy, MD  Fe Fum-FePoly-FA-Vit C-Vit B3 (INTEGRA F) 125-1 MG CAPS Take 1 capsule by mouth daily. 12/01/18   Karim-Rhoades, Kae HellerWalidah N, CNM  ferrous sulfate (FERROUSUL) 325 (65 FE) MG tablet Take 1 tablet (325 mg total) by mouth 2 (two) times daily. Patient not taking: Reported on 11/27/2018 10/21/18   Constant, Peggy, MD  ibuprofen (ADVIL) 600 MG tablet Take 1 tablet (600 mg total) by mouth every 6 (six) hours as needed. Patient not taking: Reported on 11/27/2018 10/21/18   Constant, Peggy, MD  Prenatal Vit-Fe Fumarate-FA (PRENATAL MULTIVITAMIN) TABS tablet Take 1 tablet by mouth daily at 12 noon.    [provider]  Family History Family History  Problem Relation Age of Onset  . Hypertension Other   . Other Neg Hx     Social History Social History   Tobacco Use  . Smoking status: Former Smoker    Quit date: 12/30/2011    Years since quitting: 6.9  . Smokeless tobacco: Never Used  Substance Use Topics  . Alcohol use: Yes    Comment: occasional  . Drug use: Yes    Types: Marijuana    Comment: occassional     Allergies   Patient has no known allergies.   Review of Systems Review of Systems   Physical Exam Triage Vital Signs ED Triage Vitals [12/26/18 1200]  Enc Vitals Group     BP 112/67     Pulse Rate (!) 111     Resp 20     Temp (!) 100.4 F (38 C)     Temp Source Oral     SpO2 100 %     Weight      Height      Head Circumference      Peak Flow      Pain Score 8     Pain Loc      Pain Edu?      Excl. in [place]?    No data found.   Updated Vital Signs BP 112/67 (BP Location: Left Arm)   Pulse (!) 111   Temp (!) 100.4 F (38 C) (Oral)   Resp 20   LMP 12/22/2017   SpO2 100%   Visual Acuity Right Eye Distance:   Left Eye Distance:   Bilateral Distance:    Right Eye Near:   Left Eye Near:    Bilateral Near:     Physical Exam Constitutional:      General: She is not in acute distress.    Appearance: She is well-developed.  Cardiovascular:     Rate and Rhythm: Tachycardia present.     Heart sounds: Normal heart sounds.  Pulmonary:     Effort: Pulmonary effort is normal.  Abdominal:     Tenderness: There is abdominal tenderness in the right upper quadrant. There is left CVA tenderness.     Comments: Fairly impressive RUQ and Left CVA tenderness on palpation; no pain with activity, ambulatory in the room without apparent discomfort, sitting without apparent discomfort during history as well  Skin:    General: Skin is warm and dry.  Neurological:     Mental Status: She is alert and oriented to person, place, and time.      UC Treatments / Results  Labs (all labs ordered are listed, but only abnormal results are displayed) Labs Reviewed  POCT URINALYSIS DIP (DEVICE) - Abnormal; Notable for the following components:      Result Value   Hgb urine dipstick SMALL (*)    Protein, ur 100 (*)    Leukocytes,Ua SMALL (*)    All other components within normal limits  POCT PREGNANCY, URINE - Abnormal; Notable for the following components:   Preg Test, Ur POSITIVE (*)    All other components within normal limits  POC URINE PREG, ED    EKG   Radiology No results found.  Procedures Procedures (including critical care time)  Medications Ordered in UC Medications  acetaminophen (TYLENOL) tablet 650 mg (650 mg Oral Given 12/26/18 1232)  acetaminophen (TYLENOL) 325 MG tablet (has no administration in time range)    Initial Impression / Assessment and Plan / UC Course  I  have reviewed the triage vital  signs and the nursing notes.  Pertinent labs & imaging results that were available during my care of the patient were reviewed by me and considered in my medical decision making (see chart for details).     Pain, fever, tachycardia, without obvious source of these. WBC 12 in er last night. Noted HCG serum was negative last night, but urine pregnancy is positive here in clinic, observed even by this provider. No pelvic pain or vaginal bleeding. D&C was in June. I do recommend that patient seek more thorough evaluation in the er at this time to determine a more definitive source of her symptoms. Patient agreeable, family member transporting her to the ER.  Final Clinical Impressions(s) / UC Diagnoses   Final diagnoses:  Right upper quadrant abdominal pain  Left flank pain  Fever, unspecified fever cause  Tachycardia     Discharge Instructions     I do not think I have sufficient data to diagnose the source of your symptoms. I do think with your fever and pain it is appropriate to seek more thorough evaluation in the Er.  I recommend going there now.     ED Prescriptions    None     Controlled Substance Prescriptions [place] Controlled Substance Registry consulted? Not Applicable   Georgetta HaberBurky, Flemon Kelty B, NP 12/26/18 1329

## 2018-12-27 DIAGNOSIS — N10 Acute tubulo-interstitial nephritis: Secondary | ICD-10-CM

## 2018-12-27 DIAGNOSIS — A419 Sepsis, unspecified organism: Secondary | ICD-10-CM

## 2018-12-27 DIAGNOSIS — Z3201 Encounter for pregnancy test, result positive: Secondary | ICD-10-CM

## 2018-12-27 LAB — COMPREHENSIVE METABOLIC PANEL
ALT: 12 U/L (ref 0–44)
AST: 26 U/L (ref 15–41)
Albumin: 2.9 g/dL — ABNORMAL LOW (ref 3.5–5.0)
Alkaline Phosphatase: 63 U/L (ref 38–126)
Anion gap: 13 (ref 5–15)
BUN: 6 mg/dL (ref 6–20)
CO2: 23 mmol/L (ref 22–32)
Calcium: 8.6 mg/dL — ABNORMAL LOW (ref 8.9–10.3)
Chloride: 101 mmol/L (ref 98–111)
Creatinine, Ser: 0.92 mg/dL (ref 0.44–1.00)
GFR calc Af Amer: >60 mL/min (ref >60)
GFR calc non Af Amer: >60 mL/min (ref >60)
Glucose, Bld: 101 mg/dL — ABNORMAL HIGH (ref 70–99)
Potassium: 3.2 mmol/L — ABNORMAL LOW (ref 3.5–5.1)
Sodium: 137 mmol/L (ref 135–145)
Total Bilirubin: 0.4 mg/dL (ref 0.3–1.2)
Total Protein: 6.4 g/dL — ABNORMAL LOW (ref 6.5–8.1)

## 2018-12-27 LAB — LACTIC ACID, PLASMA: Lactic Acid, Venous: 1.8 mmol/L (ref 0.5–1.9)

## 2018-12-27 LAB — CBC
HCT: 29.1 % — ABNORMAL LOW (ref 36.0–46.0)
Hemoglobin: 9.3 g/dL — ABNORMAL LOW (ref 12.0–15.0)
MCH: 27.7 pg (ref 26.0–34.0)
MCHC: 32 g/dL (ref 30.0–36.0)
MCV: 86.6 fL (ref 80.0–100.0)
Platelets: 383 10*3/uL (ref 150–400)
RBC: 3.36 MIL/uL — ABNORMAL LOW (ref 3.87–5.11)
RDW: 15.3 % (ref 11.5–15.5)
WBC: 15.4 10*3/uL — ABNORMAL HIGH (ref 4.0–10.5)
nRBC: 0 % (ref 0.0–0.2)

## 2018-12-27 LAB — HCG, QUANTITATIVE, PREGNANCY: hCG, Beta Chain, Quant, S: 3 m[IU]/mL (ref <5)

## 2018-12-27 LAB — HIV ANTIBODY (ROUTINE TESTING W REFLEX): HIV Screen 4th Generation wRfx: NONREACTIVE

## 2018-12-27 MED ORDER — DEXTROSE-NACL 5-0.45 % IV SOLN
INTRAVENOUS | Status: DC
  Administered 2018-12-27 – 2018-12-30 (×6): via INTRAVENOUS

## 2018-12-27 MED ORDER — POTASSIUM CHLORIDE 20 MEQ PO PACK
40.0000 meq | PACK | Freq: Once | ORAL | Status: AC
  Administered 2018-12-27: 13:00:00 40 meq via ORAL
  Filled 2018-12-27: qty 2

## 2018-12-27 MED ORDER — SUMATRIPTAN SUCCINATE 50 MG PO TABS
50.0000 mg | ORAL_TABLET | Freq: Once | ORAL | Status: AC
  Administered 2018-12-28: 50 mg via ORAL
  Filled 2018-12-27: qty 1

## 2018-12-27 MED ORDER — DOXYCYCLINE HYCLATE 100 MG PO TABS: 100.0000 mg | ORAL_TABLET | Freq: Two times a day (BID) | ORAL | Status: DC

## 2018-12-27 MED ORDER — IBUPROFEN 400 MG PO TABS
400.0000 mg | ORAL_TABLET | Freq: Once | ORAL | Status: AC
  Administered 2018-12-27: 06:00:00 400 mg via ORAL
  Filled 2018-12-27: qty 1

## 2018-12-27 MED ORDER — ONDANSETRON HCL 4 MG/2ML IJ SOLN
4.0000 mg | Freq: Four times a day (QID) | INTRAMUSCULAR | Status: DC | PRN
  Administered 2018-12-29: 4 mg via INTRAVENOUS
  Filled 2018-12-27: qty 2

## 2018-12-27 MED ORDER — ACETAMINOPHEN 325 MG PO TABS
650.0000 mg | ORAL_TABLET | Freq: Four times a day (QID) | ORAL | Status: DC | PRN
  Administered 2018-12-27 – 2018-12-29 (×6): 650 mg via ORAL
  Filled 2018-12-27 (×7): qty 2

## 2018-12-27 MED ORDER — ONDANSETRON HCL 4 MG PO TABS: 4.0000 mg | ORAL_TABLET | Freq: Four times a day (QID) | ORAL | Status: DC | PRN

## 2018-12-27 MED ORDER — SODIUM CHLORIDE 0.9 % IV SOLN
1.0000 g | INTRAVENOUS | Status: DC
  Administered 2018-12-27 – 2018-12-28 (×2): 1 g via INTRAVENOUS
  Filled 2018-12-27: qty 10
  Filled 2018-12-27 (×2): qty 1

## 2018-12-27 NOTE — Plan of Care (Signed)
  Problem: Education: Goal: Knowledge of General Education information will improve Description Including pain rating scale, medication(s)/side effects and non-pharmacologic comfort measures Outcome: Progressing   

## 2018-12-27 NOTE — Progress Notes (Addendum)
PROGRESS NOTE    Heather Lynch  ZOX:[id number]  DOB: 04/27/1986  DOA: 12/26/2018 PCP: Patient, No Pcp Per  Brief Narrative:  33 y.o. female with medical history significant of recent spontaneous abortion, previous UTI, depression, remote history of gonorrhea, who recently had spontaneous abortion in June and underwent D&C by [facility] presented to the ER with left lateral abdominal pain as well as flank pain so seated with chills.  She states her LMP was last week.   She has had on and off lower abdominal discomfort/dysuria for which she was evaluated by primary GYN with urine analysis twice which apparently were negative.  She reports severe vaginal discomfort and onset of lower abdominal pain after intercourse 2 days back.  She was evaluated in the ER and UA suggestive of UTI.  She is admitted to hospitalist service for complicated UTI/pyelonephritis with IV antibiotics.  Her urine pregnancy test was positive in the ED.  Subjective:  Patient states she feels better today.  T-max of 102.74F yesterday.   Objective: Vitals:   12/27/18 0606 12/27/18 0625 12/27/18 0800 12/27/18 1041  BP: 100/66 110/62  112/72  Pulse: (!) 110   84  Resp: 19   16  Temp: (!) 102.7 F (39.3 C)  (!) 100.7 F (38.2 C) 99.1 F (37.3 C)  TempSrc: Oral  Oral Oral  SpO2: 98%   100%    Intake/Output Summary (Last 24 hours) at 12/27/2018 1231 Last data filed at 12/27/2018 0900 Gross per 24 hour  Intake 746.3 ml  Output --  Net 746.3 ml   There were no vitals filed for this visit.  Physical Examination:  General exam: Appears calm and comfortable  Respiratory system: Clear to auscultation. Respiratory effort normal. Cardiovascular system: S1 & S2 heard, RRR. No JVD, murmurs, rubs, gallops or clicks. No pedal edema. Gastrointestinal system: Abdomen is nondistended, soft and nontender.  Mild persistent left CVA tenderness.  No organomegaly or masses felt. Normal bowel sounds heard. Central nervous  system: Alert and oriented. No focal neurological deficits. Extremities: Symmetric 5 x 5 power. Skin: No rashes, lesions or ulcers Psychiatry: Judgement and insight appear normal. Mood & affect appropriate.     Data Reviewed: I have personally reviewed following labs and imaging studies  CBC: Recent Labs  Lab 12/26/18 0024 12/26/18 1828 12/27/18 0259  WBC 12.8* 18.4* 15.4*  NEUTROABS  --  13.9*  --   HGB 10.6* 11.2* 9.3*  HCT 35.0* 37.7 29.1*  MCV 89.1 92.0 86.6  PLT 467* 415* 383   Basic Metabolic Panel: Recent Labs  Lab 12/26/18 0024 12/26/18 1828 12/27/18 0259  NA 135 137 137  K 4.1 3.5 3.2*  CL 102 102 101  CO2 25 22 23   GLUCOSE 112* 90 101*  BUN <5* 8 6  CREATININE 0.80 0.84 0.92  CALCIUM 9.0 9.1 8.6*   GFR: CrCl cannot be calculated (Unknown ideal weight.). Liver Function Tests: Recent Labs  Lab 12/26/18 0024 12/26/18 1828 12/27/18 0259  AST 17 25 26   ALT 10 12 12   ALKPHOS 78 73 63  BILITOT 0.1* 0.5 0.4  PROT 7.5 7.4 6.4*  ALBUMIN 3.7 3.8 2.9*   Recent Labs  Lab 12/26/18 0024 12/26/18 1828  LIPASE 23 19   No results for input(s): AMMONIA in the last 168 hours. Coagulation Profile: No results for input(s): INR, PROTIME in the last 168 hours. Cardiac Enzymes: No results for input(s): CKTOTAL, CKMB, CKMBINDEX, TROPONINI in the last 168 hours. BNP (last 3 results) No results  for input(s): PROBNP in the last 8760 hours. HbA1C: No results for input(s): HGBA1C in the last 72 hours. CBG: No results for input(s): GLUCAP in the last 168 hours. Lipid Profile: No results for input(s): CHOL, HDL, LDLCALC, TRIG, CHOLHDL, LDLDIRECT in the last 72 hours. Thyroid Function Tests: No results for input(s): TSH, T4TOTAL, FREET4, T3FREE, THYROIDAB in the last 72 hours. Anemia Panel: No results for input(s): VITAMINB12, FOLATE, FERRITIN, TIBC, IRON, RETICCTPCT in the last 72 hours. Sepsis Labs: Recent Labs  Lab 12/26/18 2233 12/27/18 0259  LATICACIDVEN  2.2* 1.8    Recent Results (from the past 240 hour(s))  Wet prep, genital     Status: Abnormal   Collection Time: 12/26/18  7:23 PM   Specimen: Thin Prep Cervical/Endocervical  Result Value Ref Range Status   Yeast Wet Prep HPF POC NONE SEEN NONE SEEN Final   Trich, Wet Prep NONE SEEN NONE SEEN Final   Clue Cells Wet Prep HPF POC NONE SEEN NONE SEEN Final   WBC, Wet Prep HPF POC FEW (A) NONE SEEN Final   Sperm NONE SEEN  Final    Comment: Performed at [facility], [address] [place], [place] [address]  SARS Coronavirus 2 [facility](Hospital order, Performed in [facility] hospital lab) Nasopharyngeal Nasopharyngeal Swab     Status: None   Collection Time: 12/26/18 10:22 PM   Specimen: Nasopharyngeal Swab  Result Value Ref Range Status   SARS Coronavirus 2 NEGATIVE NEGATIVE Final    Comment: (NOTE) If result is NEGATIVE SARS-CoV-2 target nucleic acids are NOT DETECTED. The SARS-CoV-2 RNA is generally detectable in upper and lower  respiratory specimens during the acute phase of infection. The lowest  concentration of SARS-CoV-2 viral copies this assay can detect is 250  copies / mL. A negative result does not preclude SARS-CoV-2 infection  and should not be used as the sole basis for treatment or other  patient management decisions.  A negative result may occur with  improper specimen collection / handling, submission of specimen other  than nasopharyngeal swab, presence of viral mutation(s) within the  areas targeted by this assay, and inadequate number of viral copies  (<250 copies / mL). A negative result must be combined with clinical  observations, patient history, and epidemiological information. If result is POSITIVE SARS-CoV-2 target nucleic acids are DETECTED. The SARS-CoV-2 RNA is generally detectable in upper and lower  respiratory specimens dur ing the acute phase of infection.  Positive  results are indicative of active infection with SARS-CoV-2.  Clinical    correlation with patient history and other diagnostic information is  necessary to determine patient infection status.  Positive results do  not rule out bacterial infection or co-infection with other viruses. If result is PRESUMPTIVE POSTIVE SARS-CoV-2 nucleic acids MAY BE PRESENT.   A presumptive positive result was obtained on the submitted specimen  and confirmed on repeat testing.  While 2019 novel coronavirus  (SARS-CoV-2) nucleic acids may be present in the submitted sample  additional confirmatory testing may be necessary for epidemiological  and / or clinical management purposes  to differentiate between  SARS-CoV-2 and other Sarbecovirus currently known to infect humans.  If clinically indicated additional testing with an alternate test  methodology [phone]([id number]) is advised. The SARS-CoV-2 RNA is generally  detectable in upper and lower respiratory sp ecimens during the acute  phase of infection. The expected result is Negative. Fact Sheet for Patients:  BoilerBrush.com.cyhttps://www.fda.gov/media/136312/download Fact Sheet for Healthcare Providers: https://pope.com/https://www.fda.gov/media/136313/download This test is  not yet approved or cleared by the [place] and has been authorized for detection and/or diagnosis of SARS-CoV-2 by FDA under an Emergency Use Authorization (EUA).  This EUA will remain in effect (meaning this test can be used) for the duration of the COVID-19 declaration under Section 564(b)(1) of the Act, 21 U.S.C. section 360bbb-3(b)(1), unless the authorization is terminated or revoked sooner. Performed at [facility], [place] [address] [place], [place] [address]       Radiology Studies: Dg Chest Port 1 View  Result Date: 12/26/2018 CLINICAL DATA:  Congestion and fevers EXAM: PORTABLE CHEST 1 VIEW COMPARISON:  11/01/2015 FINDINGS: The heart size and mediastinal contours are within normal limits. Both lungs are clear. The visualized skeletal structures are unremarkable.  IMPRESSION: No active disease. Electronically Signed   By: Inez Catalina M.D.   On: 12/26/2018 22:48        Scheduled Meds:  sodium chloride flush  3 mL Intravenous Once   Continuous Infusions:  cefTRIAXone (ROCEPHIN)  IV     dextrose 5 % and 0.45% NaCl 75 mL/hr at 12/27/18 0432    Assessment & Plan:    1.  Acute left-sided pyelonephritis with sepsis POA: Patient febrile with T-max of 102.39f, tachycardic on presentation with labs showing leukocytosis.  Continue IV ceftriaxone/IV fluids.  She is still spiking fevers and has leukocytosis 15-18 K.  Follow-up blood cultures.  Requested GYN to evaluate patient and rule out pelvic inflammatory disease given recent intervention/D&C.  Consider CT abdomen/pelvis if continues to spike fevers to rule out renal microabscesses/other intra-abdominal infections if and when okay per GYN in the setting of problem #2.  2.  Positive pregnancy test: Her urine pregnancy test resulted positive but it appears that serum pregnancy test is negative.?  Declining beta-hCG levels from recent pregnancy.  OB/GYN to advise  3.  History of STD/gonorrhea: Wet prep currently negative for trichomonas/clue cells or yeast.  GC/chlamydia pending.  On ceftriaxone. ?add doxycycline (not sure of the significance of #2)  4.  Hypokalemia: Replace    DVT prophylaxis: SCD  code Status: Full code Family / Patient Communication: Discussed with patient in detail and all questions answered.  Discussed with OB/GYN on-call Disposition Plan: Home when medically cleared     LOS: 1 day    Time spent: 35 minutes.    Guilford Shi, MD [facility] Pager [phone]  If 7PM-7AM, please contact night-coverage [email or web address] Password [email or web address] 12/27/2018, 12:31 PM

## 2018-12-27 NOTE — Consult Note (Signed)
Reason for Consult: + UPT in setting of pyelonephritis Referring Physician: [facility]  Heather Lynch is an 33 y.o. female [id number] with LMP 11/27/18. Admitted to [facility] with pyelonephritis, noted to have a + UPT. Pt had a 17 SAB 10/20/18 with retained placenta requiring a D & C. LMP as noted above. Sexual active without contraception   Menstrual History: Menarche age: [age] Patient's last menstrual period was 12/21/2018.    Past Medical History:  Diagnosis Date  . Anemia   . Depression    refuses to take meds  . Gonorrhea   . Heart murmur   . Pneumonia   . Preterm delivery 10/20/2018  . Spontaneous abortion 10/20/2018  . Urinary tract infection     Past Surgical History:  Procedure Laterality Date  . CESAREAN SECTION     X 5  . DILATION AND CURETTAGE OF UTERUS N/A 10/20/2018   Procedure: DILATATION AND CURETTAGE;  Surgeon: Adam PhenixArnold, James G, MD;  Location: [facility] LD ORS;  Service: Gynecology;  Laterality: N/A;    Family History  Problem Relation Age of Onset  . Hypertension Other   . Other Neg Hx     Social History:  reports that she quit smoking about 6 years ago. She has never used smokeless tobacco. She reports current alcohol use. She reports current drug use. Drug: Marijuana.  Allergies: No Known Allergies  Medications: I have reviewed the patient's current medications.  ROS As per HPI  PE  Lungs clear Heart RRR Abd soft + BS  GU deferred Ext non tender   Results for orders placed or performed during the hospital encounter of 12/26/18 (from the past 48 hour(s))  hCG, serum, qualitative     Status: None   Collection Time: 12/26/18  2:23 PM  Result Value Ref Range   Preg, Serum NEGATIVE NEGATIVE    Comment:        THE SENSITIVITY OF THIS METHODOLOGY IS >10 mIU/mL. Performed at [facility], [address] [place], [place] [address]   Urinalysis, Routine w reflex microscopic     Status: Abnormal   Collection Time: 12/26/18  2:41  PM  Result Value Ref Range   Color, Urine STRAW (A) YELLOW   APPearance CLEAR CLEAR   Specific Gravity, Urine 1.003 (L) 1.005 - 1.030   pH 6.0 5.0 - 8.0   Glucose, UA NEGATIVE NEGATIVE mg/dL   Hgb urine dipstick SMALL (A) NEGATIVE   Bilirubin Urine NEGATIVE NEGATIVE   Ketones, ur NEGATIVE NEGATIVE mg/dL   Protein, ur NEGATIVE NEGATIVE mg/dL   Nitrite NEGATIVE NEGATIVE   Leukocytes,Ua MODERATE (A) NEGATIVE   WBC, UA 11-20 0 - 5 WBC/hpf   Bacteria, UA RARE (A) NONE SEEN   Squamous Epithelial / LPF 0-5 0 - 5    Comment: Performed at [facility], [address] [place], [place] [address]  Lipase, blood     Status: None   Collection Time: 12/26/18  6:28 PM  Result Value Ref Range   Lipase 19 11 - 51 U/L    Comment: Performed at [facility], [address] [place], [place] [address]  Comprehensive metabolic panel     Status: None   Collection Time: 12/26/18  6:28 PM  Result Value Ref Range   Sodium 137 135 - 145 mmol/L   Potassium 3.5 3.5 - 5.1 mmol/L   Chloride 102 98 - 111 mmol/L   CO2 22 22 - 32 mmol/L   Glucose, Bld  90 70 - 99 mg/dL   BUN 8 6 - 20 mg/dL   Creatinine, Ser 1.610.84 0.44 - 1.00 mg/dL   Calcium 9.1 8.9 - 09.610.3 mg/dL   Total Protein 7.4 6.5 - 8.1 g/dL   Albumin 3.8 3.5 - 5.0 g/dL   AST 25 15 - 41 U/L   ALT 12 0 - 44 U/L   Alkaline Phosphatase 73 38 - 126 U/L   Total Bilirubin 0.5 0.3 - 1.2 mg/dL   GFR calc non Af Amer >60 >60 mL/min   GFR calc Af Amer >60 >60 mL/min   Anion gap 13 5 - 15    Comment: Performed at [facility], [address] [place], [place] [address]  CBC with Differential     Status: Abnormal   Collection Time: 12/26/18  6:28 PM  Result Value Ref Range   WBC 18.4 (H) 4.0 - 10.5 K/uL   RBC 4.10 3.87 - 5.11 MIL/uL   Hemoglobin 11.2 (L) 12.0 - 15.0 g/dL   HCT 98.137.7 19.136.0 - 47.846.0 %   MCV 92.0 80.0 - 100.0 fL   MCH 27.3 26.0 - 34.0 pg   MCHC 29.7 (L) 30.0 - 36.0 g/dL   RDW 29.515.3 62.111.5 - 30.815.5 %   Platelets 415 (H) 150 - 400 K/uL    nRBC 0.0 0.0 - 0.2 %   Neutrophils Relative % 76 %   Neutro Abs 13.9 (H) 1.7 - 7.7 K/uL   Lymphocytes Relative 17 %   Lymphs Abs 3.1 0.7 - 4.0 K/uL   Monocytes Relative 6 %   Monocytes Absolute 1.2 (H) 0.1 - 1.0 K/uL   Eosinophils Relative 1 %   Eosinophils Absolute 0.1 0.0 - 0.5 K/uL   Basophils Relative 0 %   Basophils Absolute 0.0 0.0 - 0.1 K/uL   Immature Granulocytes 0 %   Abs Immature Granulocytes 0.07 0.00 - 0.07 K/uL    Comment: Performed at [facility], [address] [place], [place] [address]  Wet prep, genital     Status: Abnormal   Collection Time: 12/26/18  7:23 PM   Specimen: Thin Prep Cervical/Endocervical  Result Value Ref Range   Yeast Wet Prep HPF POC NONE SEEN NONE SEEN   Trich, Wet Prep NONE SEEN NONE SEEN   Clue Cells Wet Prep HPF POC NONE SEEN NONE SEEN   WBC, Wet Prep HPF POC FEW (A) NONE SEEN   Sperm NONE SEEN     Comment: Performed at [facility], [address] [place], [place] [address]  SARS Coronavirus 2 [facility](Hospital order, Performed in [facility] hospital lab) Nasopharyngeal Nasopharyngeal Swab     Status: None   Collection Time: 12/26/18 10:22 PM   Specimen: Nasopharyngeal Swab  Result Value Ref Range   SARS Coronavirus 2 NEGATIVE NEGATIVE    Comment: (NOTE) If result is NEGATIVE SARS-CoV-2 target nucleic acids are NOT DETECTED. The SARS-CoV-2 RNA is generally detectable in upper and lower  respiratory specimens during the acute phase of infection. The lowest  concentration of SARS-CoV-2 viral copies this assay can detect is 250  copies / mL. A negative result does not preclude SARS-CoV-2 infection  and should not be used as the sole basis for treatment or other  patient management decisions.  A negative result may occur with  improper specimen collection / handling, submission of specimen other  than nasopharyngeal swab, presence of viral mutation(s) within the  areas targeted by this assay, and inadequate number of  viral  copies  (<250 copies / mL). A negative result must be combined with clinical  observations, patient history, and epidemiological information. If result is POSITIVE SARS-CoV-2 target nucleic acids are DETECTED. The SARS-CoV-2 RNA is generally detectable in upper and lower  respiratory specimens dur ing the acute phase of infection.  Positive  results are indicative of active infection with SARS-CoV-2.  Clinical  correlation with patient history and other diagnostic information is  necessary to determine patient infection status.  Positive results do  not rule out bacterial infection or co-infection with other viruses. If result is PRESUMPTIVE POSTIVE SARS-CoV-2 nucleic acids MAY BE PRESENT.   A presumptive positive result was obtained on the submitted specimen  and confirmed on repeat testing.  While 2019 novel coronavirus  (SARS-CoV-2) nucleic acids may be present in the submitted sample  additional confirmatory testing may be necessary for epidemiological  and / or clinical management purposes  to differentiate between  SARS-CoV-2 and other Sarbecovirus currently known to infect humans.  If clinically indicated additional testing with an alternate test  methodology [phone]) is advised. The SARS-CoV-2 RNA is generally  detectable in upper and lower respiratory sp ecimens during the acute  phase of infection. The expected result is Negative. Fact Sheet for Patients:  StrictlyIdeas.no Fact Sheet for Healthcare Providers: BankingDealers.co.za This test is not yet approved or cleared by the [place] FDA and has been authorized for detection and/or diagnosis of SARS-CoV-2 by FDA under an Emergency Use Authorization (EUA).  This EUA will remain in effect (meaning this test can be used) for the duration of the COVID-19 declaration under Section 564(b)(1) of the Act, 21 U.S.C. section 360bbb-3(b)(1), unless the authorization is terminated  or revoked sooner. Performed at [facility], [place] [address], [name], [place] [address]   Lactic acid, plasma     Status: Abnormal   Collection Time: 12/26/18 10:33 PM  Result Value Ref Range   Lactic Acid, Venous 2.2 (HH) 0.5 - 1.9 mmol/L    Comment: CRITICAL RESULT CALLED TO, READ BACK BY AND VERIFIED WITHCyndee Brightly [id number] [facility] Performed at [facility], [place] [address] [place], [place] [address]   Lactic acid, plasma     Status: None   Collection Time: 12/27/18  2:59 AM  Result Value Ref Range   Lactic Acid, Venous 1.8 0.5 - 1.9 mmol/L    Comment: Performed at [place] [address], [place], [place] [address]  HIV antibody (Routine Testing)     Status: None   Collection Time: 12/27/18  2:59 AM  Result Value Ref Range   HIV Screen 4th Generation wRfx Non Reactive Non Reactive    Comment: (NOTE) Performed At: [facility] [place], [place] [id number] Rush Farmer MD PZ:[id number]   Comprehensive metabolic panel     Status: Abnormal   Collection Time: 12/27/18  2:59 AM  Result Value Ref Range   Sodium 137 135 - 145 mmol/L   Potassium 3.2 (L) 3.5 - 5.1 mmol/L   Chloride 101 98 - 111 mmol/L   CO2 23 22 - 32 mmol/L   Glucose, Bld 101 (H) 70 - 99 mg/dL   BUN 6 6 - 20 mg/dL   Creatinine, Ser 0.92 0.44 - 1.00 mg/dL   Calcium 8.6 (L) 8.9 - 10.3 mg/dL   Total Protein 6.4 (L) 6.5 - 8.1 g/dL   Albumin 2.9 (L) 3.5 - 5.0 g/dL   AST 26 15 - 41  U/L   ALT 12 0 - 44 U/L   Alkaline Phosphatase 63 38 - 126 U/L   Total Bilirubin 0.4 0.3 - 1.2 mg/dL   GFR calc non Af Amer >60 >60 mL/min   GFR calc Af Amer >60 >60 mL/min   Anion gap 13 5 - 15    Comment: Performed at [facility], [address] [place], [place] [address]  CBC     Status: Abnormal   Collection Time: 12/27/18  2:59 AM  Result Value Ref Range   WBC 15.4 (H) 4.0 - 10.5 K/uL   RBC 3.36 (L) 3.87 - 5.11 MIL/uL   Hemoglobin 9.3 (L) 12.0 - 15.0 g/dL   HCT  60.429.1 (L) 54.036.0 - 46.0 %   MCV 86.6 80.0 - 100.0 fL   MCH 27.7 26.0 - 34.0 pg   MCHC 32.0 30.0 - 36.0 g/dL   RDW 98.115.3 19.111.5 - 47.815.5 %   Platelets 383 150 - 400 K/uL   nRBC 0.0 0.0 - 0.2 %    Comment: Performed at [facility], [address] [place], [place] [address]  hCG, quantitative, pregnancy     Status: None   Collection Time: 12/27/18  2:59 AM  Result Value Ref Range   hCG, Beta Chain, Quant, S 3 <5 mIU/mL    Comment:          GEST. AGE      CONC.  (mIU/mL)   <=1 WEEK        5 - 50     2 WEEKS       50 - 500     3 WEEKS       100 - 10,000     4 WEEKS     1,000 - 30,000     5 WEEKS     3,500 - 115,000   6-8 WEEKS     12,000 - 270,000    12 WEEKS     15,000 - 220,000        FEMALE AND NON-PREGNANT FEMALE:     LESS THAN 5 mIU/mL Performed at [facility], [address] [place], [place] [address]     Dg Chest Port 1 View  Result Date: 12/26/2018 CLINICAL DATA:  Congestion and fevers EXAM: PORTABLE CHEST 1 VIEW COMPARISON:  11/01/2015 FINDINGS: The heart size and mediastinal contours are within normal limits. Both lungs are clear. The visualized skeletal structures are unremarkable. IMPRESSION: No active disease. Electronically Signed   By: Alcide CleverMark  Lukens M.D.   On: 12/26/2018 22:48    Assessment/Plan: False positive BHCG Pyelonephritis.  Quant BHCG is negative. No concern for pregnancy at this time. Pt missed her yearly GYN exam this past week. Encouraged the pt to reschedule once discharge form the hospital to complete routine health maintance including pap smear and pelvic exam. Plus to discuss contraception. Will sign off. Please reconsult us if any further concerns during this hospitalization.    Hermina StaggersMichael L Henny Strauch 12/27/2018

## 2018-12-28 DIAGNOSIS — A4151 Sepsis due to Escherichia coli [E. coli]: Principal | ICD-10-CM

## 2018-12-28 LAB — CBC
HCT: 30.8 % — ABNORMAL LOW (ref 36.0–46.0)
Hemoglobin: 9.5 g/dL — ABNORMAL LOW (ref 12.0–15.0)
MCH: 27.1 pg (ref 26.0–34.0)
MCHC: 30.8 g/dL (ref 30.0–36.0)
MCV: 88 fL (ref 80.0–100.0)
Platelets: 347 10*3/uL (ref 150–400)
RBC: 3.5 MIL/uL — ABNORMAL LOW (ref 3.87–5.11)
RDW: 15.4 % (ref 11.5–15.5)
WBC: 9.4 10*3/uL (ref 4.0–10.5)
nRBC: 0 % (ref 0.0–0.2)

## 2018-12-28 LAB — BASIC METABOLIC PANEL
Anion gap: 9 (ref 5–15)
BUN: <5 mg/dL — ABNORMAL LOW (ref 6–20)
CO2: 23 mmol/L (ref 22–32)
Calcium: 8.4 mg/dL — ABNORMAL LOW (ref 8.9–10.3)
Chloride: 104 mmol/L (ref 98–111)
Creatinine, Ser: 0.88 mg/dL (ref 0.44–1.00)
GFR calc Af Amer: >60 mL/min (ref >60)
GFR calc non Af Amer: >60 mL/min (ref >60)
Glucose, Bld: 99 mg/dL (ref 70–99)
Potassium: 3.9 mmol/L (ref 3.5–5.1)
Sodium: 136 mmol/L (ref 135–145)

## 2018-12-28 LAB — BETA HCG QUANT (REF LAB): hCG Quant: <1 m[IU]/mL

## 2018-12-28 MED ORDER — IBUPROFEN 400 MG PO TABS
400.0000 mg | ORAL_TABLET | Freq: Once | ORAL | Status: AC
  Administered 2018-12-28: 400 mg via ORAL
  Filled 2018-12-28: qty 1

## 2018-12-28 MED ORDER — IBUPROFEN 600 MG PO TABS
600.0000 mg | ORAL_TABLET | Freq: Four times a day (QID) | ORAL | Status: DC | PRN
  Administered 2018-12-28 – 2018-12-30 (×3): 600 mg via ORAL
  Filled 2018-12-28 (×4): qty 1

## 2018-12-28 NOTE — Progress Notes (Signed)
PROGRESS NOTE    [name]  ZOX:[id number] DOB: 04/20/1986 DOA: 12/26/2018 PCP: Patient, No Pcp Per      Brief Narrative:  Heather Lynch is a 33 y.o. F with hx recurrent UTI who presents with fever, flank pain, vomiting.  In ER, found to have temp 102F, UA with bacteria/pyuria.  Started on ceftriaxone.   Assessment & Plan:  Sepsis with pyelonephritis Admitted with fever, leukocytosis and lactate 2.2. Source urine. Culture growing  E coli, sensitivities pending.  Still fevering today. Blood cultures negative to date.  Urine pregnancy positive on admisison, follow up serum quant HCG negative.  Seen by OB-gyn, retained products ruled out. Doubt PID.  -Continue ceftraixone -Follow urine sensitivities -Follow blood culture -If persistent fever, obtain CT abdomen and pelvis to rule out abscess -Follow GC/chlamydia probe   Headache Improved with ibuprofen overnight.   Anemia Unclear cause. -Check iron studies  Hypokalemia     MDM and disposition: The below labs and imaging reports were reviewed and summarized above.  Medication management as above.  The patient was admitted with sepsis from pyelonephritis.    She is stil fevering, still with pain.  Will continue IV antibiotics, if able to take PO, if pain improved, possibly home tomorrow      DVT prophylaxis: Lovenox Code Status: FULL Family Communication:     Consultants:   OB    Subjective: Feeling severe headache, very achy, weak, tired.  No confusion.  Still fever overnight.  Objective: Vitals:   12/28/18 0018 12/28/18 0424 12/28/18 0548 12/28/18 1007  BP: 104/65 113/65  117/60  Pulse: 97 (!) 107  69  Resp: 16 16  17   Temp: 100.3 F (37.9 C) (!) 103 F (39.4 C) (!) 101.7 F (38.7 C) 97.9 F (36.6 C)  TempSrc: Oral Oral Oral Oral  SpO2: 96% 99%  100%    Intake/Output Summary (Last 24 hours) at 12/28/2018 1900 Last data filed at 12/28/2018 1516 Gross per 24 hour  Intake 1599.1  ml  Output -  Net 1599.1 ml   There were no vitals filed for this visit.  Examination: General appearance:  adult female, alert and in no acute distress.   HEENT: Anicteric, conjunctiva pink, lids and lashes normal. No nasal deformity, discharge, epistaxis.  Lips moist.   Skin: Warm and dry.  no jaundice.  No suspicious rashes or lesions. Cardiac: RRR, nl S1-S2, no murmurs appreciated.  Capillary refill is brisk.  JVP normal.  No LE edema.  Radia  pulses 2+ and symmetric. Respiratory: Normal respiratory rate and rhythm.  CTAB without rales or wheezes. Abdomen: Abdomen soft.  No TTP.  No CVA tenderness. No ascites, distension, hepatosplenomegaly.   MSK: No deformities or effusions. Neuro: Awake and alert.  EOMI, moves all extremities. Speech fluent.    Psych: Sensorium intact and responding to questions, attention normal. Affect normal.  Judgment and insight appear normal.    Data Reviewed: I have personally reviewed following labs and imaging studies:  CBC: Recent Labs  Lab 12/26/18 0024 12/26/18 1828 12/27/18 0259 12/28/18 0230  WBC 12.8* 18.4* 15.4* 9.4  NEUTROABS  --  13.9*  --   --   HGB 10.6* 11.2* 9.3* 9.5*  HCT 35.0* 37.7 29.1* 30.8*  MCV 89.1 92.0 86.6 88.0  PLT 467* 415* 383 347   Basic Metabolic Panel: Recent Labs  Lab 12/26/18 0024 12/26/18 1828 12/27/18 0259 12/28/18 0230  NA 135 137 137 136  K 4.1 3.5 3.2* 3.9  CL 102 102 101  104  CO2 25 22 23 23   GLUCOSE 112* 90 101* 99  BUN <5* 8 6 <5*  CREATININE 0.80 0.84 0.92 0.88  CALCIUM 9.0 9.1 8.6* 8.4*   GFR: CrCl cannot be calculated (Unknown ideal weight.). Liver Function Tests: Recent Labs  Lab 12/26/18 0024 12/26/18 1828 12/27/18 0259  AST 17 25 26   ALT 10 12 12   ALKPHOS 78 73 63  BILITOT 0.1* 0.5 0.4  PROT 7.5 7.4 6.4*  ALBUMIN 3.7 3.8 2.9*   Recent Labs  Lab 12/26/18 0024 12/26/18 1828  LIPASE 23 19   No results for input(s): AMMONIA in the last 168 hours. Coagulation Profile: No  results for input(s): INR, PROTIME in the last 168 hours. Cardiac Enzymes: No results for input(s): CKTOTAL, CKMB, CKMBINDEX, TROPONINI in the last 168 hours. BNP (last 3 results) No results for input(s): PROBNP in the last 8760 hours. HbA1C: No results for input(s): HGBA1C in the last 72 hours. CBG: No results for input(s): GLUCAP in the last 168 hours. Lipid Profile: No results for input(s): CHOL, HDL, LDLCALC, TRIG, CHOLHDL, LDLDIRECT in the last 72 hours. Thyroid Function Tests: No results for input(s): TSH, T4TOTAL, FREET4, T3FREE, THYROIDAB in the last 72 hours. Anemia Panel: No results for input(s): VITAMINB12, FOLATE, FERRITIN, TIBC, IRON, RETICCTPCT in the last 72 hours. Urine analysis:    Component Value Date/Time   COLORURINE STRAW (A) 12/26/2018 1441   APPEARANCEUR CLEAR 12/26/2018 1441   LABSPEC 1.003 (L) 12/26/2018 1441   PHURINE 6.0 12/26/2018 1441   GLUCOSEU NEGATIVE 12/26/2018 1441   HGBUR SMALL (A) 12/26/2018 1441   BILIRUBINUR NEGATIVE 12/26/2018 1441   BILIRUBINUR small 11/27/2018 1114   KETONESUR NEGATIVE 12/26/2018 1441   PROTEINUR NEGATIVE 12/26/2018 1441   UROBILINOGEN 0.2 12/26/2018 1306   NITRITE NEGATIVE 12/26/2018 1441   LEUKOCYTESUR MODERATE (A) 12/26/2018 1441   Sepsis Labs: @LABRCNTIP (procalcitonin:4,lacticacidven:4)  ) Recent Results (from the past 240 hour(s))  Urine culture     Status: Abnormal (Preliminary result)   Collection Time: 12/26/18  2:41 PM   Specimen: Urine, Random  Result Value Ref Range Status   Specimen Description URINE, RANDOM  Final   Special Requests NONE  Final   Culture (A)  Final    >=100,000 COLONIES/mL ESCHERICHIA COLI SUSCEPTIBILITIES TO FOLLOW Performed at [facility], [address] [place], [place] [address]    Report Status PENDING  Incomplete  Wet prep, genital     Status: Abnormal   Collection Time: 12/26/18  7:23 PM   Specimen: Thin Prep Cervical/Endocervical  Result Value Ref Range Status    Yeast Wet Prep HPF POC NONE SEEN NONE SEEN Final   Trich, Wet Prep NONE SEEN NONE SEEN Final   Clue Cells Wet Prep HPF POC NONE SEEN NONE SEEN Final   WBC, Wet Prep HPF POC FEW (A) NONE SEEN Final   Sperm NONE SEEN  Final    Comment: Performed at [facility], [address] [place], [place] [address]  SARS Coronavirus 2 [facility](Hospital order, Performed in [facility] hospital lab) Nasopharyngeal Nasopharyngeal Swab     Status: None   Collection Time: 12/26/18 10:22 PM   Specimen: Nasopharyngeal Swab  Result Value Ref Range Status   SARS Coronavirus 2 NEGATIVE NEGATIVE Final    Comment: (NOTE) If result is NEGATIVE SARS-CoV-2 target nucleic acids are NOT DETECTED. The SARS-CoV-2 RNA is generally detectable in upper and lower  respiratory specimens during the acute phase of infection. The lowest  concentration  of SARS-CoV-2 viral copies this assay can detect is 250  copies / mL. A negative result does not preclude SARS-CoV-2 infection  and should not be used as the sole basis for treatment or other  patient management decisions.  A negative result may occur with  improper specimen collection / handling, submission of specimen other  than nasopharyngeal swab, presence of viral mutation(s) within the  areas targeted by this assay, and inadequate number of viral copies  (<250 copies / mL). A negative result must be combined with clinical  observations, patient history, and epidemiological information. If result is POSITIVE SARS-CoV-2 target nucleic acids are DETECTED. The SARS-CoV-2 RNA is generally detectable in upper and lower  respiratory specimens dur ing the acute phase of infection.  Positive  results are indicative of active infection with SARS-CoV-2.  Clinical  correlation with patient history and other diagnostic information is  necessary to determine patient infection status.  Positive results do  not rule out bacterial infection or co-infection with other viruses. If result  is PRESUMPTIVE POSTIVE SARS-CoV-2 nucleic acids MAY BE PRESENT.   A presumptive positive result was obtained on the submitted specimen  and confirmed on repeat testing.  While 2019 novel coronavirus  (SARS-CoV-2) nucleic acids may be present in the submitted sample  additional confirmatory testing may be necessary for epidemiological  and / or clinical management purposes  to differentiate between  SARS-CoV-2 and other Sarbecovirus currently known to infect humans.  If clinically indicated additional testing with an alternate test  methodology [phone]) is advised. The SARS-CoV-2 RNA is generally  detectable in upper and lower respiratory sp ecimens during the acute  phase of infection. The expected result is Negative. Fact Sheet for Patients:  StrictlyIdeas.no Fact Sheet for Healthcare Providers: BankingDealers.co.za This test is not yet approved or cleared by the [place] FDA and has been authorized for detection and/or diagnosis of SARS-CoV-2 by FDA under an Emergency Use Authorization (EUA).  This EUA will remain in effect (meaning this test can be used) for the duration of the COVID-19 declaration under Section 564(b)(1) of the Act, 21 U.S.C. section 360bbb-3(b)(1), unless the authorization is terminated or revoked sooner. Performed at [facility], [place] [address] [place], [place] [address]   Blood culture (routine x 2)     Status: None (Preliminary result)   Collection Time: 12/26/18 11:37 PM   Specimen: BLOOD RIGHT HAND  Result Value Ref Range Status   Specimen Description BLOOD RIGHT HAND  Final   Special Requests   Final    BOTTLES DRAWN AEROBIC ONLY Blood Culture results may not be optimal due to an excessive volume of blood received in culture bottles   Culture   Final    NO GROWTH 1 DAY Performed at [facility], [place] [address] [place], [place] [address]    Report Status PENDING  Incomplete  Blood  culture (routine x 2)     Status: None (Preliminary result)   Collection Time: 12/26/18 11:37 PM   Specimen: BLOOD  Result Value Ref Range Status   Specimen Description BLOOD RIGHT ARM  Final   Special Requests   Final    BOTTLES DRAWN AEROBIC AND ANAEROBIC Blood Culture results may not be optimal due to an excessive volume of blood received in culture bottles   Culture   Final    NO GROWTH 1 DAY Performed at [facility], [place] [address] [place], [place] [address]    Report Status PENDING  Incomplete         Radiology Studies: Dg Chest Port 1 View  Result Date: 12/26/2018 CLINICAL DATA:  Congestion and fevers EXAM: PORTABLE CHEST 1 VIEW COMPARISON:  11/01/2015 FINDINGS: The heart size and mediastinal contours are within normal limits. Both lungs are clear. The visualized skeletal structures are unremarkable. IMPRESSION: No active disease. Electronically Signed   By: Alcide CleverMark  Lukens M.D.   On: 12/26/2018 22:48        Scheduled Meds: . sodium chloride flush  3 mL Intravenous Once   Continuous Infusions: . cefTRIAXone (ROCEPHIN)  IV 1 g (12/28/18 1800)  . dextrose 5 % and 0.45% NaCl 75 mL/hr at 12/28/18 1757     LOS: 2 days    Time spent: 25 minutes    Alberteen Samhristopher P Faustine Tates, MD [facility] 12/28/2018, 7:00 PM     Please page through AMION:  [email or web address] Password [email or web address] If 7PM-7AM, please contact night-coverage

## 2018-12-28 NOTE — Progress Notes (Signed)
PROGRESS NOTE    Heather Lynch  ZOX:[id number] DOB: 04/25/1986 DOA: 12/26/2018 PCP: Patient, No Pcp Per   Brief Narrative:  Heather Lynch is a 33 y.o. F admitted 12/26/18 with reports of fever, flank pain, nausea and vomiting at home.  She had presented to the ER on 8/7 but left due to long wait times.  She also reported dysuria, urgency and foul smelling urine.  After returning home, she reported she felt worse.    In June she had a miscarriage at 19 weeks requiring D&C.  Since that time, she has had two menstrual cycles with LMP 1 week prior to admit. Her urine pregnancy screening was positive on presentation 8/8. Follow up screening negative for pregnancy with serum hCG <1.  The patient was febrile to 102.4 on admit with UA showing 11-20 WBC, rare bacteria, negative nitrite.  She was admitted for suspected pyelonephritis.  COVID-19 testing negative.  Empiric rocephin was initiated.  Urine culture grew e-coli with sensitivities pending.  The patient's course notable for ongoing intermittent fevers (to 103 8/9).          Assessment & Plan:  Pyelonephritis  -s/p GYN evaluation with evidence of PID in setting of recent miscarriage  P: Continue ceftriaxone, D3/10 abx  Follow up urine culture for final sensitivites If continues to have fevers, consider CT imaging to rule out calculi 8/11 PRN tylenol / ibuprofen pain / fever   Fever  -secondary to above  P: Follow fever curve / WBC trend  PRN antipyretics as above   Headache  P: PRN tylenol, ibuprofen for headache   Hypokalemia P: Monitor / replace as indicated   False Positive Pregnancy Test  P: Quant BHCG negative, no concern for pregnancy this admit Will need routine outpatient GYN follow up at discharge, missed her yearly visit  GYN planning discussion regarding contraception   Hx STD's (Gonorrhea) P: Follow up GC probe > ? If sent   Recent URI  -COVID negative on admit  P: Monitor / supportive care       .  sodium chloride flush  3 mL Intravenous Once   @CPDBMP @  Current Meds  Medication Sig  . vitamin C (ASCORBIC ACID) 500 MG tablet Take 500 mg by mouth daily.    DVT prophylaxis: ambulatory  Code Status: Full Code Family Communication: Patient updated on plan of care 8/10    Consultants:   GYN  Procedures:     Antimicrobials:   Ceftriaxone 8/8 >>     Subjective: Pt reports headache, tingling in bilateral hands, ongoing fevers to 103.    Objective: Vitals:   12/28/18 0018 12/28/18 0424 12/28/18 0548 12/28/18 1007  BP: 104/65 113/65  117/60  Pulse: 97 (!) 107  69  Resp: 16 16  17   Temp: 100.3 F (37.9 C) (!) 103 F (39.4 C) (!) 101.7 F (38.7 C) 97.9 F (36.6 C)  TempSrc: Oral Oral Oral Oral  SpO2: 96% 99%  100%    Intake/Output Summary (Last 24 hours) at 12/28/2018 1225 Last data filed at 12/28/2018 0900 Gross per 24 hour  Intake 120 ml  Output -  Net 120 ml   There were no vitals filed for this visit.  Examination: General appearance: young adult female, alert and in no acute distress.   HEENT: Anicteric, conjunctiva pink, lids and lashes normal. No nasal deformity, discharge, epistaxis.  Lips moist.   Skin: Warm and dry.  No jaundice.  No suspicious rashes or lesions. Cardiac: RRR, nl S1-S2,  no murmurs appreciated.  Capillary refill is brisk. No LE edema.  Radial pulses 2+ and symmetric. Respiratory: Normal respiratory rate and rhythm.  CTAB without rales or wheezes. Abdomen: Abdomen soft. No TTP. No ascites, distension, hepatosplenomegaly.   MSK: No deformities or effusions. Neuro: Awake and alert.  EOMI, moves all extremities. Speech fluent.    Psych: Sensorium intact and responding to questions, attention normal. Affect normal.  Judgment and insight appear normal.   Data Reviewed: I have personally reviewed following labs and imaging studies:  CBC: Recent Labs  Lab 12/26/18 0024 12/26/18 1828 12/27/18 0259 12/28/18 0230  WBC 12.8* 18.4*  15.4* 9.4  NEUTROABS  --  13.9*  --   --   HGB 10.6* 11.2* 9.3* 9.5*  HCT 35.0* 37.7 29.1* 30.8*  MCV 89.1 92.0 86.6 88.0  PLT 467* 415* 383 347   Basic Metabolic Panel: Recent Labs  Lab 12/26/18 0024 12/26/18 1828 12/27/18 0259 12/28/18 0230  NA 135 137 137 136  K 4.1 3.5 3.2* 3.9  CL 102 102 101 104  CO2 25 22 23 23   GLUCOSE 112* 90 101* 99  BUN <5* 8 6 <5*  CREATININE 0.80 0.84 0.92 0.88  CALCIUM 9.0 9.1 8.6* 8.4*   GFR: CrCl cannot be calculated (Unknown ideal weight.). Liver Function Tests: Recent Labs  Lab 12/26/18 0024 12/26/18 1828 12/27/18 0259  AST 17 25 26   ALT 10 12 12   ALKPHOS 78 73 63  BILITOT 0.1* 0.5 0.4  PROT 7.5 7.4 6.4*  ALBUMIN 3.7 3.8 2.9*   Recent Labs  Lab 12/26/18 0024 12/26/18 1828  LIPASE 23 19   No results for input(s): AMMONIA in the last 168 hours. Coagulation Profile: No results for input(s): INR, PROTIME in the last 168 hours. Cardiac Enzymes: No results for input(s): CKTOTAL, CKMB, CKMBINDEX, TROPONINI in the last 168 hours. BNP (last 3 results) No results for input(s): PROBNP in the last 8760 hours. HbA1C: No results for input(s): HGBA1C in the last 72 hours. CBG: No results for input(s): GLUCAP in the last 168 hours. Lipid Profile: No results for input(s): CHOL, HDL, LDLCALC, TRIG, CHOLHDL, LDLDIRECT in the last 72 hours. Thyroid Function Tests: No results for input(s): TSH, T4TOTAL, FREET4, T3FREE, THYROIDAB in the last 72 hours. Anemia Panel: No results for input(s): VITAMINB12, FOLATE, FERRITIN, TIBC, IRON, RETICCTPCT in the last 72 hours. Urine analysis:    Component Value Date/Time   COLORURINE STRAW (A) 12/26/2018 1441   APPEARANCEUR CLEAR 12/26/2018 1441   LABSPEC 1.003 (L) 12/26/2018 1441   PHURINE 6.0 12/26/2018 1441   GLUCOSEU NEGATIVE 12/26/2018 1441   HGBUR SMALL (A) 12/26/2018 1441   BILIRUBINUR NEGATIVE 12/26/2018 1441   BILIRUBINUR small 11/27/2018 1114   KETONESUR NEGATIVE 12/26/2018 1441    PROTEINUR NEGATIVE 12/26/2018 1441   UROBILINOGEN 0.2 12/26/2018 1306   NITRITE NEGATIVE 12/26/2018 1441   LEUKOCYTESUR MODERATE (A) 12/26/2018 1441   Sepsis Labs: @LABRCNTIP (procalcitonin:4,lacticacidven:4)  Recent Results (from the past 240 hour(s))  Urine culture     Status: Abnormal (Preliminary result)   Collection Time: 12/26/18  2:41 PM   Specimen: Urine, Random  Result Value Ref Range Status   Specimen Description URINE, RANDOM  Final   Special Requests NONE  Final   Culture (A)  Final    >=100,000 COLONIES/mL ESCHERICHIA COLI SUSCEPTIBILITIES TO FOLLOW Performed at [facility], [address] [place], [place] [address]    Report Status PENDING  Incomplete  Wet prep, genital     Status:  Abnormal   Collection Time: 12/26/18  7:23 PM   Specimen: Thin Prep Cervical/Endocervical  Result Value Ref Range Status   Yeast Wet Prep HPF POC NONE SEEN NONE SEEN Final   Trich, Wet Prep NONE SEEN NONE SEEN Final   Clue Cells Wet Prep HPF POC NONE SEEN NONE SEEN Final   WBC, Wet Prep HPF POC FEW (A) NONE SEEN Final   Sperm NONE SEEN  Final    Comment: Performed at [facility], [address] [place], [place] [address]  SARS Coronavirus 2 [facility](Hospital order, Performed in [facility] hospital lab) Nasopharyngeal Nasopharyngeal Swab     Status: None   Collection Time: 12/26/18 10:22 PM   Specimen: Nasopharyngeal Swab  Result Value Ref Range Status   SARS Coronavirus 2 NEGATIVE NEGATIVE Final    Comment: (NOTE) If result is NEGATIVE SARS-CoV-2 target nucleic acids are NOT DETECTED. The SARS-CoV-2 RNA is generally detectable in upper and lower  respiratory specimens during the acute phase of infection. The lowest  concentration of SARS-CoV-2 viral copies this assay can detect is 250  copies / mL. A negative result does not preclude SARS-CoV-2 infection  and should not be used as the sole basis for treatment or other  patient management decisions.  A negative result may  occur with  improper specimen collection / handling, submission of specimen other  than nasopharyngeal swab, presence of viral mutation(s) within the  areas targeted by this assay, and inadequate number of viral copies  (<250 copies / mL). A negative result must be combined with clinical  observations, patient history, and epidemiological information. If result is POSITIVE SARS-CoV-2 target nucleic acids are DETECTED. The SARS-CoV-2 RNA is generally detectable in upper and lower  respiratory specimens dur ing the acute phase of infection.  Positive  results are indicative of active infection with SARS-CoV-2.  Clinical  correlation with patient history and other diagnostic information is  necessary to determine patient infection status.  Positive results do  not rule out bacterial infection or co-infection with other viruses. If result is PRESUMPTIVE POSTIVE SARS-CoV-2 nucleic acids MAY BE PRESENT.   A presumptive positive result was obtained on the submitted specimen  and confirmed on repeat testing.  While 2019 novel coronavirus  (SARS-CoV-2) nucleic acids may be present in the submitted sample  additional confirmatory testing may be necessary for epidemiological  and / or clinical management purposes  to differentiate between  SARS-CoV-2 and other Sarbecovirus currently known to infect humans.  If clinically indicated additional testing with an alternate test  methodology [phone]([id number]) is advised. The SARS-CoV-2 RNA is generally  detectable in upper and lower respiratory sp ecimens during the acute  phase of infection. The expected result is Negative. Fact Sheet for Patients:  BoilerBrush.com.cyhttps://www.fda.gov/media/136312/download Fact Sheet for Healthcare Providers: https://pope.com/https://www.fda.gov/media/136313/download This test is not yet approved or cleared by the [place] FDA and has been authorized for detection and/or diagnosis of SARS-CoV-2 by FDA under an Emergency Use Authorization (EUA).  This  EUA will remain in effect (meaning this test can be used) for the duration of the COVID-19 declaration under Section 564(b)(1) of the Act, 21 U.S.C. section 360bbb-3(b)(1), unless the authorization is terminated or revoked sooner. Performed at [facility], [address] [place], [place] [address]   Blood culture (routine x 2)     Status: None (Preliminary result)   Collection Time: 12/26/18 11:37 PM   Specimen: BLOOD RIGHT HAND  Result Value Ref Range Status  Specimen Description BLOOD RIGHT HAND  Final   Special Requests   Final    BOTTLES DRAWN AEROBIC ONLY Blood Culture results may not be optimal due to an excessive volume of blood received in culture bottles   Culture   Final    NO GROWTH 1 DAY Performed at [facility], [place] [address], [place], [place] [address]    Report Status PENDING  Incomplete  Blood culture (routine x 2)     Status: None (Preliminary result)   Collection Time: 12/26/18 11:37 PM   Specimen: BLOOD  Result Value Ref Range Status   Specimen Description BLOOD RIGHT ARM  Final   Special Requests   Final    BOTTLES DRAWN AEROBIC AND ANAEROBIC Blood Culture results may not be optimal due to an excessive volume of blood received in culture bottles   Culture   Final    NO GROWTH 1 DAY Performed at [facility], [place] [address] [place], [place] [address]    Report Status PENDING  Incomplete     Radiology Studies: Dg Chest Port 1 View  Result Date: 12/26/2018 CLINICAL DATA:  Congestion and fevers EXAM: PORTABLE CHEST 1 VIEW COMPARISON:  11/01/2015 FINDINGS: The heart size and mediastinal contours are within normal limits. Both lungs are clear. The visualized skeletal structures are unremarkable. IMPRESSION: No active disease. Electronically Signed   By: Inez Catalina M.D.   On: 12/26/2018 22:48    Scheduled Meds: . sodium chloride flush  3 mL Intravenous Once   Continuous Infusions: . cefTRIAXone (ROCEPHIN)  IV 1 g (12/27/18 1727)  .  dextrose 5 % and 0.45% NaCl 75 mL/hr at 12/28/18 0617     LOS: 2 days    Time spent: 25 minutes  Noe Gens, [place]   Estimated body mass index is 48.87 kg/m as calculated from the following:   Height as of 02/09/14: 5\' 8"  (1.727 m).   Weight as of 11/27/18: 145.8 kg. Malnutrition Type:   Malnutrition Characteristics:   Nutrition Interventions:      . sodium chloride flush  3 mL Intravenous Once   . cefTRIAXone (ROCEPHIN)  IV 1 g (12/27/18 1727)  . dextrose 5 % and 0.45% NaCl 75 mL/hr at 12/28/18 0617    Current Meds  Medication Sig  . vitamin C (ASCORBIC ACID) 500 MG tablet Take 500 mg by mouth daily.   acetaminophen, ondansetron **OR** ondansetron (ZOFRAN) IV

## 2018-12-28 NOTE — Progress Notes (Signed)
Pt's temp at 0430 is 103, gave tylenol 650mg   And rechecked temp after an hour, its 101.7, text message to inform, awaiting reply.

## 2018-12-28 NOTE — TOC Initial Note (Signed)
Transition of Care [facility]) - Initial/Assessment Note    Patient Details  Name: Heather Lynch MRN: [id number] Date of Birth: [birth year]  Transition of Care [facility]) CM/SW Contact:    Marilu Favre, RN Phone Number: 12/28/2018, 12:37 PM  Clinical Narrative:                 Confirmed face sheet information. Financial Counselor has already seen patient. Patient from home with her 7 children ranging in age from 33 years old to 33 years old.   Changed pharmacy to [facility] with patient's permission. Will provide MATCH patient voiced understanding.   Patient would like to establish care at [facility] [address], [place] , [place] , [place] [address] phone [phone]. NCM called and left message, await call back,     Expected Discharge Plan: Home/Self Care Barriers to Discharge: Continued Medical Work up   Patient Goals and CMS Choice Patient states their goals for this hospitalization and ongoing recovery are:: to go home CMS Medicare.gov Compare Post Acute Care list provided to:: Patient Choice offered to / list presented to : NA  Expected Discharge Plan and Services Expected Discharge Plan: Home/Self Care In-house Referral: Financial Counselor, PCP / Health Connect Discharge Planning Services: CM Consult, Medication Assistance, [facility], Deputy arrangements for the past 2 months: Single Family Home Expected Discharge Date: 12/29/18               DME Arranged: N/A         HH Arranged: NA          Prior Living Arrangements/Services Living arrangements for the past 2 months: Single Family Home Lives with:: Minor Children Patient language and need for interpreter reviewed:: Yes Do you feel safe going back to the place where you live?: Yes      Need for Family Participation in Patient Care: No (Comment) Care giver support system in place?: No (comment)   Criminal Activity/Legal Involvement  Pertinent to Current Situation/Hospitalization: No - Comment as needed  Activities of Daily Living Home Assistive Devices/Equipment: None ADL Screening (condition at time of admission) Patient's cognitive ability adequate to safely complete daily activities?: Yes Is the patient deaf or have difficulty hearing?: No Does the patient have difficulty seeing, even when wearing glasses/contacts?: No Does the patient have difficulty concentrating, remembering, or making decisions?: No Patient able to express need for assistance with ADLs?: Yes Does the patient have difficulty dressing or bathing?: No Independently performs ADLs?: Yes (appropriate for developmental age) Does the patient have difficulty walking or climbing stairs?: No Weakness of Legs: None Weakness of Arms/Hands: None  Permission Sought/Granted   Permission granted to share information with : Yes, Verbal Permission Granted     Permission granted to share info w AGENCY: [place]        Emotional Assessment Appearance:: Appears stated age Attitude/Demeanor/Rapport: Apprehensive Affect (typically observed): Adaptable Orientation: : Oriented to Self, Oriented to Place, Oriented to  Time, Oriented to Situation Alcohol / Substance Use: Not Applicable Psych Involvement: No (comment)  Admission diagnosis:  Pyelonephritis [N12] Patient Active Problem List   Diagnosis Date Noted  . Leucocytosis 12/26/2018  . Acute pyelonephritis 12/26/2018   PCP:  Patient, No Pcp Per Pharmacy:   [id number] - [place], [place] [address] [place] [address] Phone: [phone] Fax: [phone]  [facility] [place], [place] - [address] [place] [address] Phone: [phone] Fax: [phone]     Social Determinants of Health (SDOH) Interventions    Readmission Risk  Interventions No flowsheet data found.

## 2018-12-28 NOTE — Plan of Care (Signed)
  Problem: Pain Managment: Goal: General experience of comfort will improve Outcome: Progressing   

## 2018-12-29 ENCOUNTER — Inpatient Hospital Stay (HOSPITAL_COMMUNITY): Payer: Medicaid Other

## 2018-12-29 DIAGNOSIS — A4150 Gram-negative sepsis, unspecified: Secondary | ICD-10-CM

## 2018-12-29 DIAGNOSIS — N390 Urinary tract infection, site not specified: Secondary | ICD-10-CM

## 2018-12-29 LAB — URINE CULTURE: Culture: >=100000 — AB

## 2018-12-29 LAB — BASIC METABOLIC PANEL
Anion gap: 9 (ref 5–15)
BUN: <5 mg/dL — ABNORMAL LOW (ref 6–20)
CO2: 24 mmol/L (ref 22–32)
Calcium: 8.4 mg/dL — ABNORMAL LOW (ref 8.9–10.3)
Chloride: 104 mmol/L (ref 98–111)
Creatinine, Ser: 0.68 mg/dL (ref 0.44–1.00)
GFR calc Af Amer: >60 mL/min (ref >60)
GFR calc non Af Amer: >60 mL/min (ref >60)
Glucose, Bld: 97 mg/dL (ref 70–99)
Potassium: 3.7 mmol/L (ref 3.5–5.1)
Sodium: 137 mmol/L (ref 135–145)

## 2018-12-29 LAB — CBC
HCT: 28.8 % — ABNORMAL LOW (ref 36.0–46.0)
Hemoglobin: 9 g/dL — ABNORMAL LOW (ref 12.0–15.0)
MCH: 27.4 pg (ref 26.0–34.0)
MCHC: 31.3 g/dL (ref 30.0–36.0)
MCV: 87.5 fL (ref 80.0–100.0)
Platelets: 320 10*3/uL (ref 150–400)
RBC: 3.29 MIL/uL — ABNORMAL LOW (ref 3.87–5.11)
RDW: 15.1 % (ref 11.5–15.5)
WBC: 6.3 10*3/uL (ref 4.0–10.5)
nRBC: 0 % (ref 0.0–0.2)

## 2018-12-29 LAB — GC/CHLAMYDIA PROBE AMP (~~LOC~~) NOT AT ARMC
Chlamydia: NEGATIVE
Neisseria Gonorrhea: NEGATIVE

## 2018-12-29 MED ORDER — CEPHALEXIN 500 MG PO CAPS
500.0000 mg | ORAL_CAPSULE | Freq: Two times a day (BID) | ORAL | Status: DC
  Administered 2018-12-29 – 2018-12-30 (×3): 500 mg via ORAL
  Filled 2018-12-29 (×3): qty 1

## 2018-12-29 MED ORDER — IOHEXOL 300 MG/ML  SOLN
100.0000 mL | Freq: Once | INTRAMUSCULAR | Status: AC | PRN
  Administered 2018-12-29: 100 mL via INTRAVENOUS

## 2018-12-29 NOTE — Progress Notes (Signed)
PROGRESS NOTE    [place]  KXF:[id number] DOB: [birth year] DOA: 12/26/2018 PCP: Patient, No Pcp Per      Brief Narrative:  Mrs. Heather Lynch is a 33 y.o. F with past medical history of obesity, recent spontaneous abortion, multiple admissions for urinary tract infections, depression presented to the emergency department on 12/26/2018 with complaints of left flank pain, dysuria, fever and chills.  In the emergency department patient was found to have fever of 102.4, elevated heart rate of 124, respiratory rate of 23, oxygen saturations 99% on room air.  Patient was also found to have elevated white blood cell count of 15,000 with a left shift.  Patient was started treatment has pyelonephritis.  Urine cultures were positive for E. coli pansensitive.  Patient was treated with Rocephin, changed to cephalexin 500 mg twice daily.  Patient will continue for 10 days.  Patient was complaining of abdominal pain, concerning the CT abdomen and pelvis was done did not show any acute abnormality.  Patient's fever trended down, has been afebrile in the last 48 hours.  WBC normalized.  Patient is tolerating the diet well.  Patient is felt stable to be discharged home with oral antibiotics.    Assessment & Plan:  ##Sepsis 2/2 pyelonephritis -Admitted with fever, leukocytosis and lactate 2.2. -Blood cultures negative to date -Urine cultures positive for E. coli pansensitive -Discontinue Rocephin, started the patient on cephalexin.  ##Left pyelonephritis -Concerning about ongoing abdominal pain, get CT abdomen and pelvis to rule out any underlying abscess -Patient has been afebrile, normal white blood cell count -Continue with the cephalexin  ##Urine pregnancy positive on admisison, follow up serum quant HCG negative.  Seen by OB-gyn, retained products ruled out. Doubt PID.  ##Headache Improved with ibuprofen overnight.   ##Anemia iron deficiency -Continue with the p.o.  iron  ##Hypokalemia -Normalized     MDM and disposition: The below labs and imaging reports were reviewed and summarized above.  Medication management as above.  The patient was admitted with sepsis from pyelonephritis.    She is stil fevering, still with pain.  Will continue IV antibiotics, if able to take PO, if pain improved, possibly home tomorrow      DVT prophylaxis: Lovenox Code Status: FULL Family Communication:     Consultants:   OB    Subjective: Feeling severe headache, very achy, weak, tired.  No confusion.  Still fever overnight.  Objective: Vitals:   12/28/18 1007 12/28/18 2044 12/29/18 0119 12/29/18 0459  BP: 117/60 125/88  102/71  Pulse: 69 72  78  Resp: 17 18  18   Temp: 97.9 F (36.6 C) 98.3 F (36.8 C) 99.1 F (37.3 C) 98.7 F (37.1 C)  TempSrc: Oral Oral Oral Oral  SpO2: 100% 100%  100%    Intake/Output Summary (Last 24 hours) at 12/29/2018 1747 Last data filed at 12/29/2018 0900 Gross per 24 hour  Intake 1392.29 ml  Output --  Net 1392.29 ml   There were no vitals filed for this visit.  Examination: General appearance:  adult female, alert and in no acute distress.   HEENT: Anicteric, conjunctiva pink, lids and lashes normal. No nasal deformity, discharge, epistaxis.  Lips moist.   Skin: Warm and dry.  no jaundice.  No suspicious rashes or lesions. Cardiac: RRR, nl S1-S2, no murmurs appreciated.  Capillary refill is brisk.  JVP normal.  No LE edema.  Radia  pulses 2+ and symmetric. Respiratory: Normal respiratory rate and rhythm.  CTAB without rales or wheezes. Abdomen: Abdomen  soft.  No TTP.  No CVA tenderness. No ascites, distension, hepatosplenomegaly.   MSK: No deformities or effusions. Neuro: Awake and alert.  EOMI, moves all extremities. Speech fluent.    Psych: Sensorium intact and responding to questions, attention normal. Affect normal.  Judgment and insight appear normal.    Data Reviewed: I have personally reviewed  following labs and imaging studies:  CBC: Recent Labs  Lab 12/26/18 0024 12/26/18 1828 12/27/18 0259 12/28/18 0230 12/29/18 0553  WBC 12.8* 18.4* 15.4* 9.4 6.3  NEUTROABS  --  13.9*  --   --   --   HGB 10.6* 11.2* 9.3* 9.5* 9.0*  HCT 35.0* 37.7 29.1* 30.8* 28.8*  MCV 89.1 92.0 86.6 88.0 87.5  PLT 467* 415* 383 347 320   Basic Metabolic Panel: Recent Labs  Lab 12/26/18 0024 12/26/18 1828 12/27/18 0259 12/28/18 0230 12/29/18 0553  NA 135 137 137 136 137  K 4.1 3.5 3.2* 3.9 3.7  CL 102 102 101 104 104  CO2 25 22 23 23 24   GLUCOSE 112* 90 101* 99 97  BUN <5* 8 6 <5* <5*  CREATININE 0.80 0.84 0.92 0.88 0.68  CALCIUM 9.0 9.1 8.6* 8.4* 8.4*   GFR: CrCl cannot be calculated (Unknown ideal weight.). Liver Function Tests: Recent Labs  Lab 12/26/18 0024 12/26/18 1828 12/27/18 0259  AST 17 25 26   ALT 10 12 12   ALKPHOS 78 73 63  BILITOT 0.1* 0.5 0.4  PROT 7.5 7.4 6.4*  ALBUMIN 3.7 3.8 2.9*   Recent Labs  Lab 12/26/18 0024 12/26/18 1828  LIPASE 23 19   No results for input(s): AMMONIA in the last 168 hours. Coagulation Profile: No results for input(s): INR, PROTIME in the last 168 hours. Cardiac Enzymes: No results for input(s): CKTOTAL, CKMB, CKMBINDEX, TROPONINI in the last 168 hours. BNP (last 3 results) No results for input(s): PROBNP in the last 8760 hours. HbA1C: No results for input(s): HGBA1C in the last 72 hours. CBG: No results for input(s): GLUCAP in the last 168 hours. Lipid Profile: No results for input(s): CHOL, HDL, LDLCALC, TRIG, CHOLHDL, LDLDIRECT in the last 72 hours. Thyroid Function Tests: No results for input(s): TSH, T4TOTAL, FREET4, T3FREE, THYROIDAB in the last 72 hours. Anemia Panel: No results for input(s): VITAMINB12, FOLATE, FERRITIN, TIBC, IRON, RETICCTPCT in the last 72 hours. Urine analysis:    Component Value Date/Time   COLORURINE STRAW (A) 12/26/2018 1441   APPEARANCEUR CLEAR 12/26/2018 1441   LABSPEC 1.003 (L)  12/26/2018 1441   PHURINE 6.0 12/26/2018 1441   GLUCOSEU NEGATIVE 12/26/2018 1441   HGBUR SMALL (A) 12/26/2018 1441   BILIRUBINUR NEGATIVE 12/26/2018 1441   BILIRUBINUR small 11/27/2018 1114   KETONESUR NEGATIVE 12/26/2018 1441   PROTEINUR NEGATIVE 12/26/2018 1441   UROBILINOGEN 0.2 12/26/2018 1306   NITRITE NEGATIVE 12/26/2018 1441   LEUKOCYTESUR MODERATE (A) 12/26/2018 1441   Sepsis Labs: @LABRCNTIP (procalcitonin:4,lacticacidven:4)  ) Recent Results (from the past 240 hour(s))  Urine culture     Status: Abnormal   Collection Time: 12/26/18  2:41 PM   Specimen: Urine, Random  Result Value Ref Range Status   Specimen Description URINE, RANDOM  Final   Special Requests   Final    NONE Performed at [facility], [address] [place], [place] [address]    Culture >=100,000 COLONIES/mL ESCHERICHIA COLI (A)  Final   Report Status 12/29/2018 FINAL  Final   Organism ID, Bacteria ESCHERICHIA COLI (A)  Final      Susceptibility  Escherichia coli - MIC*    AMPICILLIN <=2 SENSITIVE Sensitive     CEFAZOLIN <=4 SENSITIVE Sensitive     CEFTRIAXONE <=1 SENSITIVE Sensitive     CIPROFLOXACIN <=0.25 SENSITIVE Sensitive     GENTAMICIN <=1 SENSITIVE Sensitive     IMIPENEM <=0.25 SENSITIVE Sensitive     NITROFURANTOIN <=16 SENSITIVE Sensitive     TRIMETH/SULFA <=20 SENSITIVE Sensitive     AMPICILLIN/SULBACTAM <=2 SENSITIVE Sensitive     PIP/TAZO <=4 SENSITIVE Sensitive     Extended ESBL NEGATIVE Sensitive     * >=100,000 COLONIES/mL ESCHERICHIA COLI  Wet prep, genital     Status: Abnormal   Collection Time: 12/26/18  7:23 PM   Specimen: Thin Prep Cervical/Endocervical  Result Value Ref Range Status   Yeast Wet Prep HPF POC NONE SEEN NONE SEEN Final   Trich, Wet Prep NONE SEEN NONE SEEN Final   Clue Cells Wet Prep HPF POC NONE SEEN NONE SEEN Final   WBC, Wet Prep HPF POC FEW (A) NONE SEEN Final   Sperm NONE SEEN  Final    Comment: Performed at [facility], [address] [place], [place] [address]  SARS Coronavirus 2 [facility](Hospital order, Performed in [facility] hospital lab) Nasopharyngeal Nasopharyngeal Swab     Status: None   Collection Time: 12/26/18 10:22 PM   Specimen: Nasopharyngeal Swab  Result Value Ref Range Status   SARS Coronavirus 2 NEGATIVE NEGATIVE Final    Comment: (NOTE) If result is NEGATIVE SARS-CoV-2 target nucleic acids are NOT DETECTED. The SARS-CoV-2 RNA is generally detectable in upper and lower  respiratory specimens during the acute phase of infection. The lowest  concentration of SARS-CoV-2 viral copies this assay can detect is 250  copies / mL. A negative result does not preclude SARS-CoV-2 infection  and should not be used as the sole basis for treatment or other  patient management decisions.  A negative result may occur with  improper specimen collection / handling, submission of specimen other  than nasopharyngeal swab, presence of viral mutation(s) within the  areas targeted by this assay, and inadequate number of viral copies  (<250 copies / mL). A negative result must be combined with clinical  observations, patient history, and epidemiological information. If result is POSITIVE SARS-CoV-2 target nucleic acids are DETECTED. The SARS-CoV-2 RNA is generally detectable in upper and lower  respiratory specimens dur ing the acute phase of infection.  Positive  results are indicative of active infection with SARS-CoV-2.  Clinical  correlation with patient history and other diagnostic information is  necessary to determine patient infection status.  Positive results do  not rule out bacterial infection or co-infection with other viruses. If result is PRESUMPTIVE POSTIVE SARS-CoV-2 nucleic acids MAY BE PRESENT.   A presumptive positive result was obtained on the submitted specimen  and confirmed on repeat testing.  While 2019 novel coronavirus  (SARS-CoV-2) nucleic acids may be present in the submitted sample  additional  confirmatory testing may be necessary for epidemiological  and / or clinical management purposes  to differentiate between  SARS-CoV-2 and other Sarbecovirus currently known to infect humans.  If clinically indicated additional testing with an alternate test  methodology [phone]) is advised. The SARS-CoV-2 RNA is generally  detectable in upper and lower respiratory sp ecimens during the acute  phase of infection. The expected result is Negative. Fact Sheet for Patients:  BoilerBrush.com.cyhttps://www.fda.gov/media/136312/download Fact Sheet for Healthcare Providers: https://pope.com/https://www.fda.gov/media/136313/download This test is not yet approved or cleared by  the [employer] and has been authorized for detection and/or diagnosis of SARS-CoV-2 by FDA under an Emergency Use Authorization (EUA).  This EUA will remain in effect (meaning this test can be used) for the duration of the COVID-19 declaration under Section 564(b)(1) of the Act, 21 U.S.C. section 360bbb-3(b)(1), unless the authorization is terminated or revoked sooner. Performed at [facility], [address] [place], [place] [address]   Blood culture (routine x 2)     Status: None (Preliminary result)   Collection Time: 12/26/18 11:37 PM   Specimen: BLOOD RIGHT HAND  Result Value Ref Range Status   Specimen Description BLOOD RIGHT HAND  Final   Special Requests   Final    BOTTLES DRAWN AEROBIC ONLY Blood Culture results may not be optimal due to an excessive volume of blood received in culture bottles   Culture   Final    NO GROWTH 2 DAYS Performed at [facility], [address], [place], [place] [address]    Report Status PENDING  Incomplete  Blood culture (routine x 2)     Status: None (Preliminary result)   Collection Time: 12/26/18 11:37 PM   Specimen: BLOOD  Result Value Ref Range Status   Specimen Description BLOOD RIGHT ARM  Final   Special Requests   Final    BOTTLES DRAWN AEROBIC AND ANAEROBIC Blood Culture results  may not be optimal due to an excessive volume of blood received in culture bottles   Culture   Final    NO GROWTH 2 DAYS Performed at [facility], [address] [place], [place] [address]    Report Status PENDING  Incomplete         Radiology Studies: Ct Abdomen Pelvis W Contrast  Result Date: 12/29/2018 CLINICAL DATA:  33 year old female with history of lower abdominal pain for 1 week. EXAM: CT ABDOMEN AND PELVIS WITH CONTRAST TECHNIQUE: Multidetector CT imaging of the abdomen and pelvis was performed using the standard protocol following bolus administration of intravenous contrast. CONTRAST:  OMNIPAQUE IOHEXOL 300 MG/ML  SOLN COMPARISON:  Unremarkable. FINDINGS: Lower chest: No suspicious cystic or solid hepatic lesions. No intra or extrahepatic biliary ductal dilatation. Gallbladder is normal in appearance. Hepatobiliary: No pancreatic mass. No pancreatic ductal dilatation. No pancreatic or peripancreatic fluid collections or inflammatory changes. Pancreas: Unremarkable. Spleen: Bilateral kidneys and adrenal glands are normal in appearance. No hydroureteronephrosis. Urinary bladder is normal in appearance. Adrenals/Urinary Tract: Bilateral kidneys and adrenal glands are normal in appearance. No hydroureteronephrosis. Urinary bladder is normal in appearance. Stomach/Bowel: Normal appearance of the stomach. No pathologic dilatation of small bowel or colon. Normal appendix. Vascular/Lymphatic: No significant atherosclerotic disease, aneurysm or dissection noted in the abdominal or pelvic vasculature. No lymphadenopathy noted in the abdomen or pelvis. Reproductive: Uterus and ovaries are unremarkable in appearance. Other: No significant volume of ascites.  No pneumoperitoneum. Musculoskeletal: There are no aggressive appearing lytic or blastic lesions noted in the visualized portions of the skeleton. IMPRESSION: 1. No acute findings are noted in the abdomen or pelvis to account for the  patient's symptoms. 2. Normal appendix. Electronically Signed   By: Trudie Reed M.D.   On: 12/29/2018 17:14        Scheduled Meds:  cephALEXin  500 mg Oral Q12H   sodium chloride flush  3 mL Intravenous Once   Continuous Infusions:  dextrose 5 % and 0.45% NaCl 75 mL/hr at 12/29/18 0722     LOS: 3 days  Time spent: 25 minutes    Heather Shek, MD [facility] 12/29/2018, 7:00 PM     Please page through AMION:  [email or web address] Password [email or web address] If 7PM-7AM, please contact night-coverage

## 2018-12-29 NOTE — Plan of Care (Signed)
  Problem: Pain Managment: Goal: General experience of comfort will improve Outcome: Progressing   Problem: Safety: Goal: Ability to remain free from injury will improve Outcome: Progressing   Problem: Skin Integrity: Goal: Risk for impaired skin integrity will decrease Outcome: Progressing   

## 2018-12-30 ENCOUNTER — Encounter (HOSPITAL_COMMUNITY): Payer: Self-pay | Admitting: Internal Medicine

## 2018-12-30 MED ORDER — CEPHALEXIN 500 MG PO CAPS: 500.0000 mg | ORAL_CAPSULE | Freq: Two times a day (BID) | ORAL | 0 refills | Status: DC

## 2018-12-30 MED FILL — CEPHALEXIN 500 MG CAPSULE: 500 | 10 days supply | Qty: 20 | Fill #0

## 2018-12-30 NOTE — TOC Transition Note (Signed)
Transition of Care [facility]) - CM/SW Discharge Note   Patient Details  Name: Heather Lynch MRN: [id number] Date of Birth: [birth year]  Transition [facility]) CM/SW Contact:  Marilu Favre, RN Phone Number: 12/30/2018, 11:10 AM   Clinical Narrative:     Prescription for keflex sent to [facility], cost is $6. Patient states she has $6 and can pay. TOC aware  Final next level of care: Home/Self Care Barriers to Discharge: Continued Medical Work up   Patient Goals and CMS Choice Patient states their goals for this hospitalization and ongoing recovery are:: to go home CMS Medicare.gov Compare Post Acute Care list provided to:: Patient Choice offered to / list presented to : NA  Discharge Placement                       Discharge Plan and Services In-house Referral: Development worker, community, PCP / Health Connect Discharge Planning Services: CM Consult, Medication Assistance, [facility], [facility]            DME Arranged: N/A         HH Arranged: NA          Social Determinants of Health (SDOH) Interventions     Readmission Risk Interventions No flowsheet data found.

## 2018-12-30 NOTE — Discharge Summary (Signed)
Physician Discharge Summary  [facility] WGN:[id number] DOB: 05/06/1986 DOA: 12/26/2018  PCP: Patient, No Pcp Per  Admit date: 12/26/2018 Discharge date: 12/30/2018  Time spent: 40 minutes  Recommendations for Outpatient Follow-up:  1. Follow-up with primary care physician in 1 week  Discharge Diagnoses:  Principal Problem:   Acute pyelonephritis Active Problems:   Leucocytosis   Discharge Condition: Stable  Diet recommendation: Low-fat  There were no vitals filed for this visit.  History of present illness and Hospital Course:  Heather Lynch is a 33 y.o. F with past medical history of obesity, recent spontaneous abortion, multiple admissions for urinary tract infections, depression presented to the emergency department on 12/26/2018 with complaints of left flank pain, dysuria, fever and chills.  In the emergency department patient was found to have fever of 102.4, elevated heart rate of 124, respiratory rate of 23, oxygen saturations 99% on room air.  Patient was also found to have elevated white blood cell count of 15,000 with a left shift.  Patient was started treatment has pyelonephritis.  Urine cultures were positive for E. coli pansensitive.  Patient was treated with Rocephin, changed to cephalexin 500 mg twice daily.  Patient will continue for 10 days.  Patient was complaining of abdominal pain, concerning the CT abdomen and pelvis was done did not show any acute abnormality.  Patient's fever trended down, has been afebrile in the last 48 hours.  WBC normalized.  Patient is tolerating the diet well.  Patient is felt stable to be discharged home with oral antibiotics.    Assessment & Plan:  ##Sepsis 2/2 pyelonephritis -Admitted with fever, leukocytosis and lactate 2.2. -Blood cultures negative to date -Urine cultures positive for E. coli pansensitive -Discontinue Rocephin, started the patient on cephalexin.  ##Left pyelonephritis -Concerning about ongoing abdominal  pain, get CT abdomen and pelvis to rule out any underlying abscess -Patient has been afebrile, normal white blood cell count -Continue with the cephalexin  ##Urine pregnancy positive on admisison, follow up serum quant HCG negative.  Seen by OB-gyn, retained products ruled out. Doubt PID.  ##Headache Improved with ibuprofen overnight.   ##Anemia iron deficiency -Continue with the p.o. iron  ##Hypokalemia -Normalized  Procedures:  None  Consultations: Obstetrics Discharge Exam: Vitals:   12/29/18 2039 12/30/18 0347  BP: 109/61 100/67  Pulse: 68 72  Resp: 16 16  Temp: 98.4 F (36.9 C) 98.8 F (37.1 C)  SpO2: 100% 100%    General appearance:  adult female, alert and in no acute distress.   HEENT: Anicteric, conjunctiva pink, lids and lashes normal. No nasal deformity, discharge, epistaxis.  Lips moist.   Skin: Warm and dry.  no jaundice.  No suspicious rashes or lesions. Cardiac: RRR, nl S1-S2, no murmurs appreciated.  Capillary refill is brisk.  JVP normal.  No LE edema.  Radia  pulses 2+ and symmetric. Respiratory: Normal respiratory rate and rhythm.  CTAB without rales or wheezes. Abdomen: Abdomen soft.  No TTP.  No CVA tenderness. No ascites, distension, hepatosplenomegaly.   MSK: No deformities or effusions. Neuro: Awake and alert.  EOMI, moves all extremities. Speech fluent.    Psych: Sensorium intact and responding to questions, attention normal. Affect normal.  Judgment and insight appear normal.  Discharge Instructions   Discharge Instructions    Diet - low sodium heart healthy   Complete by: As directed    Increase activity slowly   Complete by: As directed      Allergies as of 12/30/2018   No Known  Allergies     Medication List    STOP taking these medications   ibuprofen 600 MG tablet Commonly known as: ADVIL     TAKE these medications   cephALEXin 500 MG capsule Commonly known as: KEFLEX Take 1 capsule (500 mg total) by mouth 2 (two)  times daily.   docusate sodium 100 MG capsule Commonly known as: COLACE Take 1 capsule (100 mg total) by mouth 2 (two) times daily.   ferrous sulfate 325 (65 FE) MG tablet Commonly known as: FerrouSul Take 1 tablet (325 mg total) by mouth 2 (two) times daily.   Integra F 125-1 MG Caps Take 1 capsule by mouth daily.   prenatal multivitamin Tabs tablet Take 1 tablet by mouth daily at 12 noon.   vitamin C 500 MG tablet Commonly known as: ASCORBIC ACID Take 500 mg by mouth daily.      No Known Allergies Follow-up Information    [facility]. Schedule an appointment as soon as possible for a visit.   Contact information: [address] [facility] [place] [phone]           The results of significant diagnostics from this hospitalization (including imaging, microbiology, ancillary and laboratory) are listed below for reference.    Significant Diagnostic Studies: Ct Abdomen Pelvis W Contrast  Result Date: 12/29/2018 CLINICAL DATA:  33 year old female with history of lower abdominal pain for 1 week. EXAM: CT ABDOMEN AND PELVIS WITH CONTRAST TECHNIQUE: Multidetector CT imaging of the abdomen and pelvis was performed using the standard protocol following bolus administration of intravenous contrast. CONTRAST:  100mL OMNIPAQUE IOHEXOL 300 MG/ML  SOLN COMPARISON:  Unremarkable. FINDINGS: Lower chest: No suspicious cystic or solid hepatic lesions. No intra or extrahepatic biliary ductal dilatation. Gallbladder is normal in appearance. Hepatobiliary: No pancreatic mass. No pancreatic ductal dilatation. No pancreatic or peripancreatic fluid collections or inflammatory changes. Pancreas: Unremarkable. Spleen: Bilateral kidneys and adrenal glands are normal in appearance. No hydroureteronephrosis. Urinary bladder is normal in appearance. Adrenals/Urinary Tract: Bilateral kidneys and adrenal glands are normal in appearance. No  hydroureteronephrosis. Urinary bladder is normal in appearance. Stomach/Bowel: Normal appearance of the stomach. No pathologic dilatation of small bowel or colon. Normal appendix. Vascular/Lymphatic: No significant atherosclerotic disease, aneurysm or dissection noted in the abdominal or pelvic vasculature. No lymphadenopathy noted in the abdomen or pelvis. Reproductive: Uterus and ovaries are unremarkable in appearance. Other: No significant volume of ascites.  No pneumoperitoneum. Musculoskeletal: There are no aggressive appearing lytic or blastic lesions noted in the visualized portions of the skeleton. IMPRESSION: 1. No acute findings are noted in the abdomen or pelvis to account for the patient's symptoms. 2. Normal appendix. Electronically Signed   By: Trudie Reedaniel  Entrikin M.D.   On: 12/29/2018 17:14   Dg Chest Port 1 View  Result Date: 12/26/2018 CLINICAL DATA:  Congestion and fevers EXAM: PORTABLE CHEST 1 VIEW COMPARISON:  11/01/2015 FINDINGS: The heart size and mediastinal contours are within normal limits. Both lungs are clear. The visualized skeletal structures are unremarkable. IMPRESSION: No active disease. Electronically Signed   By: Alcide CleverMark  Lukens M.D.   On: 12/26/2018 22:48    Microbiology: Recent Results (from the past 240 hour(s))  Urine culture     Status: Abnormal   Collection Time: 12/26/18  2:41 PM   Specimen: Urine, Random  Result Value Ref Range Status   Specimen Description URINE, RANDOM  Final   Special Requests   Final    NONE Performed at  [facility], [address] [place], [place] [address]    Culture >=100,000 COLONIES/mL ESCHERICHIA COLI (A)  Final   Report Status 12/29/2018 FINAL  Final   Organism ID, Bacteria ESCHERICHIA COLI (A)  Final      Susceptibility   Escherichia coli - MIC*    AMPICILLIN <=2 SENSITIVE Sensitive     CEFAZOLIN <=4 SENSITIVE Sensitive     CEFTRIAXONE <=1 SENSITIVE Sensitive     CIPROFLOXACIN <=0.25 SENSITIVE Sensitive     GENTAMICIN  <=1 SENSITIVE Sensitive     IMIPENEM <=0.25 SENSITIVE Sensitive     NITROFURANTOIN <=16 SENSITIVE Sensitive     TRIMETH/SULFA <=20 SENSITIVE Sensitive     AMPICILLIN/SULBACTAM <=2 SENSITIVE Sensitive     PIP/TAZO <=4 SENSITIVE Sensitive     Extended ESBL NEGATIVE Sensitive     * >=100,000 COLONIES/mL ESCHERICHIA COLI  Wet prep, genital     Status: Abnormal   Collection Time: 12/26/18  7:23 PM   Specimen: Thin Prep Cervical/Endocervical  Result Value Ref Range Status   Yeast Wet Prep HPF POC NONE SEEN NONE SEEN Final   Trich, Wet Prep NONE SEEN NONE SEEN Final   Clue Cells Wet Prep HPF POC NONE SEEN NONE SEEN Final   WBC, Wet Prep HPF POC FEW (A) NONE SEEN Final   Sperm NONE SEEN  Final    Comment: Performed at [facility], [address] [place], [place] [address]  SARS Coronavirus 2 [facility](Hospital order, Performed in [facility]) Nasopharyngeal Nasopharyngeal Swab     Status: None   Collection Time: 12/26/18 10:22 PM   Specimen: Nasopharyngeal Swab  Result Value Ref Range Status   SARS Coronavirus 2 NEGATIVE NEGATIVE Final    Comment: (NOTE) If result is NEGATIVE SARS-CoV-2 target nucleic acids are NOT DETECTED. The SARS-CoV-2 RNA is generally detectable in upper and lower  respiratory specimens during the acute phase of infection. The lowest  concentration of SARS-CoV-2 viral copies this assay can detect is 250  copies / mL. A negative result does not preclude SARS-CoV-2 infection  and should not be used as the sole basis for treatment or other  patient management decisions.  A negative result may occur with  improper specimen collection / handling, submission of specimen other  than nasopharyngeal swab, presence of viral mutation(s) within the  areas targeted by this assay, and inadequate number of viral copies  (<250 copies / mL). A negative result must be combined with clinical  observations, patient history, and epidemiological information. If result is  POSITIVE SARS-CoV-2 target nucleic acids are DETECTED. The SARS-CoV-2 RNA is generally detectable in upper and lower  respiratory specimens dur ing the acute phase of infection.  Positive  results are indicative of active infection with SARS-CoV-2.  Clinical  correlation with patient history and other diagnostic information is  necessary to determine patient infection status.  Positive results do  not rule out bacterial infection or co-infection with other viruses. If result is PRESUMPTIVE POSTIVE SARS-CoV-2 nucleic acids MAY BE PRESENT.   A presumptive positive result was obtained on the submitted specimen  and confirmed on repeat testing.  While 2019 novel coronavirus  (SARS-CoV-2) nucleic acids may be present in the submitted sample  additional confirmatory testing may be necessary for epidemiological  and / or clinical management purposes  to differentiate between  SARS-CoV-2 and other Sarbecovirus currently known to infect humans.  If clinically indicated additional testing with an alternate test  methodology [phone]([id number]) is  advised. The SARS-CoV-2 RNA is generally  detectable in upper and lower respiratory sp ecimens during the acute  phase of infection. The expected result is Negative. Fact Sheet for Patients:  StrictlyIdeas.no Fact Sheet for Healthcare Providers: BankingDealers.co.za This test is not yet approved or cleared by the [place] FDA and has been authorized for detection and/or diagnosis of SARS-CoV-2 by FDA under an Emergency Use Authorization (EUA).  This EUA will remain in effect (meaning this test can be used) for the duration of the COVID-19 declaration under Section 564(b)(1) of the Act, 21 U.S.C. section 360bbb-3(b)(1), unless the authorization is terminated or revoked sooner. Performed at [facility], [place] [address], [place], [place] [address]   Blood culture (routine x 2)     Status: None  (Preliminary result)   Collection Time: 12/26/18 11:37 PM   Specimen: BLOOD RIGHT HAND  Result Value Ref Range Status   Specimen Description BLOOD RIGHT HAND  Final   Special Requests   Final    BOTTLES DRAWN AEROBIC ONLY Blood Culture results may not be optimal due to an excessive volume of blood received in culture bottles   Culture   Final    NO GROWTH 3 DAYS Performed at [facility], [place] [address], [place], [place] [address]    Report Status PENDING  Incomplete  Blood culture (routine x 2)     Status: None (Preliminary result)   Collection Time: 12/26/18 11:37 PM   Specimen: BLOOD  Result Value Ref Range Status   Specimen Description BLOOD RIGHT ARM  Final   Special Requests   Final    BOTTLES DRAWN AEROBIC AND ANAEROBIC Blood Culture results may not be optimal due to an excessive volume of blood received in culture bottles   Culture   Final    NO GROWTH 3 DAYS Performed at [facility], [place] [address], [place], [place] [address]    Report Status PENDING  Incomplete     Labs: Basic Metabolic Panel: Recent Labs  Lab 12/26/18 0024 12/26/18 1828 12/27/18 0259 12/28/18 0230 12/29/18 0553  NA 135 137 137 136 137  K 4.1 3.5 3.2* 3.9 3.7  CL 102 102 101 104 104  CO2 25 22 23 23 24   GLUCOSE 112* 90 101* 99 97  BUN <5* 8 6 <5* <5*  CREATININE 0.80 0.84 0.92 0.88 0.68  CALCIUM 9.0 9.1 8.6* 8.4* 8.4*   Liver Function Tests: Recent Labs  Lab 12/26/18 0024 12/26/18 1828 12/27/18 0259  AST 17 25 26   ALT 10 12 12   ALKPHOS 78 73 63  BILITOT 0.1* 0.5 0.4  PROT 7.5 7.4 6.4*  ALBUMIN 3.7 3.8 2.9*   Recent Labs  Lab 12/26/18 0024 12/26/18 1828  LIPASE 23 19   No results for input(s): AMMONIA in the last 168 hours. CBC: Recent Labs  Lab 12/26/18 0024 12/26/18 1828 12/27/18 0259 12/28/18 0230 12/29/18 0553  WBC 12.8* 18.4* 15.4* 9.4 6.3  NEUTROABS  --  13.9*  --   --   --   HGB 10.6* 11.2* 9.3* 9.5* 9.0*  HCT 35.0* 37.7 29.1* 30.8* 28.8*  MCV  89.1 92.0 86.6 88.0 87.5  PLT 467* 415* 383 347 320   Cardiac Enzymes: No results for input(s): CKTOTAL, CKMB, CKMBINDEX, TROPONINI in the last 168 hours. BNP: BNP (last 3 results) No results for input(s): BNP in the last 8760 hours.  ProBNP (last 3 results) No results for input(s): PROBNP in the last 8760 hours.  CBG: No results for input(s): GLUCAP in the last 168 hours.     SignedSusa Griffins MD.  [facility] 12/30/2018, 2:52 PM

## 2019-01-01 LAB — CULTURE, BLOOD (ROUTINE X 2)
Culture: NO GROWTH
Culture: NO GROWTH

## 2019-02-16 ENCOUNTER — Ambulatory Visit (HOSPITAL_COMMUNITY)
Admission: EM | Admit: 2019-02-16 | Discharge: 2019-02-16 | Disposition: A | Discharge status: Home / Self Care | Payer: Medicaid Other | Attending: Family Medicine | Admitting: Family Medicine

## 2019-02-16 ENCOUNTER — Encounter (HOSPITAL_COMMUNITY): Payer: Self-pay | Admitting: Emergency Medicine

## 2019-02-16 ENCOUNTER — Other Ambulatory Visit: Payer: Self-pay

## 2019-02-16 DIAGNOSIS — R109 Unspecified abdominal pain: Secondary | ICD-10-CM

## 2019-02-16 DIAGNOSIS — Z3202 Encounter for pregnancy test, result negative: Secondary | ICD-10-CM

## 2019-02-16 DIAGNOSIS — F419 Anxiety disorder, unspecified: Secondary | ICD-10-CM | POA: Diagnosis not present

## 2019-02-16 DIAGNOSIS — R002 Palpitations: Secondary | ICD-10-CM | POA: Diagnosis not present

## 2019-02-16 DIAGNOSIS — M79632 Pain in left forearm: Secondary | ICD-10-CM | POA: Diagnosis not present

## 2019-02-16 DIAGNOSIS — M79652 Pain in left thigh: Secondary | ICD-10-CM

## 2019-02-16 LAB — POCT URINALYSIS DIP (DEVICE)
Bilirubin Urine: NEGATIVE
Glucose, UA: NEGATIVE mg/dL
Ketones, ur: NEGATIVE mg/dL
Leukocytes,Ua: NEGATIVE
Nitrite: NEGATIVE
Protein, ur: NEGATIVE mg/dL
Specific Gravity, Urine: 1.025 (ref 1.005–1.030)
Urobilinogen, UA: 0.2 mg/dL (ref 0.0–1.0)
pH: 6 (ref 5.0–8.0)

## 2019-02-16 LAB — POCT PREGNANCY, URINE: Preg Test, Ur: NEGATIVE

## 2019-02-16 MED ORDER — HYDROXYZINE HCL 25 MG PO TABS: 25.0000 mg | ORAL_TABLET | Freq: Four times a day (QID) | ORAL | 0 refills | Status: DC | PRN

## 2019-02-16 MED ORDER — NAPROXEN 500 MG PO TABS: 500.0000 mg | ORAL_TABLET | Freq: Two times a day (BID) | ORAL | 0 refills | Status: DC

## 2019-02-16 NOTE — Discharge Instructions (Signed)
Urine was normal  Please work on incorporating more activity into your daily routine.  Slowly increase your exertion level. Please use Naprosyn twice daily with food to help with arm/leg pain over the next 1 to 2 weeks. Please follow-up if pain worsening, changing, developing any redness, warmth, weakness, numbness or tingling, swelling  You may also try using hydroxyzine as needed for any anxiety.  This may cause some drowsiness, do not drive or work after taking.  If you continue to have sensations of heart fluttering/skipping please follow-up with cardiology for further evaluation. Please return here or emergency room if ever developing any chest pain or shortness of breath, lightheadedness or dizziness

## 2019-02-16 NOTE — ED Provider Notes (Signed)
[name]    CSN: [id number] Arrival date & time: 02/16/19  1922      History   Chief Complaint Chief Complaint  Patient presents with  . Arm Pain  . Leg Pain    HPI Heather Lynch is a 33 y.o. female history of depression, obesity, presenting today for evaluation of left arm and left leg pain.  Patient states that over the past day she has developed discomfort around her left elbow, pain is in her forearm now.  She is also had some migratory pain within her leg.  Feels as if it is a deep ache.  Denies any specific injury fall or trauma.  She notes that yesterday she was lying on her left side while she was on the computer.  She is unsure exactly how long she was in this position.  She denies history of similar.  Denies numbness or tingling.  Has discomfort with walking as well as moving her arm.  She also notes that she is concerned that she will have some occasional back and abdominal discomfort since she had her miscarriage in June.  States that this will come and go.  At time of visit discomfort very minimal.  She states last week she had some dysuria and this week has noticed her urine is very dark in color.  She was recently admitted for pyelonephritis in early August.  She notes that over the past several months since [place] pandemic began she has had a relatively sedentary lifestyle.  When lying flat she will have some occasional sensations of shortness of breath.  She reports occasional palpitations and increase in her anxiety.  She does not take anything for anxiety.  She has not taken anything for her pain.  HPI  Past Medical History:  Diagnosis Date  . Anemia   . Depression    refuses to take meds  . Gonorrhea   . Heart murmur   . Pneumonia   . Preterm delivery 10/20/2018  . Spontaneous abortion 10/20/2018  . Urinary tract infection     Patient Active Problem List   Diagnosis Date Noted  . Leucocytosis 12/26/2018  . Acute pyelonephritis 12/26/2018  .  Sepsis due to gram-negative UTI ([place]) 12/26/2018    Past Surgical History:  Procedure Laterality Date  . CESAREAN SECTION     X 5  . DILATION AND CURETTAGE OF UTERUS N/A 10/20/2018   Procedure: DILATATION AND CURETTAGE;  Surgeon: Woodroe Mode, MD;  Location: [facility];  Service: Gynecology;  Laterality: N/A;    OB History    Gravida  8   Para  8   Term  6   Preterm  1   AB      Living  7     SAB      TAB      Ectopic      Multiple  0   Live Births  7            Home Medications    Prior to Admission medications   Medication Sig Start Date End Date Taking? Authorizing Provider  hydrOXYzine (ATARAX/VISTARIL) 25 MG tablet Take 1 tablet (25 mg total) by mouth every 6 (six) hours as needed for anxiety. 02/16/19   Heather Lynch C, PA-C  naproxen (NAPROSYN) 500 MG tablet Take 1 tablet (500 mg total) by mouth 2 (two) times daily. 02/16/19   Heather Lynch C, PA-C  Prenatal Vit-Fe Fumarate-FA (PRENATAL MULTIVITAMIN) TABS tablet Take 1 tablet by  mouth daily at 12 noon.    [provider]  vitamin C (ASCORBIC ACID) 500 MG tablet Take 500 mg by mouth daily.    [provider]  ferrous sulfate (FERROUSUL) 325 (65 FE) MG tablet Take 1 tablet (325 mg total) by mouth 2 (two) times daily. Patient not taking: Reported on 11/27/2018 10/21/18 02/16/19  Constant, Peggy, MD    Family History Family History  Problem Relation Age of Onset  . Hypertension Other   . Other Neg Hx     Social History Social History   Tobacco Use  . Smoking status: Former Smoker    Quit date: 12/30/2011    Years since quitting: 7.1  . Smokeless tobacco: Never Used  Substance Use Topics  . Alcohol use: Yes    Comment: occasional  . Drug use: Yes    Types: Marijuana    Comment: occassional     Allergies   Patient has no known allergies.   Review of Systems Review of Systems  Constitutional: Negative for fatigue and fever.  HENT: Negative for congestion, sinus  pressure and sore throat.   Eyes: Negative for photophobia, pain and visual disturbance.  Respiratory: Negative for cough and shortness of breath.   Cardiovascular: Positive for palpitations. Negative for chest pain.  Gastrointestinal: Positive for abdominal pain. Negative for constipation, nausea and vomiting.  Genitourinary: Negative for decreased urine volume, difficulty urinating and hematuria.  Musculoskeletal: Positive for arthralgias, back pain, gait problem and myalgias. Negative for neck pain and neck stiffness.  Neurological: Negative for dizziness, syncope, facial asymmetry, speech difficulty, weakness, light-headedness, numbness and headaches.     Physical Exam Triage Vital Signs ED Triage Vitals  Enc Vitals Group     BP 02/16/19 1935 (!) 149/90     Pulse Rate 02/16/19 1935 100     Resp 02/16/19 1935 18     Temp 02/16/19 1935 97.6 F (36.4 C)     Temp Source 02/16/19 1935 Temporal     SpO2 02/16/19 1935 99 %     Weight --      Height --      Head Circumference --      Peak Flow --      Pain Score 02/16/19 1937 7     Pain Loc --      Pain Edu? --      Excl. in GC? --    No data found.  Updated Vital Signs BP (!) 149/90 (BP Location: Right Arm)   Pulse 100   Temp 97.6 F (36.4 C) (Temporal)   Resp 18   SpO2 99%   Visual Acuity Right Eye Distance:   Left Eye Distance:   Bilateral Distance:    Right Eye Near:   Left Eye Near:    Bilateral Near:     Physical Exam Vitals signs and nursing note reviewed.  Constitutional:      General: She is not in acute distress.    Appearance: She is well-developed. She is obese.  HENT:     Head: Normocephalic and atraumatic.  Eyes:     Conjunctiva/sclera: Conjunctivae normal.  Neck:     Musculoskeletal: Neck supple.  Cardiovascular:     Rate and Rhythm: Normal rate and regular rhythm.     Heart sounds: No murmur.  Pulmonary:     Effort: Pulmonary effort is normal. No respiratory distress.     Breath sounds:  Normal breath sounds.     Comments: Breathing comfortably at rest, CTABL, no  wheezing, rales or other adventitious sounds auscultated Abdominal:     Palpations: Abdomen is soft.     Tenderness: There is no abdominal tenderness.     Comments: Soft, nondistended, nontender to light and deep palpation throughout entire abdomen  Musculoskeletal:     Comments: Left elbow: Full active range of motion of the elbow, nontender to palpation over bony prominences of elbow, mild discomfort to proximal forearm along proximal radius.  No overlying discoloration.  Strength at shoulder 5/5 and equal bilaterally, grip strength 5/5 and equal bilaterally, radial pulse 2+ bilaterally  Nontender to palpation diffusely throughout left hip and proximal leg, full active range of motion of knee and hip.  Strength 5/5 and equal bilaterally at hips and knees, patellar reflex 2+ bilaterally, gait without abnormality  Skin:    General: Skin is warm and dry.  Neurological:     General: No focal deficit present.     Mental Status: She is alert and oriented to person, place, and time. Mental status is at baseline.      UC Treatments / Results  Labs (all labs ordered are listed, but only abnormal results are displayed) Labs Reviewed  POCT URINALYSIS DIP (DEVICE) - Abnormal; Notable for the following components:      Result Value   Hgb urine dipstick SMALL (*)    All other components within normal limits  POC URINE PREG, ED  POCT PREGNANCY, URINE    EKG   Radiology No results found.  Procedures Procedures (including critical care time)  Medications Ordered in UC Medications - No data to display  Initial Impression / Assessment and Plan / UC Course  I have reviewed the triage vital signs and the nursing notes.  Pertinent labs & imaging results that were available during my care of the patient were reviewed by me and considered in my medical decision making (see chart for details).   UA unremarkable,  small hemoglobin, on tail end of menstrual cycle.  Arm and leg pain most likely inflammatory, do not suspect underlying acute bony abnormality, deferring imaging, lacking mechanism of injury.  Likely related to position of lying yesterday.  Full active range of motion.  No numbness or tingling.  No chest pain.  Do not suspect cardiac etiology as source of arm pain.  Negative risk factors including denying hypertension, diabetes tobacco use.  Denies previous DVT/PE.  Denies travel or immobilization.  Will do trial of anti-inflammatories for elbow and leg pain.  Will provide Naprosyn twice daily.  Patient also does seem to have slight increase in anxiety, will provide hydroxyzine to use as needed.  Given occasional symptoms of palpitations recommending outpatient follow-up with cardiology for possible Holter monitor. BP elevated today, similar on repeat checks.  Patient and her mom also concerned for cardiac risks.  Recommending follow-up with PCP for further evaluation of this.  Do not feel current symptoms of forearm pain are likely cardiac etiology.  If developing any chest pain, radiation into arm, numbness or tingling recommending follow-up in emergency room.  Also discussed with mom cannot rule out stroke, but no neuro deficits, strength intact, reflexes intact, do not suspect CVA at this time, but if developing any of the symptoms to follow-up in emergency room.  Discussed strict return precautions. Patient verbalized understanding and is agreeable with plan.  Final Clinical Impressions(s) / UC Diagnoses   Final diagnoses:  Left forearm pain  Left thigh pain  Palpitations     Discharge Instructions     Urine  was normal  Please work on incorporating more activity into your daily routine.  Slowly increase your exertion level. Please use Naprosyn twice daily with food to help with arm/leg pain over the next 1 to 2 weeks. Please follow-up if pain worsening, changing, developing any redness,  warmth, weakness, numbness or tingling, swelling  You may also try using hydroxyzine as needed for any anxiety.  This may cause some drowsiness, do not drive or work after taking.  If you continue to have sensations of heart fluttering/skipping please follow-up with cardiology for further evaluation. Please return here or emergency room if ever developing any chest pain or shortness of breath, lightheadedness or dizziness    ED Prescriptions    Medication Sig Dispense Auth. Provider   naproxen (NAPROSYN) 500 MG tablet Take 1 tablet (500 mg total) by mouth 2 (two) times daily. 30 tablet Wojciech Willetts C, PA-C   hydrOXYzine (ATARAX/VISTARIL) 25 MG tablet Take 1 tablet (25 mg total) by mouth every 6 (six) hours as needed for anxiety. 16 tablet Breshae Belcher, [place] C, PA-C     I have reviewed the PDMP during this encounter.   Lew Dawes, [place] 02/16/19 2033

## 2019-02-16 NOTE — ED Triage Notes (Signed)
Pt here with left arm pain starting at the elbow; pt sts pain into upper leg and some in back

## 2019-03-03 ENCOUNTER — Emergency Department (HOSPITAL_COMMUNITY): Payer: Medicaid Other

## 2019-03-03 ENCOUNTER — Encounter (HOSPITAL_COMMUNITY): Payer: Self-pay | Admitting: Emergency Medicine

## 2019-03-03 ENCOUNTER — Other Ambulatory Visit: Payer: Self-pay

## 2019-03-03 ENCOUNTER — Emergency Department (HOSPITAL_COMMUNITY)
Admission: EM | Admit: 2019-03-03 | Discharge: 2019-03-03 | Disposition: A | Discharge status: Home / Self Care | Payer: Medicaid Other | Attending: Emergency Medicine | Admitting: Emergency Medicine

## 2019-03-03 DIAGNOSIS — Z79899 Other long term (current) drug therapy: Secondary | ICD-10-CM | POA: Diagnosis not present

## 2019-03-03 DIAGNOSIS — I471 Supraventricular tachycardia: Secondary | ICD-10-CM | POA: Insufficient documentation

## 2019-03-03 DIAGNOSIS — R202 Paresthesia of skin: Secondary | ICD-10-CM | POA: Insufficient documentation

## 2019-03-03 DIAGNOSIS — R002 Palpitations: Secondary | ICD-10-CM

## 2019-03-03 DIAGNOSIS — Z87891 Personal history of nicotine dependence: Secondary | ICD-10-CM | POA: Diagnosis not present

## 2019-03-03 LAB — CBC WITH DIFFERENTIAL/PLATELET
Abs Immature Granulocytes: 0.01 10*3/uL (ref 0.00–0.07)
Basophils Absolute: 0 10*3/uL (ref 0.0–0.1)
Basophils Relative: 0 %
Eosinophils Absolute: 0.1 10*3/uL (ref 0.0–0.5)
Eosinophils Relative: 1 %
HCT: 36.8 % (ref 36.0–46.0)
Hemoglobin: 11.3 g/dL — ABNORMAL LOW (ref 12.0–15.0)
Immature Granulocytes: 0 %
Lymphocytes Relative: 43 %
Lymphs Abs: 3.6 10*3/uL (ref 0.7–4.0)
MCH: 27 pg (ref 26.0–34.0)
MCHC: 30.7 g/dL (ref 30.0–36.0)
MCV: 87.8 fL (ref 80.0–100.0)
Monocytes Absolute: 0.7 10*3/uL (ref 0.1–1.0)
Monocytes Relative: 8 %
Neutro Abs: 4 10*3/uL (ref 1.7–7.7)
Neutrophils Relative %: 48 %
Platelets: 417 10*3/uL — ABNORMAL HIGH (ref 150–400)
RBC: 4.19 MIL/uL (ref 3.87–5.11)
RDW: 17.8 % — ABNORMAL HIGH (ref 11.5–15.5)
WBC: 8.4 10*3/uL (ref 4.0–10.5)
nRBC: 0 % (ref 0.0–0.2)

## 2019-03-03 LAB — URINALYSIS, ROUTINE W REFLEX MICROSCOPIC
Bacteria, UA: NONE SEEN
Bilirubin Urine: NEGATIVE
Glucose, UA: NEGATIVE mg/dL
Ketones, ur: NEGATIVE mg/dL
Leukocytes,Ua: NEGATIVE
Nitrite: NEGATIVE
Protein, ur: NEGATIVE mg/dL
Specific Gravity, Urine: 1.004 — ABNORMAL LOW (ref 1.005–1.030)
pH: 6 (ref 5.0–8.0)

## 2019-03-03 LAB — COMPREHENSIVE METABOLIC PANEL
ALT: 13 U/L (ref 0–44)
AST: 20 U/L (ref 15–41)
Albumin: 4.3 g/dL (ref 3.5–5.0)
Alkaline Phosphatase: 65 U/L (ref 38–126)
Anion gap: 13 (ref 5–15)
BUN: 5 mg/dL — ABNORMAL LOW (ref 6–20)
CO2: 22 mmol/L (ref 22–32)
Calcium: 9.6 mg/dL (ref 8.9–10.3)
Chloride: 102 mmol/L (ref 98–111)
Creatinine, Ser: 0.9 mg/dL (ref 0.44–1.00)
GFR calc Af Amer: >60 mL/min (ref >60)
GFR calc non Af Amer: >60 mL/min (ref >60)
Glucose, Bld: 107 mg/dL — ABNORMAL HIGH (ref 70–99)
Potassium: 3.6 mmol/L (ref 3.5–5.1)
Sodium: 137 mmol/L (ref 135–145)
Total Bilirubin: 0.5 mg/dL (ref 0.3–1.2)
Total Protein: 8.2 g/dL — ABNORMAL HIGH (ref 6.5–8.1)

## 2019-03-03 LAB — I-STAT BETA HCG BLOOD, ED (MC, WL, AP ONLY)
I-stat hCG, quantitative: <5 m[IU]/mL (ref <5)
I-stat hCG, quantitative: <5 m[IU]/mL (ref <5)

## 2019-03-03 LAB — TROPONIN I (HIGH SENSITIVITY)
Troponin I (High Sensitivity): <2 ng/L (ref <18)
Troponin I (High Sensitivity): <2 ng/L (ref <18)

## 2019-03-03 NOTE — ED Provider Notes (Signed)
[facility] EMERGENCY DEPARTMENT Provider Note   CSN: [id number] Arrival date & time: 03/03/19  0303     History   Chief Complaint Chief Complaint  Patient presents with  . Palpitations    Chest Discomfort    HPI Heather Lynch is a 33 y.o. female.     Patient is a 33 year old female with past medical history of C-section x5 in the past.  She presents today with complaints of palpitations.  Patient has been experiencing this intermittently since having a miscarriage earlier this year.  She states she occasionally will feel what she describes as a "flutter" in her chest.  This generally lasts for several minutes, then resolves.  She has been seen by a cardiologist and is currently wearing a Holter monitor.  She tells me that she received a call from the monitor representatives while in the ambulance this evening asking if she experienced any palpitations.  Patient also describes several weeks of other issues including tingling to her left arm and leg, flank pain, abdominal pain.  She was hospitalized several months ago with a kidney infection.  She does tell me that she has been very stressed out since her miscarriage.  She has 7 children at home and reports being quite busy.  The history is provided by the patient.  Palpitations Palpitations quality:  Unable to specify Onset quality:  Sudden Progression:  Resolved Chronicity:  Recurrent Relieved by:  Nothing Worsened by:  Nothing Ineffective treatments:  None tried Associated symptoms: no leg pain, no lower extremity edema and no shortness of breath     Past Medical History:  Diagnosis Date  . Anemia   . Depression    refuses to take meds  . Gonorrhea   . Heart murmur   . Pneumonia   . Preterm delivery 10/20/2018  . Spontaneous abortion 10/20/2018  . Urinary tract infection     Patient Active Problem List   Diagnosis Date Noted  . Leucocytosis 12/26/2018  . Acute pyelonephritis 12/26/2018  .  Sepsis due to gram-negative UTI ([facility]) 12/26/2018    Past Surgical History:  Procedure Laterality Date  . CESAREAN SECTION     X 5  . DILATION AND CURETTAGE OF UTERUS N/A 10/20/2018   Procedure: DILATATION AND CURETTAGE;  Surgeon: Adam Phenix, MD;  Location: [facility] LD ORS;  Service: Gynecology;  Laterality: N/A;     OB History    Gravida  8   Para  8   Term  6   Preterm  1   AB      Living  7     SAB      TAB      Ectopic      Multiple  0   Live Births  7            Home Medications    Prior to Admission medications   Medication Sig Start Date End Date Taking? Authorizing Provider  hydrOXYzine (ATARAX/VISTARIL) 25 MG tablet Take 1 tablet (25 mg total) by mouth every 6 (six) hours as needed for anxiety. 02/16/19   Wieters, Hallie C, PA-C  naproxen (NAPROSYN) 500 MG tablet Take 1 tablet (500 mg total) by mouth 2 (two) times daily. 02/16/19   Wieters, Hallie C, PA-C  Prenatal Vit-Fe Fumarate-FA (PRENATAL MULTIVITAMIN) TABS tablet Take 1 tablet by mouth daily at 12 noon.    [provider]  vitamin C (ASCORBIC ACID) 500 MG tablet Take 500 mg by mouth daily.  [provider]  ferrous sulfate (FERROUSUL) 325 (65 FE) MG tablet Take 1 tablet (325 mg total) by mouth 2 (two) times daily. Patient not taking: Reported on 11/27/2018 10/21/18 02/16/19  Constant, Peggy, MD    Family History Family History  Problem Relation Age of Onset  . Hypertension Other   . Other Neg Hx     Social History Social History   Tobacco Use  . Smoking status: Former Smoker    Quit date: 12/30/2011    Years since quitting: 7.1  . Smokeless tobacco: Never Used  Substance Use Topics  . Alcohol use: Yes    Comment: occasional  . Drug use: Yes    Types: Marijuana    Comment: occassional     Allergies   Patient has no known allergies.   Review of Systems Review of Systems  Respiratory: Negative for shortness of breath.   Cardiovascular: Positive for palpitations.   All other systems reviewed and are negative.    Physical Exam Updated Vital Signs BP 127/84 (BP Location: Right Arm)   Pulse 92   Temp 98.4 F (36.9 C) (Oral)   Resp (!) 22   Ht 5\' 8"  (1.727 m)   Wt (!) 145.2 kg   LMP 02/02/2019 (Approximate)   SpO2 100%   BMI 48.66 kg/m   Physical Exam Vitals signs and nursing note reviewed.  Constitutional:      General: She is not in acute distress.    Appearance: She is well-developed. She is not diaphoretic.  HENT:     Head: Normocephalic and atraumatic.  Neck:     Musculoskeletal: Normal range of motion and neck supple.  Cardiovascular:     Rate and Rhythm: Normal rate and regular rhythm.     Heart sounds: No murmur. No friction rub. No gallop.   Pulmonary:     Effort: Pulmonary effort is normal. No respiratory distress.     Breath sounds: Normal breath sounds. No wheezing.  Abdominal:     General: Bowel sounds are normal. There is no distension.     Palpations: Abdomen is soft.     Tenderness: There is no abdominal tenderness.  Musculoskeletal: Normal range of motion.        General: No swelling or tenderness.     Right lower leg: No edema.     Left lower leg: No edema.  Skin:    General: Skin is warm and dry.  Neurological:     General: No focal deficit present.     Mental Status: She is alert and oriented to person, place, and time.     Cranial Nerves: No cranial nerve deficit.     Sensory: No sensory deficit.     Motor: No weakness.     Coordination: Coordination normal.      ED Treatments / Results  Labs (all labs ordered are listed, but only abnormal results are displayed) Labs Reviewed  CBC WITH DIFFERENTIAL/PLATELET - Abnormal; Notable for the following components:      Result Value   Hemoglobin 11.3 (*)    RDW 17.8 (*)    Platelets 417 (*)    All other components within normal limits  COMPREHENSIVE METABOLIC PANEL - Abnormal; Notable for the following components:   Glucose, Bld 107 (*)    BUN 5 (*)     Total Protein 8.2 (*)    All other components within normal limits  URINALYSIS, ROUTINE W REFLEX MICROSCOPIC - Abnormal; Notable for the following components:   Color,  Urine STRAW (*)    Specific Gravity, Urine 1.004 (*)    Hgb urine dipstick SMALL (*)    All other components within normal limits  I-STAT BETA HCG BLOOD, ED ([facility], [facility], [facility] ONLY)  I-STAT BETA HCG BLOOD, ED ([facility], [facility], [facility] ONLY)  TROPONIN I (HIGH SENSITIVITY)  TROPONIN I (HIGH SENSITIVITY)    EKG EKG Interpretation  Date/Time:  Wednesday March 03 2019 03:34:57 EDT Ventricular Rate:  103 PR Interval:  186 QRS Duration: 84 QT Interval:  332 QTC Calculation: 434 R Axis:   80 Text Interpretation:  Sinus tachycardia Otherwise normal ECG Confirmed by Geoffery LyonseLo, Nahum Sherrer ([id number]) on 03/03/2019 8:12:49 AM   Radiology Dg Chest 2 View  Result Date: 03/03/2019 CLINICAL DATA:  Palpitation and chest discomfort EXAM: CHEST - 2 VIEW COMPARISON:  12/26/2018 FINDINGS: The heart size and mediastinal contours are within normal limits. Both lungs are clear. The visualized skeletal structures are unremarkable. IMPRESSION: No active cardiopulmonary disease. Electronically Signed   By: Jasmine PangKim  Fujinaga M.D.   On: 03/03/2019 03:57    Procedures Procedures (including critical care time)  Medications Ordered in ED Medications - No data to display   Initial Impression / Assessment and Plan / ED Course  I have reviewed the triage vital signs and the nursing notes.  Pertinent labs & imaging results that were available during my care of the patient were reviewed by me and considered in my medical decision making (see chart for details).  Patient presents with complaints of episodic palpitations and numbness that have been occurring for the past several months.  Earlier this a.m., the patient felt worse and was brought here by EMS.  During transport, she received a call from the people monitoring her Holter tracing who inquired as to whether or not she  had experienced palpitations minutes before.  The patient did experience an episode of palpitations that coincided with what the Holter monitoring service felt to be SVT.  Her work-up here shows no acute abnormality.  Her EKG shows a normal sinus rhythm and is unchanged from prior studies.  Her troponin is negative x2 and blood counts and electrolytes are essentially unremarkable.  A TSH was added on and results are pending at this time.  Patient has a follow-up with cardiology for an echocardiogram in the next few days.  I have advised her to discuss this episode with them when she returns for follow-up.  Her vitals are stable and she appears very comfortable and I believe appropriate for discharge.  Final Clinical Impressions(s) / ED Diagnoses   Final diagnoses:  None    ED Discharge Orders    None       Geoffery Lyonselo, Kynadi Dragos, MD 03/03/19 1008

## 2019-03-03 NOTE — Discharge Instructions (Addendum)
Follow-up with your cardiologist as scheduled, and return to the emergency department in the meantime if you develop worsening palpitations, severe chest pain, difficulty breathing, or other new and concerning symptoms.

## 2019-03-03 NOTE — ED Triage Notes (Signed)
Patient reports intermittent palpitations /chest discomfort with mild SOB and left arm tingling onset this evening , denies emesis or diaphoresis , patient added UTI symptoms with concentrated urine/dysuria today .

## 2019-03-04 ENCOUNTER — Ambulatory Visit (INDEPENDENT_AMBULATORY_CARE_PROVIDER_SITE_OTHER): Payer: Medicaid Other | Admitting: Primary Care

## 2019-03-07 NOTE — Progress Notes (Deleted)
Cardiology Office Note:    Date:  03/07/2019   ID:  Heather Lynch, DOB [birth year], MRN [id number]  PCP:  Patient, No Pcp Per  Cardiologist:  No primary care provider on file.  Electrophysiologist:  None   Referring MD: No ref. provider found   No chief complaint on file. ***  History of Present Illness:    Heather Lynch is a 33 y.o. female with a hx of depression who presents for initial evaluation of palpitations.  She was seen in the [facility] ED on 03/03/2019 for palpitations.  Described having a fluttering feeling in her chest, that would last for several minutes.  She had seen cardiologist and was wearing a Holter at the time of presentation to the ED.  She was told she was having SVT on her Holter.  Work-up in the ED was unremarkable, including EKG, troponin, labs.  Has not had TSH checked.  Past Medical History:  Diagnosis Date  . Anemia   . Depression    refuses to take meds  . Gonorrhea   . Heart murmur   . Pneumonia   . Preterm delivery 10/20/2018  . Spontaneous abortion 10/20/2018  . Urinary tract infection     Past Surgical History:  Procedure Laterality Date  . CESAREAN SECTION     X 5  . DILATION AND CURETTAGE OF UTERUS N/A 10/20/2018   Procedure: DILATATION AND CURETTAGE;  Surgeon: Adam Phenix, MD;  Location: [facility] LD ORS;  Service: Gynecology;  Laterality: N/A;    Current Medications: No outpatient medications have been marked as taking for the 03/08/19 encounter (Appointment) with Little Ishikawa, MD.     Allergies:   Patient has no known allergies.   Social History   Socioeconomic History  . Marital status: Single    Spouse name: Not on file  . Number of children: Not on file  . Years of education: Not on file  . Highest education level: Not on file  Occupational History  . Not on file  Social Needs  . Financial resource strain: Not on file  . Food insecurity    Worry: Not on file    Inability: Not on file  . Transportation needs    Medical: Not on file    Non-medical: Not on file  Tobacco Use  . Smoking status: Former Smoker    Quit date: 12/30/2011    Years since quitting: 7.1  . Smokeless tobacco: Never Used  Substance and Sexual Activity  . Alcohol use: Yes    Comment: occasional  . Drug use: Yes    Types: Marijuana    Comment: occassional  . Sexual activity: Yes    Partners: Male    Birth control/protection: None  Lifestyle  . Physical activity    Days per week: Not on file    Minutes per session: Not on file  . Stress: Not on file  Relationships  . Social Musician on phone: Not on file    Gets together: Not on file    Attends religious service: Not on file    Active member of club or organization: Not on file    Attends meetings of clubs or organizations: Not on file    Relationship status: Not on file  Other Topics Concern  . Not on file  Social History Narrative  . Not on file     Family History: The patient's ***family history includes Hypertension in an other family member. There is no history of  Other.  ROS:   Please see the history of present illness.    *** All other systems reviewed and are negative.  EKGs/Labs/Other Studies Reviewed:    The following studies were reviewed today: ***  EKG:  EKG is *** ordered today.  The ekg ordered today demonstrates ***  Recent Labs: 03/03/2019: ALT 13; BUN 5; Creatinine, Ser 0.90; Hemoglobin 11.3; Platelets 417; Potassium 3.6; Sodium 137  Recent Lipid Panel No results found for: CHOL, TRIG, HDL, CHOLHDL, VLDL, LDLCALC, LDLDIRECT  Physical Exam:    VS:  There were no vitals taken for this visit.    Wt Readings from Last 3 Encounters:  03/03/19 (!) 320 lb (145.2 kg)  11/27/18 (!) 321 lb 6.4 oz (145.8 kg)  11/03/18 (!) 326 lb 5 oz (148 kg)     GEN: *** Well nourished, well developed in no acute distress HEENT: Normal NECK: No JVD; No carotid bruits LYMPHATICS: No lymphadenopathy CARDIAC: ***RRR, no murmurs, rubs,  gallops RESPIRATORY:  Clear to auscultation without rales, wheezing or rhonchi  ABDOMEN: Soft, non-tender, non-distended MUSCULOSKELETAL:  No edema; No deformity  SKIN: Warm and dry NEUROLOGIC:  Alert and oriented x 3 PSYCHIATRIC:  Normal affect   ASSESSMENT:    No diagnosis found. PLAN:    In order of problems listed above:  1. ***   Medication Adjustments/Labs and Tests Ordered: Current medicines are reviewed at length with the patient today.  Concerns regarding medicines are outlined above.  No orders of the defined types were placed in this encounter.  No orders of the defined types were placed in this encounter.   There are no Patient Instructions on file for this visit.   Signed, Donato Heinz, MD  03/07/2019 2:22 PM    [facility]

## 2019-03-08 ENCOUNTER — Ambulatory Visit: Payer: Medicaid Other | Admitting: Cardiology

## 2019-03-15 ENCOUNTER — Emergency Department (HOSPITAL_COMMUNITY)
Admission: EM | Admit: 2019-03-15 | Discharge: 2019-03-15 | Disposition: A | Discharge status: Home / Self Care | Payer: Medicaid Other

## 2019-03-15 NOTE — ED Notes (Signed)
Patient left before being triaged.

## 2019-03-17 ENCOUNTER — Ambulatory Visit: Payer: Medicaid Other | Admitting: Obstetrics and Gynecology

## 2019-04-07 ENCOUNTER — Encounter: Payer: Medicaid Other | Admitting: Family Medicine

## 2019-04-12 ENCOUNTER — Encounter: Payer: Self-pay | Admitting: Obstetrics and Gynecology

## 2019-04-12 ENCOUNTER — Ambulatory Visit (INDEPENDENT_AMBULATORY_CARE_PROVIDER_SITE_OTHER): Payer: Medicaid Other | Admitting: Obstetrics and Gynecology

## 2019-04-12 ENCOUNTER — Other Ambulatory Visit (HOSPITAL_COMMUNITY)
Admission: RE | Admit: 2019-04-12 | Discharge: 2019-04-12 | Disposition: A | Discharge status: Home / Self Care | Payer: Medicaid Other | Source: Ambulatory Visit | Attending: Family Medicine | Admitting: Family Medicine

## 2019-04-12 ENCOUNTER — Encounter: Payer: Self-pay | Admitting: Radiology

## 2019-04-12 ENCOUNTER — Other Ambulatory Visit: Payer: Self-pay

## 2019-04-12 VITALS — BP 129/75 | HR 74 | Wt 304.5 lb

## 2019-04-12 DIAGNOSIS — Z3202 Encounter for pregnancy test, result negative: Secondary | ICD-10-CM

## 2019-04-12 DIAGNOSIS — B3731 Acute candidiasis of vulva and vagina: Secondary | ICD-10-CM

## 2019-04-12 DIAGNOSIS — R109 Unspecified abdominal pain: Secondary | ICD-10-CM | POA: Diagnosis not present

## 2019-04-12 DIAGNOSIS — Z124 Encounter for screening for malignant neoplasm of cervix: Secondary | ICD-10-CM

## 2019-04-12 DIAGNOSIS — Z8659 Personal history of other mental and behavioral disorders: Secondary | ICD-10-CM

## 2019-04-12 DIAGNOSIS — B373 Candidiasis of vulva and vagina: Secondary | ICD-10-CM

## 2019-04-12 DIAGNOSIS — K921 Melena: Secondary | ICD-10-CM | POA: Diagnosis not present

## 2019-04-12 LAB — POCT URINE PREGNANCY: Preg Test, Ur: NEGATIVE

## 2019-04-12 MED ORDER — FLUCONAZOLE 150 MG PO TABS: 150.0000 mg | ORAL_TABLET | Freq: Once | ORAL | 1 refills | Status: AC

## 2019-04-12 NOTE — Progress Notes (Signed)
Obstetrics and Gynecology New Patient Evaluation  Appointment Date: 04/12/2019  OBGYN Clinic: [facility] for [facility]  Primary Care Provider: Patient, No Pcp Per  Referring Provider: No ref. provider found  Chief Complaint:  Chief Complaint  Patient presents with  . Gynecologic Exam    History of Present Illness: Heather Lynch is a 33 y.o. African-American [id number] (Patient's last menstrual period was 04/01/2019.), seen for the above chief complaint.   Melena: pt notes that for past two weeks she's noticed BRB in her BMs (not with wiping). No diarrhea or constipation  Abdominal pain: she states that she has LUQ discomfort that goes from there to her arm and down her and leg and started after her D&C in the middle of this year. Last period was only about two weeks in between them. +vag itching and discharge.   Review of Systems:  as noted in the History of Present Illness.   Past Medical History:  Past Medical History:  Diagnosis Date  . Anemia   . Depression    refuses to take meds  . Gonorrhea   . Heart murmur   . Pneumonia   . Preterm delivery 10/20/2018  . Spontaneous abortion 10/20/2018  . Urinary tract infection     Past Surgical History:  Past Surgical History:  Procedure Laterality Date  . CESAREAN SECTION     X 5  . DILATION AND CURETTAGE OF UTERUS N/A 10/20/2018   Procedure: DILATATION AND CURETTAGE;  Surgeon: Adam Phenix, MD;  Location: [facility] LD [facility]S;  Service: Gynecology;  Laterality: N/A;    Past Obstetrical History:  OB History  Gravida Para Term Preterm AB Living  8 8 6 1   7   SAB TAB Ectopic Multiple Live Births        0 7    # Outcome Date GA Lbr Len/2nd Weight Sex Delivery Anes PTL Lv  8 Para 10/20/18 [redacted]w[redacted]d / 00:01 5.3 oz (0.15 kg) M Vag-Spont None  FD  7 Term 04/07/12 [redacted]w[redacted]d   F CS-LTranv Spinal  LIV  6 Term 2012    M CS-LTranv Spinal  LIV  5 Term 2009    F CS-LTranv Spinal  LIV  4 Term 2007    F CS-LTranv Spinal  LIV  3  Preterm 2006 [redacted]w[redacted]d   M Vag-Spont EPI Y LIV  2 Term 2004    M CS-LTranv Spinal  LIV  1 Term         LIV    Past Gynecological History: As per HPI.  Social History:  Social History   Socioeconomic History  . Marital status: Single    Spouse name: Not on file  . Number of children: Not on file  . Years of education: Not on file  . Highest education level: Not on file  Occupational History  . Not on file  Social Needs  . Financial resource strain: Not on file  . Food insecurity    Worry: Not on file    Inability: Not on file  . Transportation needs    Medical: Not on file    Non-medical: Not on file  Tobacco Use  . Smoking status: Former Smoker    Quit date: 12/30/2011    Years since quitting: 7.2  . Smokeless tobacco: Never Used  Substance and Sexual Activity  . Alcohol use: Yes    Comment: occasional  . Drug use: Yes    Types: Marijuana    Comment: occassional  . Sexual activity: Yes  Partners: Male    Birth control/protection: None  Lifestyle  . Physical activity    Days per week: Not on file    Minutes per session: Not on file  . Stress: Not on file  Relationships  . Social Herbalist on phone: Not on file    Gets together: Not on file    Attends religious service: Not on file    Active member of club or organization: Not on file    Attends meetings of clubs or organizations: Not on file    Relationship status: Not on file  . Intimate partner violence    Fear of current or ex partner: Not on file    Emotionally abused: Not on file    Physically abused: Not on file    Forced sexual activity: Not on file  Other Topics Concern  . Not on file  Social History Narrative  . Not on file    Family History:  Family History  Problem Relation Age of Onset  . Hypertension Other   . Other Neg Hx     Medications None  Allergies Patient has no known allergies.  Physical Exam:  BP 129/75   Pulse 74   Wt (!) 304 lb 8 oz (138.1 kg)   LMP  04/01/2019   BMI 46.30 kg/m  Body mass index is 46.3 kg/m.  General appearance: Well nourished, well developed female in no acute distress.  Cardiovascular: normal s1 and s2.  No murmurs, rubs or gallops. Respiratory:  Clear to auscultation bilateral. Normal respiratory effort Abdomen: positive bowel sounds and no masses, hernias; diffusely non tender to palpation, non distended Neuro/Psych:  Normal mood and affect.  Skin:  Warm and dry.  Lymphatic:  No inguinal lymphadenopathy.   Pelvic exam: is limited by body habitus EGBUS: +white cottage cheese like d/c, +erythema and irritation, Vagina: within normal limits and with no blood or discharge in the vault, Cervix: normal appearing cervix without tenderness, discharge or lesions. Uterus:  nonenlarged and non tender and Adnexa:  normal adnexa and no mass, fullness, tenderness Rectovaginal: deferred  Laboratory: none  Radiology: none  Assessment: pt stable  Plan:  1. Abdominal pain, unspecified abdominal location D/w her that s/s dont sound GYN related and may be related to her flutters; she states she didn't want to take the meds the cardiologist recommended and would like a 2nd opinion. Referral to primary care made. Will get an u/s  - POC Urine Pregnancy (Dx code Z32.02) - US PELVIC COMPLETE WITH TRANSVAGINAL; Future  2. Melena - Ambulatory referral to Gastroenterology  3. Vulvovaginal candidiasis diflucan - Hemoglobin A1c  4. History of depression - Ambulatory referral to [place] - TSH  5. Cervical cancer screening - Cytology - PAP( [place])  Orders Placed This Encounter  Procedures  . US PELVIC COMPLETE WITH TRANSVAGINAL  . Hemoglobin A1c  . TSH  . Ambulatory referral to [place]  . Ambulatory referral to Gastroenterology  . POC Urine Pregnancy (Dx code Z32.02)    RTC 1 month  [name] MD Attending Center for [facility])

## 2019-04-12 NOTE — Progress Notes (Signed)
Patient is here to discuss her pain in the abdominal area this pain started after D/C on June 2,2020.She has h/o six c-section.

## 2019-04-13 LAB — HEMOGLOBIN A1C
Est. average glucose Bld gHb Est-mCnc: 108 mg/dL
Hgb A1c MFr Bld: 5.4 % (ref 4.8–5.6)

## 2019-04-13 LAB — TSH: TSH: 1.42 u[IU]/mL (ref 0.450–4.500)

## 2019-04-14 LAB — CYTOLOGY - PAP
Chlamydia: NEGATIVE
Comment: NEGATIVE
Comment: NEGATIVE
Comment: NEGATIVE
Comment: NORMAL
Diagnosis: NEGATIVE
High risk HPV: NEGATIVE
Neisseria Gonorrhea: NEGATIVE
Trichomonas: NEGATIVE

## 2019-04-20 ENCOUNTER — Ambulatory Visit (HOSPITAL_COMMUNITY): Payer: Medicaid Other | Attending: Obstetrics and Gynecology

## 2019-04-22 NOTE — BH Specialist Note (Signed)
Integrated Behavioral Health via Telemedicine Video Visit  04/22/2019 [place] [id number]  Number of [place] visits: 1 Session Start time: 9:15 Session End time: 10:19 Total time: 74  Referring Provider: Aletha Halim, MD Type of Visit: Video Patient/Family location: Home  [facility] Provider location: [facility] All persons participating in visit: Patient Heather Lynch and [place]    Confirmed patient's address: Yes  Confirmed patient's phone number: Yes  Any changes to demographics: No   Confirmed patient's insurance: Yes  Any changes to patient's insurance: No   Discussed confidentiality: Yes   I connected with [facility]  by a video enabled telemedicine application and verified that I am speaking with the correct person using two identifiers.     I discussed the limitations of evaluation and management by telemedicine and the availability of in person appointments.  I discussed that the purpose of this visit is to provide behavioral health care while limiting exposure to the novel coronavirus.   Discussed there is a possibility of technology failure and discussed alternative modes of communication if that failure occurs.  I discussed that engaging in this video visit, they consent to the provision of behavioral healthcare and the services will be billed under their insurance.  Patient and/or legal guardian expressed understanding and consented to video visit: Yes   PRESENTING CONCERNS: Patient and/or family reports the following symptoms/concerns: Pt states her primary concern today is feeling depressed after a breakup with boyfriend of almost 2 years; after fetal loss at 49 weeks in June,  She began isolating herself, and he started drinking to cope with their grief. Pt also experiencing life stress of going through legal process of paying child support, being sued, and not having primary custody of older children, in the midst of  grieving.  Duration of problem: Began after loss in June, escalated after breakup one month ago; Severity of problem: moderate  STRENGTHS (Protective Factors/Coping Skills): Resiliency  GOALS ADDRESSED: Patient will: 1.  Reduce symptoms of: depression and stress  2.  Increase knowledge and/or ability of: stress reduction  3.  Demonstrate ability to: Increase healthy adjustment to current life circumstances and Continue healthy grieving over loss  INTERVENTIONS: Interventions utilized:  Brief CBT and Supportive Counseling Standardized Assessments completed: GAD-7 and PHQ 9  ASSESSMENT: Patient currently experiencing Grief and Psychosocial stress.   Patient may benefit from psychoeducation and brief therapeutic interventions regarding coping with symptoms of depression related to grief and life stress .  PLAN: 1. Follow up with behavioral health clinician on : Two weeks 2. Behavioral recommendations:  -Write letter to ex-boyfriend, but do not send yet -Take five love languages quiz at [email or web address]  -Prioritize self care for two weeks 3. Referral(s): [facility] (In Clinic)  I discussed the assessment and treatment plan with the patient and/or parent/guardian. They were provided an opportunity to ask questions and all were answered. They agreed with the plan and demonstrated an understanding of the instructions.   They were advised to call back or seek an in-person evaluation if the symptoms worsen or if the condition fails to improve as anticipated.  Caroleen Hamman Ashante Snelling  Depression screen [facility] 2/9 04/23/2019  Decreased Interest 1  Down, Depressed, Hopeless 1  PHQ - 2 Score 2  Altered sleeping 3  Tired, decreased energy 3  Change in appetite 3  Feeling bad or failure about yourself  0  Trouble concentrating 2  Moving slowly or fidgety/restless 0  Suicidal thoughts 0  PHQ-9  Score 13   GAD 7 : Generalized Anxiety Score 04/23/2019  Nervous,  Anxious, on Edge 1  Control/stop worrying 1  Worry too much - different things 3  Trouble relaxing 0  Restless 0  Easily annoyed or irritable 0  Afraid - awful might happen 1  Total GAD 7 Score 6

## 2019-04-23 ENCOUNTER — Ambulatory Visit (INDEPENDENT_AMBULATORY_CARE_PROVIDER_SITE_OTHER): Payer: Medicaid Other | Admitting: Clinical

## 2019-04-23 ENCOUNTER — Other Ambulatory Visit: Payer: Self-pay

## 2019-04-23 DIAGNOSIS — Z658 Other specified problems related to psychosocial circumstances: Secondary | ICD-10-CM | POA: Diagnosis not present

## 2019-04-23 DIAGNOSIS — F4321 Adjustment disorder with depressed mood: Secondary | ICD-10-CM

## 2019-04-29 NOTE — BH Specialist Note (Signed)
Integrated Behavioral Health via Telemedicine video Visit  04/29/2019 [place] [id number]  Number of [place] visits: 2 Session Start time: 10:54  Session End time: 11:29 Total time: 75  Referring Provider: Aletha Halim, MD Type of Visit: video Patient/Family location: Home [facility] Provider location: [facility] All persons participating in visit: Patient Heather Lynch and [place]    Confirmed patient's address: Yes  Confirmed patient's phone number: Yes  Any changes to demographics: No   Confirmed patient's insurance: Yes  Any changes to patient's insurance: No   Discussed confidentiality: At previous visit  I connected with [facility]  by a video enabled telemedicine application and verified that I am speaking with the correct person using two identifiers.     I discussed the limitations of evaluation and management by telemedicine and the availability of in person appointments.  I discussed that the purpose of this visit is to provide behavioral health care while limiting exposure to the novel coronavirus.   Discussed there is a possibility of technology failure and discussed alternative modes of communication if that failure occurs.  I discussed that engaging in this video visit, they consent to the provision of behavioral healthcare and the services will be billed under their insurance.  Patient and/or legal guardian expressed understanding and consented to video visit: Yes   PRESENTING CONCERNS: Patient and/or family reports the following symptoms/concerns: Pt states that her relationship is improving with boyfriend and their shared grief; has positive activities to look forward to, and symptoms of depression are decreasing.Pt states her primary symptom is worry and time management with a new job, but feels she is coping well at this time.  Duration of problem: Increase over one month (after breakup); began after loss in  June; Severity of problem: moderate  STRENGTHS (Protective Factors/Coping Skills): Resiliency; good social support  GOALS ADDRESSED: Patient will: 1.  Reduce symptoms of: depression and stress   2.  Demonstrate ability to: Increase healthy adjustment to current life circumstances and Continue healthy grieving over loss  INTERVENTIONS: Interventions utilized:  Behavioral Activation and Supportive Counseling Standardized Assessments completed: GAD-7 and PHQ 9  ASSESSMENT: Patient currently experiencing Grief and Psychosocial stress.   Patient may benefit from continued psychoeducation and brief therapeutic interventions regarding coping with symptoms of depression, anxiety and life stress .  PLAN: 1. Follow up with behavioral health clinician on : As needed, if symptoms increase 2. Behavioral recommendations:  -Continue with plan to set up [place] to prioritize self care daily; consider adding apps (as discussed) as additional self-care -Continue with plan to spend time with children, boyfriend, and good friend around the new year(3 outings, short-term plan) -Consider planning vacation (long-term plan) 3. Referral(s): [place] (In Clinic)  I discussed the assessment and treatment plan with the patient and/or parent/guardian. They were provided an opportunity to ask questions and all were answered. They agreed with the plan and demonstrated an understanding of the instructions.   They were advised to call back or seek an in-person evaluation if the symptoms worsen or if the condition fails to improve as anticipated.  Heather Lynch  Depression screen [facility] 2/9 05/07/2019 04/23/2019  Decreased Interest 1 1  Down, Depressed, Hopeless 0 1  PHQ - 2 Score 1 2  Altered sleeping 2 3  Tired, decreased energy 0 3  Change in appetite 0 3  Feeling bad or failure about yourself  0 0  Trouble concentrating 0 2  Moving slowly or fidgety/restless  0 0   Suicidal thoughts 0 0  PHQ-9 Score 3 13   GAD 7 : Generalized Anxiety Score 05/07/2019 04/23/2019  Nervous, Anxious, on Edge 0 1  Control/stop worrying 0 1  Worry too much - different things 3 3  Trouble relaxing 3 0  Restless 0 0  Easily annoyed or irritable 1 0  Afraid - awful might happen 0 1  Total GAD 7 Score 7 6

## 2019-05-07 ENCOUNTER — Other Ambulatory Visit: Payer: Self-pay

## 2019-05-07 ENCOUNTER — Ambulatory Visit (INDEPENDENT_AMBULATORY_CARE_PROVIDER_SITE_OTHER): Payer: Medicaid Other | Admitting: Clinical

## 2019-05-07 DIAGNOSIS — Z658 Other specified problems related to psychosocial circumstances: Secondary | ICD-10-CM

## 2019-05-07 DIAGNOSIS — F4321 Adjustment disorder with depressed mood: Secondary | ICD-10-CM

## 2019-05-10 ENCOUNTER — Ambulatory Visit: Payer: Medicaid Other | Admitting: Obstetrics and Gynecology

## 2019-06-01 ENCOUNTER — Encounter: Payer: Self-pay | Admitting: Obstetrics and Gynecology

## 2019-06-11 ENCOUNTER — Encounter (HOSPITAL_COMMUNITY): Payer: Self-pay | Admitting: Emergency Medicine

## 2019-06-11 ENCOUNTER — Other Ambulatory Visit: Payer: Self-pay

## 2019-06-11 ENCOUNTER — Inpatient Hospital Stay (HOSPITAL_COMMUNITY)
Admission: EM | Admit: 2019-06-11 | Discharge: 2019-06-12 | Disposition: A | Discharge status: Home / Self Care | Payer: Medicaid Other | Attending: Obstetrics & Gynecology | Admitting: Obstetrics & Gynecology

## 2019-06-11 DIAGNOSIS — O99011 Anemia complicating pregnancy, first trimester: Secondary | ICD-10-CM | POA: Insufficient documentation

## 2019-06-11 DIAGNOSIS — B373 Candidiasis of vulva and vagina: Secondary | ICD-10-CM | POA: Insufficient documentation

## 2019-06-11 DIAGNOSIS — D649 Anemia, unspecified: Secondary | ICD-10-CM | POA: Insufficient documentation

## 2019-06-11 DIAGNOSIS — R109 Unspecified abdominal pain: Secondary | ICD-10-CM | POA: Insufficient documentation

## 2019-06-11 DIAGNOSIS — B9689 Other specified bacterial agents as the cause of diseases classified elsewhere: Secondary | ICD-10-CM | POA: Insufficient documentation

## 2019-06-11 DIAGNOSIS — O98811 Other maternal infectious and parasitic diseases complicating pregnancy, first trimester: Secondary | ICD-10-CM | POA: Insufficient documentation

## 2019-06-11 DIAGNOSIS — N760 Acute vaginitis: Secondary | ICD-10-CM

## 2019-06-11 DIAGNOSIS — O26891 Other specified pregnancy related conditions, first trimester: Secondary | ICD-10-CM | POA: Insufficient documentation

## 2019-06-11 DIAGNOSIS — O26899 Other specified pregnancy related conditions, unspecified trimester: Secondary | ICD-10-CM

## 2019-06-11 DIAGNOSIS — B3731 Acute candidiasis of vulva and vagina: Secondary | ICD-10-CM

## 2019-06-11 DIAGNOSIS — Z6790 Unspecified blood type, Rh positive: Secondary | ICD-10-CM | POA: Insufficient documentation

## 2019-06-11 DIAGNOSIS — Z87891 Personal history of nicotine dependence: Secondary | ICD-10-CM | POA: Insufficient documentation

## 2019-06-11 DIAGNOSIS — Z3A01 Less than 8 weeks gestation of pregnancy: Secondary | ICD-10-CM | POA: Insufficient documentation

## 2019-06-11 NOTE — ED Triage Notes (Signed)
Patient reports low abdominal cramping with pink vaginal discharge this evening , LMP 04/19/2019 , unsure of AOG . No emesis or fever .

## 2019-06-12 ENCOUNTER — Encounter (HOSPITAL_COMMUNITY): Payer: Self-pay | Admitting: Obstetrics & Gynecology

## 2019-06-12 ENCOUNTER — Inpatient Hospital Stay (HOSPITAL_COMMUNITY): Payer: Medicaid Other

## 2019-06-12 DIAGNOSIS — D649 Anemia, unspecified: Secondary | ICD-10-CM | POA: Diagnosis not present

## 2019-06-12 DIAGNOSIS — Z3A01 Less than 8 weeks gestation of pregnancy: Secondary | ICD-10-CM

## 2019-06-12 DIAGNOSIS — O26891 Other specified pregnancy related conditions, first trimester: Secondary | ICD-10-CM

## 2019-06-12 DIAGNOSIS — O99011 Anemia complicating pregnancy, first trimester: Secondary | ICD-10-CM | POA: Diagnosis not present

## 2019-06-12 DIAGNOSIS — Z87891 Personal history of nicotine dependence: Secondary | ICD-10-CM | POA: Diagnosis not present

## 2019-06-12 DIAGNOSIS — B373 Candidiasis of vulva and vagina: Secondary | ICD-10-CM | POA: Diagnosis not present

## 2019-06-12 DIAGNOSIS — R109 Unspecified abdominal pain: Secondary | ICD-10-CM | POA: Diagnosis not present

## 2019-06-12 DIAGNOSIS — Z6790 Unspecified blood type, Rh positive: Secondary | ICD-10-CM | POA: Diagnosis not present

## 2019-06-12 DIAGNOSIS — B9689 Other specified bacterial agents as the cause of diseases classified elsewhere: Secondary | ICD-10-CM | POA: Diagnosis not present

## 2019-06-12 DIAGNOSIS — O98811 Other maternal infectious and parasitic diseases complicating pregnancy, first trimester: Secondary | ICD-10-CM | POA: Diagnosis not present

## 2019-06-12 LAB — COMPREHENSIVE METABOLIC PANEL
ALT: 21 U/L (ref 0–44)
AST: 21 U/L (ref 15–41)
Albumin: 3.4 g/dL — ABNORMAL LOW (ref 3.5–5.0)
Alkaline Phosphatase: 83 U/L (ref 38–126)
Anion gap: 8 (ref 5–15)
BUN: 9 mg/dL (ref 6–20)
CO2: 25 mmol/L (ref 22–32)
Calcium: 9.2 mg/dL (ref 8.9–10.3)
Chloride: 103 mmol/L (ref 98–111)
Creatinine, Ser: 0.69 mg/dL (ref 0.44–1.00)
GFR calc Af Amer: >60 mL/min (ref >60)
GFR calc non Af Amer: >60 mL/min (ref >60)
Glucose, Bld: 97 mg/dL (ref 70–99)
Potassium: 4.3 mmol/L (ref 3.5–5.1)
Sodium: 136 mmol/L (ref 135–145)
Total Bilirubin: <0.1 mg/dL — ABNORMAL LOW (ref 0.3–1.2)
Total Protein: 6.7 g/dL (ref 6.5–8.1)

## 2019-06-12 LAB — URINALYSIS, ROUTINE W REFLEX MICROSCOPIC
Bacteria, UA: NONE SEEN
Bilirubin Urine: NEGATIVE
Glucose, UA: NEGATIVE mg/dL
Hgb urine dipstick: NEGATIVE
Ketones, ur: NEGATIVE mg/dL
Nitrite: NEGATIVE
Protein, ur: NEGATIVE mg/dL
Specific Gravity, Urine: 1.014 (ref 1.005–1.030)
pH: 6 (ref 5.0–8.0)

## 2019-06-12 LAB — CBC
HCT: 32.8 % — ABNORMAL LOW (ref 36.0–46.0)
Hemoglobin: 10.3 g/dL — ABNORMAL LOW (ref 12.0–15.0)
MCH: 28.3 pg (ref 26.0–34.0)
MCHC: 31.4 g/dL (ref 30.0–36.0)
MCV: 90.1 fL (ref 80.0–100.0)
Platelets: 391 10*3/uL (ref 150–400)
RBC: 3.64 MIL/uL — ABNORMAL LOW (ref 3.87–5.11)
RDW: 15.7 % — ABNORMAL HIGH (ref 11.5–15.5)
WBC: 12.2 10*3/uL — ABNORMAL HIGH (ref 4.0–10.5)
nRBC: 0 % (ref 0.0–0.2)

## 2019-06-12 LAB — WET PREP, GENITAL
Sperm: NONE SEEN
Trich, Wet Prep: NONE SEEN

## 2019-06-12 LAB — POCT PREGNANCY, URINE: Preg Test, Ur: POSITIVE — AB

## 2019-06-12 LAB — HCG, QUANTITATIVE, PREGNANCY: hCG, Beta Chain, Quant, S: 138791 m[IU]/mL — ABNORMAL HIGH (ref <5)

## 2019-06-12 MED ORDER — METRONIDAZOLE 0.75 % VA GEL: 1.0000 | Freq: Every day | VAGINAL | 0 refills | Status: AC

## 2019-06-12 MED ORDER — FERROUS SULFATE 325 (65 FE) MG PO TABS: 325.0000 mg | ORAL_TABLET | Freq: Every day | ORAL | 3 refills | Status: DC

## 2019-06-12 MED ORDER — TERCONAZOLE 0.4 % VA CREA: 1.0000 | TOPICAL_CREAM | Freq: Every day | VAGINAL | 0 refills | Status: AC

## 2019-06-12 NOTE — MAU Provider Note (Signed)
History     CSN: [id number]  Arrival date and time: 06/11/19 2338   First Provider Initiated Contact with Patient 06/12/19 0214      Chief Complaint  Patient presents with  . Abdominal Pain   Heather Lynch is a 34 y.o. [id number] at [redacted]w[redacted]d who presents to MAU for abdominal pain.   Onset: yesterday AM Location: suprapubic Duration: ~24hrs Character: cramping, pulling sensation on left-hand side of abdomen, intermittent Aggravating/Associated: standing up too fast, certain movements/vaginal itching, odor and irritation, white discharge Relieving: none Treatment: none Severity: 4/10  Pt denies VB. Pt denies N/V, abdominal pain, constipation, diarrhea, or urinary problems. Pt denies fever, chills, fatigue, sweating or changes in appetite. Pt denies SOB or chest pain. Pt denies dizziness, HA, light-headedness, weakness.  Problems this pregnancy include: hx of C/S x6. Allergies? NKDA Current medications/supplements? PNVs Prenatal care provider? [facility], first appt 06/18/2019   OB History    Gravida  9   Para  8   Term  6   Preterm  1   AB      Living  7     SAB      TAB      Ectopic      Multiple  0   Live Births  7           Past Medical History:  Diagnosis Date  . Anemia   . Depression    refuses to take meds  . Gonorrhea   . Heart murmur   . Pneumonia   . Preterm delivery 10/20/2018  . Spontaneous abortion 10/20/2018  . Urinary tract infection     Past Surgical History:  Procedure Laterality Date  . CESAREAN SECTION     X 5  . DILATION AND CURETTAGE OF UTERUS N/A 10/20/2018   Procedure: DILATATION AND CURETTAGE;  Surgeon: Woodroe Mode, MD;  Location: [facility];  Service: Gynecology;  Laterality: N/A;    Family History  Problem Relation Age of Onset  . Hypertension Other   . Other Neg Hx     Social History   Tobacco Use  . Smoking status: Former Smoker    Quit date: 12/30/2011    Years since quitting: 7.4  . Smokeless  tobacco: Never Used  Substance Use Topics  . Alcohol use: Yes    Comment: occasional  . Drug use: Yes    Types: Marijuana    Comment: LAST SMOKED - NOV    Allergies: No Known Allergies  No medications prior to admission.    Review of Systems  Constitutional: Negative for chills, diaphoresis, fatigue and fever.  Eyes: Negative for visual disturbance.  Respiratory: Negative for shortness of breath.   Cardiovascular: Negative for chest pain.  Gastrointestinal: Positive for abdominal pain. Negative for constipation, diarrhea, nausea and vomiting.  Genitourinary: Positive for vaginal discharge. Negative for dysuria, flank pain, frequency, pelvic pain, urgency and vaginal bleeding.  Neurological: Negative for dizziness, weakness, light-headedness and headaches.   Physical Exam   Blood pressure 119/63, pulse 89, temperature 98.6 F (37 C), temperature source Oral, resp. rate 20, height 5\' 8"  (1.727 m), weight (!) 140.1 kg, last menstrual period 04/19/2019, SpO2 100 %, currently breastfeeding.  Patient Vitals for the past 24 hrs:  BP Temp Temp src Pulse Resp SpO2 Height Weight  06/12/19 0045 119/63 98.6 F (37 C) Oral 89 20 -- 5\' 8"  (1.727 m) (!) 140.1 kg  06/11/19 2347 132/74 97.9 F (36.6 C) Oral (!) 106 18 100 % -- --  Physical Exam  Constitutional: She is oriented to person, place, and time. She appears well-developed and well-nourished. No distress.  HENT:  Head: Normocephalic and atraumatic.  Respiratory: Effort normal.  GI: Soft. She exhibits no distension and no mass. There is no abdominal tenderness. There is no rebound and no guarding.  Neurological: She is alert and oriented to person, place, and time.  Skin: Skin is warm and dry. She is not diaphoretic.  Psychiatric: She has a normal mood and affect. Her behavior is normal. Judgment and thought content normal.  Pt declines pelvic exam, prefers to perform self-swabs for evaluation of discharge.  Results for orders  placed or performed during the hospital encounter of 06/11/19 (from the past 24 hour(s))  Urinalysis, Routine w reflex microscopic     Status: Abnormal   Collection Time: 06/12/19  1:01 AM  Result Value Ref Range   Color, Urine YELLOW YELLOW   APPearance CLEAR CLEAR   Specific Gravity, Urine 1.014 1.005 - 1.030   pH 6.0 5.0 - 8.0   Glucose, UA NEGATIVE NEGATIVE mg/dL   Hgb urine dipstick NEGATIVE NEGATIVE   Bilirubin Urine NEGATIVE NEGATIVE   Ketones, ur NEGATIVE NEGATIVE mg/dL   Protein, ur NEGATIVE NEGATIVE mg/dL   Nitrite NEGATIVE NEGATIVE   Leukocytes,Ua TRACE (A) NEGATIVE   RBC / HPF 0-5 0 - 5 RBC/hpf   WBC, UA 0-5 0 - 5 WBC/hpf   Bacteria, UA NONE SEEN NONE SEEN   Squamous Epithelial / LPF 0-5 0 - 5   Mucus PRESENT   Pregnancy, urine POC     Status: Abnormal   Collection Time: 06/12/19  1:02 AM  Result Value Ref Range   Preg Test, Ur POSITIVE (A) NEGATIVE  CBC     Status: Abnormal   Collection Time: 06/12/19  2:07 AM  Result Value Ref Range   WBC 12.2 (H) 4.0 - 10.5 K/uL   RBC 3.64 (L) 3.87 - 5.11 MIL/uL   Hemoglobin 10.3 (L) 12.0 - 15.0 g/dL   HCT 18.8 (L) 41.6 - 60.6 %   MCV 90.1 80.0 - 100.0 fL   MCH 28.3 26.0 - 34.0 pg   MCHC 31.4 30.0 - 36.0 g/dL   RDW 30.1 (H) 60.1 - 09.3 %   Platelets 391 150 - 400 K/uL   nRBC 0.0 0.0 - 0.2 %  Comprehensive metabolic panel     Status: Abnormal   Collection Time: 06/12/19  2:07 AM  Result Value Ref Range   Sodium 136 135 - 145 mmol/L   Potassium 4.3 3.5 - 5.1 mmol/L   Chloride 103 98 - 111 mmol/L   CO2 25 22 - 32 mmol/L   Glucose, Bld 97 70 - 99 mg/dL   BUN 9 6 - 20 mg/dL   Creatinine, Ser 2.35 0.44 - 1.00 mg/dL   Calcium 9.2 8.9 - 57.3 mg/dL   Total Protein 6.7 6.5 - 8.1 g/dL   Albumin 3.4 (L) 3.5 - 5.0 g/dL   AST 21 15 - 41 U/L   ALT 21 0 - 44 U/L   Alkaline Phosphatase 83 38 - 126 U/L   Total Bilirubin <0.1 (L) 0.3 - 1.2 mg/dL   GFR calc non Af Amer >60 >60 mL/min   GFR calc Af Amer >60 >60 mL/min   Anion gap 8 5  - 15  hCG, quantitative, pregnancy     Status: Abnormal   Collection Time: 06/12/19  2:07 AM  Result Value Ref Range   hCG, Beta Chain, Quant,  S 138,791 (H) <5 mIU/mL  Wet prep, genital     Status: Abnormal   Collection Time: 06/12/19  2:28 AM  Result Value Ref Range   Yeast Wet Prep HPF POC PRESENT (A) NONE SEEN   Trich, Wet Prep NONE SEEN NONE SEEN   Clue Cells Wet Prep HPF POC PRESENT (A) NONE SEEN   WBC, Wet Prep HPF POC MANY (A) NONE SEEN   Sperm NONE SEEN    US OB Comp Less 14 Wks  Result Date: 06/12/2019 CLINICAL DATA:  Acute bleeding EXAM: OBSTETRIC <14 WK ULTRASOUND TECHNIQUE: Transabdominal ultrasound was performed for evaluation of the gestation as well as the maternal uterus and adnexal regions. COMPARISON:  None. FINDINGS: Intrauterine gestational sac: Single Yolk sac:  Visualized. Embryo:  Visualized. Cardiac Activity: Visualized. Heart Rate: 137 bpm CRL: 7.58 mm   6 w 4 d                  [place] EDC: 02/01/2020 The gestational sac appears to be located low within the endometrial canal. Subchorionic hemorrhage:  None visualized. Maternal uterus/adnexae: The ovaries are unremarkable. There is no significant free fluid in the patient's pelvis. IMPRESSION: Single live IUP at 6 weeks and 4 days as detailed above. No maternal abnormality detected. Electronically Signed   By: Katherine Mantle M.D.   On: 06/12/2019 02:01   MAU Course  Procedures  MDM -r/o ectopic -UA: trace leuks, sending urine for culture based on symptoms -CBC: H/H 10.3/32.8, will start on iron -CMP: WNL -[place]: single IUP, FHR 137, [redacted]w[redacted]d, no SCH, gestational sac appears to be located low within the endometrial canal. -hCG: pending at time of discharge -ABO: O Positive -WetPrep: +yeast, +ClueCells -GC/CT collected -pt discharged to home in stable condition  Orders Placed This Encounter  Procedures  . Wet prep, genital    Standing Status:   Standing    Number of Occurrences:   1  . Culture, OB Urine     Standing Status:   Standing    Number of Occurrences:   1  . US OB Comp Less 14 Wks    Standing Status:   Standing    Number of Occurrences:   1    Order Specific Question:   Symptom/Reason for Exam    Answer:   Abdominal pain in pregnancy [[id number]]  . CBC    Standing Status:   Standing    Number of Occurrences:   1  . Comprehensive metabolic panel    Standing Status:   Standing    Number of Occurrences:   1  . hCG, quantitative, pregnancy    Standing Status:   Standing    Number of Occurrences:   1  . Urinalysis, Routine w reflex microscopic    Standing Status:   Standing    Number of Occurrences:   1  . Pregnancy, urine POC    Standing Status:   Standing    Number of Occurrences:   1  . Discharge patient    Order Specific Question:   Discharge disposition    Answer:   01-Home or Self Care [1]    Order Specific Question:   Discharge patient date    Answer:   06/12/2019   Assessment and Plan   1. Abdominal pain in pregnancy   2. [redacted] weeks gestation of pregnancy   3. Blood type, Rh positive   4. Vaginal yeast infection   5. Anemia during pregnancy in first trimester   6. Bacterial  vaginosis    Allergies as of 06/12/2019   No Known Allergies     Medication List    TAKE these medications   ferrous sulfate 325 (65 FE) MG tablet Take 1 tablet (325 mg total) by mouth daily.   metroNIDAZOLE 0.75 % vaginal gel Commonly known as: METROGEL VAGINAL Place 1 Applicatorful vaginally at bedtime for 5 days.   terconazole 0.4 % vaginal cream Commonly known as: TERAZOL 7 Place 1 applicator vaginally at bedtime for 7 days.      -will call with culture results, if positive -RX  Terconazole -RX iron -RX metronidazole -discussed order of administration of medications -discussed significance of low placement of GS in uterus, pt advised could be adherent to C/S scar, could represent impending miscarriage -bleeding/pain/N/V/return MAU precautions given -pt discharged to home in  stable condition  Joni Reining E Lafaye Mcelmurry 06/12/2019, 3:45 AM

## 2019-06-12 NOTE — MAU Note (Signed)
PT SAYS SHE DID HPT IN DEC- POSITIVE. HAS ABD PAIN - STARTED AT 0900.  [facility] D/C - STARTED  Tuesday.  VAGINA IS IRRITATED WITH D/C.

## 2019-06-12 NOTE — ED Notes (Signed)
Patient evaluated by Melvenia Beam PA cleared to be transferred to MAU , report given to MAU RN , transporter notified .

## 2019-06-12 NOTE — Discharge Instructions (Signed)
Safe Medications in Pregnancy    Acne: Benzoyl Peroxide Salicylic Acid  Backache/Headache: Tylenol: 2 regular strength every 4 hours OR              2 Extra strength every 6 hours  Colds/Coughs/Allergies: Benadryl (alcohol free) 25 mg every 6 hours as needed Breath right strips Claritin Cepacol throat lozenges Chloraseptic throat spray Cold-Eeze- up to three times per day Cough drops, alcohol free Flonase (by prescription only) Guaifenesin Mucinex Robitussin DM (plain only, alcohol free) Saline nasal spray/drops Sudafed (pseudoephedrine) & Actifed ** use only after [redacted] weeks gestation and if you do not have high blood pressure Tylenol Vicks Vaporub Zinc lozenges Zyrtec   Constipation: Colace Ducolax suppositories Fleet enema Glycerin suppositories Metamucil Milk of magnesia Miralax Senokot Smooth move tea  Diarrhea: Kaopectate Imodium A-D  *NO pepto Bismol  Hemorrhoids: Anusol Anusol HC Preparation H Tucks  Indigestion: Tums Maalox Mylanta Zantac  Pepcid  Insomnia: Benadryl (alcohol free) 25mg  every 6 hours as needed Tylenol PM Unisom, no Gelcaps  Leg Cramps: Tums MagGel  Nausea/Vomiting:  Bonine Dramamine Emetrol Ginger extract Sea bands Meclizine  Nausea medication to take during pregnancy:  Unisom (doxylamine succinate 25 mg tablets) Take one tablet daily at bedtime. If symptoms are not adequately controlled, the dose can be increased to a maximum recommended dose of two tablets daily (1/2 tablet in the morning, 1/2 tablet mid-afternoon and one at bedtime). Vitamin B6 100mg  tablets. Take one tablet twice a day (up to 200 mg per day).  Skin Rashes: Aveeno products Benadryl cream or 25mg  every 6 hours as needed Calamine Lotion 1% cortisone cream  Yeast infection: Gyne-lotrimin 7 Monistat 7   **If taking multiple medications, please check labels to avoid duplicating the same active ingredients **take  medication as directed on the label ** Do not exceed 4000 mg of tylenol in 24 hours **Do not take medications that contain aspirin or ibuprofen      Abdominal Pain During Pregnancy  Abdominal pain is common during pregnancy, and has many possible causes. Some causes are more serious than others, and sometimes the cause is not known. Abdominal pain can be a sign that labor is starting. It can also be caused by normal growth and stretching of muscles and ligaments during pregnancy. Always tell your health care provider if you have any abdominal pain. Follow these instructions at home:  Do not have sex or put anything in your vagina until your pain goes away completely.  Get plenty of rest until your pain improves.  Drink enough fluid to keep your urine pale yellow.  Take over-the-counter and prescription medicines only as told by your health care provider.  Keep all follow-up visits as told by your health care provider. This is important. Contact a health care provider if:  Your pain continues or gets worse after resting.  You have lower abdominal pain that: ? Comes and goes at regular intervals. ? Spreads to your back. ? Is similar to menstrual cramps.  You have pain or burning when you urinate. Get help right away if:  You have a fever or chills.  You have vaginal bleeding.  You are leaking fluid from your vagina.  You are passing tissue from your vagina.  You have vomiting or diarrhea that lasts for more than 24 hours.  Your baby is moving less than usual.  You feel very weak or faint.  You have shortness of breath.  You develop severe pain in your upper abdomen. Summary  Abdominal  pain is common during pregnancy, and has many possible causes.  If you experience abdominal pain during pregnancy, tell your health care provider right away.  Follow your health care provider's home care instructions and keep all follow-up visits as directed. This information is  not intended to replace advice given to you by your health care provider. Make sure you discuss any questions you have with your health care provider. Document Revised: 08/24/2018 Document Reviewed: 08/08/2016 Elsevier Patient Education  2020 Elsevier Inc.  Bacterial Vaginosis  Bacterial vaginosis is a vaginal infection that occurs when the normal balance of bacteria in the vagina is disrupted. It results from an overgrowth of certain bacteria. This is the most common vaginal infection among women ages 2215-44. Because bacterial vaginosis increases your risk for STIs (sexually transmitted infections), getting treated can help reduce your risk for chlamydia, gonorrhea, herpes, and HIV (human immunodeficiency virus). Treatment is also important for preventing complications in pregnant women, because this condition can cause an early (premature) delivery. What are the causes? This condition is caused by an increase in harmful bacteria that are normally present in small amounts in the vagina. However, the reason that the condition develops is not fully understood. What increases the risk? The following factors may make you more likely to develop this condition:  Having a new sexual partner or multiple sexual partners.  Having unprotected sex.  Douching.  Having an intrauterine device (IUD).  Smoking.  Drug and alcohol abuse.  Taking certain antibiotic medicines.  Being pregnant. You cannot get bacterial vaginosis from toilet seats, bedding, swimming pools, or contact with objects around you. What are the signs or symptoms? Symptoms of this condition include:  Grey or white vaginal discharge. The discharge can also be watery or foamy.  A fish-like odor with discharge, especially after sexual intercourse or during menstruation.  Itching in and around the vagina.  Burning or pain with urination. Some women with bacterial vaginosis have no signs or symptoms. How is this  diagnosed? This condition is diagnosed based on:  Your medical history.  A physical exam of the vagina.  Testing a sample of vaginal fluid under a microscope to look for a large amount of bad bacteria or abnormal cells. Your health care provider may use a cotton swab or a small wooden spatula to collect the sample. How is this treated? This condition is treated with antibiotics. These may be given as a pill, a vaginal cream, or a medicine that is put into the vagina (suppository). If the condition comes back after treatment, a second round of antibiotics may be needed. Follow these instructions at home: Medicines  Take over-the-counter and prescription medicines only as told by your health care provider.  Take or use your antibiotic as told by your health care provider. Do not stop taking or using the antibiotic even if you start to feel better. General instructions  If you have a female sexual partner, tell her that you have a vaginal infection. She should see her health care provider and be treated if she has symptoms. If you have a female sexual partner, he does not need treatment.  During treatment: ? Avoid sexual activity until you finish treatment. ? Do not douche. ? Avoid alcohol as directed by your health care provider. ? Avoid breastfeeding as directed by your health care provider.  Drink enough water and fluids to keep your urine clear or pale yellow.  Keep the area around your vagina and rectum clean. ? Wash the area  daily with warm water. ? Wipe yourself from front to back after using the toilet.  Keep all follow-up visits as told by your health care provider. This is important. How is this prevented?  Do not douche.  Wash the outside of your vagina with warm water only.  Use protection when having sex. This includes latex condoms and dental dams.  Limit how many sexual partners you have. To help prevent bacterial vaginosis, it is best to have sex with just one  partner (monogamous).  Make sure you and your sexual partner are tested for STIs.  Wear cotton or cotton-lined underwear.  Avoid wearing tight pants and pantyhose, especially during summer.  Limit the amount of alcohol that you drink.  Do not use any products that contain nicotine or tobacco, such as cigarettes and e-cigarettes. If you need help quitting, ask your health care provider.  Do not use illegal drugs. Where to find more information  Centers for Disease Control and Prevention: SolutionApps.co.za  American Sexual Health Association (ASHA): www.ashastd.org  [place] Department of Health and Health and safety inspector, Office on Women's Health: ConventionalMedicines.si or http://www.anderson-williamson.info/ Contact a health care provider if:  Your symptoms do not improve, even after treatment.  You have more discharge or pain when urinating.  You have a fever.  You have pain in your abdomen.  You have pain during sex.  You have vaginal bleeding between periods. Summary  Bacterial vaginosis is a vaginal infection that occurs when the normal balance of bacteria in the vagina is disrupted.  Because bacterial vaginosis increases your risk for STIs (sexually transmitted infections), getting treated can help reduce your risk for chlamydia, gonorrhea, herpes, and HIV (human immunodeficiency virus). Treatment is also important for preventing complications in pregnant women, because the condition can cause an early (premature) delivery.  This condition is treated with antibiotic medicines. These may be given as a pill, a vaginal cream, or a medicine that is put into the vagina (suppository). This information is not intended to replace advice given to you by your health care provider. Make sure you discuss any questions you have with your health care provider. Document Revised: 04/18/2017 Document Reviewed: 01/20/2016 Elsevier Patient Education  2020 Elsevier Inc.  Vaginal  Yeast Infection, Adult  Vaginal yeast infection is a condition that causes vaginal discharge as well as soreness, swelling, and redness (inflammation) of the vagina. This is a common condition. Some women get this infection frequently. What are the causes? This condition is caused by a change in the normal balance of the yeast (candida) and bacteria that live in the vagina. This change causes an overgrowth of yeast, which causes the inflammation. What increases the risk? The condition is more likely to develop in women who:  Take antibiotic medicines.  Have diabetes.  Take birth control pills.  Are pregnant.  Douche often.  Have a weak body defense system (immune system).  Have been taking steroid medicines for a long time.  Frequently wear tight clothing. What are the signs or symptoms? Symptoms of this condition include:  White, thick, creamy vaginal discharge.  Swelling, itching, redness, and irritation of the vagina. The lips of the vagina (vulva) may be affected as well.  Pain or a burning feeling while urinating.  Pain during sex. How is this diagnosed? This condition is diagnosed based on:  Your medical history.  A physical exam.  A pelvic exam. Your health care provider will examine a sample of your vaginal discharge under a microscope. Your health care provider  may send this sample for testing to confirm the diagnosis. How is this treated? This condition is treated with medicine. Medicines may be over-the-counter or prescription. You may be told to use one or more of the following:  Medicine that is taken by mouth (orally).  Medicine that is applied as a cream (topically).  Medicine that is inserted directly into the vagina (suppository). Follow these instructions at home:  Lifestyle  Do not have sex until your health care provider approves. Tell your sex partner that you have a yeast infection. That person should go to his or her health care provider and  ask if they should also be treated.  Do not wear tight clothes, such as pantyhose or tight pants.  Wear breathable cotton underwear. General instructions  Take or apply over-the-counter and prescription medicines only as told by your health care provider.  Eat more yogurt. This may help to keep your yeast infection from returning.  Do not use tampons until your health care provider approves.  Try taking a sitz bath to help with discomfort. This is a warm water bath that is taken while you are sitting down. The water should only come up to your hips and should cover your buttocks. Do this 3-4 times per day or as told by your health care provider.  Do not douche.  If you have diabetes, keep your blood sugar levels under control.  Keep all follow-up visits as told by your health care provider. This is important. Contact a health care provider if:  You have a fever.  Your symptoms go away and then return.  Your symptoms do not get better with treatment.  Your symptoms get worse.  You have new symptoms.  You develop blisters in or around your vagina.  You have blood coming from your vagina and it is not your menstrual period.  You develop pain in your abdomen. Summary  Vaginal yeast infection is a condition that causes discharge as well as soreness, swelling, and redness (inflammation) of the vagina.  This condition is treated with medicine. Medicines may be over-the-counter or prescription.  Take or apply over-the-counter and prescription medicines only as told by your health care provider.  Do not douche. Do not have sex or use tampons until your health care provider approves.  Contact a health care provider if your symptoms do not get better with treatment or your symptoms go away and then return. This information is not intended to replace advice given to you by your health care provider. Make sure you discuss any questions you have with your health care  provider. Document Revised: 12/04/2018 Document Reviewed: 09/22/2017 Elsevier Patient Education  2020 [employer]  Pregnancy and Anemia  Anemia is a condition in which the concentration of red blood cells, or hemoglobin, in the blood is below normal. Hemoglobin is a substance in red blood cells that carries oxygen to the tissues of the body. Anemia results when enough oxygen does not reach these tissues. Anemia is common during pregnancy because the woman's body needs more blood volume and blood cells to provide nutrition to the fetus. The fetus needs iron and folic acid as it is developing. Your body may not produce enough red blood cells because of this. Also, during pregnancy, the liquid part of the blood (plasma) increases by about 30-50%, and the red blood cells increase by only 20%. This lowers the concentration of the red blood cells and creates a natural anemia-like situation. What are the causes? The most  common cause of anemia during pregnancy is not having enough iron in the body to make red blood cells (iron deficiency anemia). Other causes may include:  Folic acid deficiency.  Vitamin B12 deficiency.  Certain prescription or over-the-counter medicines.  Certain medical conditions or infections that destroy red blood cells.  A low platelet count and bleeding caused by antibodies that go through the placenta to the fetus from the mother's blood. What are the signs or symptoms? Mild anemia may not be noticeable. If it becomes severe, symptoms may include:  Feeling tired (fatigue).  Shortness of breath, especially during activity.  Weakness.  Fainting.  Pale looking skin.  Headaches.  A fast or irregular heartbeat (palpitations).  Dizziness. How is this diagnosed? This condition may be diagnosed based on:  Your medical history and a physical exam.  Blood tests. How is this treated? Treatment for anemia during pregnancy depends on the cause of the anemia.  Treatment can include:  Dietary changes.  Supplements of iron, vitamin B12, or folic acid.  A blood transfusion. This may be needed if anemia is severe.  Hospitalization. This may be needed if there is a lot of blood loss or severe anemia. Follow these instructions at home:  Follow recommendations from your dietitian or health care provider about changing your diet.  Increase your vitamin C intake. This will help the stomach absorb more iron. Some foods that are high in vitamin C include: ? Oranges. ? Peppers. ? Tomatoes. ? Mangoes.  Eat a diet rich in iron. This would include foods such as: ? Liver. ? Beef. ? Eggs. ? Whole grains. ? Spinach. ? Dried fruit.  Take iron and vitamins as told by your health care provider.  Eat green leafy vegetables. These are a good source of folic acid.  Keep all follow-up visits as told by your health care provider. This is important. Contact a health care provider if:  You have frequent or lasting headaches.  You look pale.  You bruise easily. Get help right away if:  You have extreme weakness, shortness of breath, or chest pain.  You become dizzy or have trouble concentrating.  You have heavy vaginal bleeding.  You develop a rash.  You have bloody or black, tarry stools.  You faint.  You vomit up blood.  You vomit repeatedly.  You have abdominal pain.  You have a fever.  You are dehydrated. Summary  Anemia is a condition in which the concentration of red blood cells or hemoglobin in the blood is below normal.  Anemia is common during pregnancy because the woman's body needs more blood volume and blood cells to provide nutrition to the fetus.  The most common cause of anemia during pregnancy is not having enough iron in the body to make red blood cells (iron deficiency anemia).  Mild anemia may not be noticeable. If it becomes severe, symptoms may include feeling tired and weak. This information is not intended  to replace advice given to you by your health care provider. Make sure you discuss any questions you have with your health care provider. Document Revised: 10/20/2018 Document Reviewed: 06/11/2016 Elsevier Patient Education  2020 [employer].  First Trimester of Pregnancy The first trimester of pregnancy is from week 1 until the end of week 13 (months 1 through 3). A week after a sperm fertilizes an egg, the egg will implant on the wall of the uterus. This embryo will begin to develop into a baby. Genes from you and your partner  will form the baby. The female genes will determine whether the baby will be a boy or a girl. At 6-8 weeks, the eyes and face will be formed, and the heartbeat can be seen on ultrasound. At the end of 12 weeks, all the baby's organs will be formed. Now that you are pregnant, you will want to do everything you can to have a healthy baby. Two of the most important things are to get good prenatal care and to follow your health care provider's instructions. Prenatal care is all the medical care you receive before the baby's birth. This care will help prevent, find, and treat any problems during the pregnancy and childbirth. Body changes during your first trimester Your body goes through many changes during pregnancy. The changes vary from woman to woman.  You may gain or lose a couple of pounds at first.  You may feel sick to your stomach (nauseous) and you may throw up (vomit). If the vomiting is uncontrollable, call your health care provider.  You may tire easily.  You may develop headaches that can be relieved by medicines. All medicines should be approved by your health care provider.  You may urinate more often. Painful urination may mean you have a bladder infection.  You may develop heartburn as a result of your pregnancy.  You may develop constipation because certain hormones are causing the muscles that push stool through your intestines to slow down.  You may  develop hemorrhoids or swollen veins (varicose veins).  Your breasts may begin to grow larger and become tender. Your nipples may stick out more, and the tissue that surrounds them (areola) may become darker.  Your gums may bleed and may be sensitive to brushing and flossing.  Dark spots or blotches (chloasma, mask of pregnancy) may develop on your face. This will likely fade after the baby is born.  Your menstrual periods will stop.  You may have a loss of appetite.  You may develop cravings for certain kinds of food.  You may have changes in your emotions from day to day, such as being excited to be pregnant or being concerned that something may go wrong with the pregnancy and baby.  You may have more vivid and strange dreams.  You may have changes in your hair. These can include thickening of your hair, rapid growth, and changes in texture. Some women also have hair loss during or after pregnancy, or hair that feels dry or thin. Your hair will most likely return to normal after your baby is born. What to expect at prenatal visits During a routine prenatal visit:  You will be weighed to make sure you and the baby are growing normally.  Your blood pressure will be taken.  Your abdomen will be measured to track your baby's growth.  The fetal heartbeat will be listened to between weeks 10 and 14 of your pregnancy.  Test results from any previous visits will be discussed. Your health care provider may ask you:  How you are feeling.  If you are feeling the baby move.  If you have had any abnormal symptoms, such as leaking fluid, bleeding, severe headaches, or abdominal cramping.  If you are using any tobacco products, including cigarettes, chewing tobacco, and electronic cigarettes.  If you have any questions. Other tests that may be performed during your first trimester include:  Blood tests to find your blood type and to check for the presence of any previous infections.  The tests will also  be used to check for low iron levels (anemia) and protein on red blood cells (Rh antibodies). Depending on your risk factors, or if you previously had diabetes during pregnancy, you may have tests to check for high blood sugar that affects pregnant women (gestational diabetes).  Urine tests to check for infections, diabetes, or protein in the urine.  An ultrasound to confirm the proper growth and development of the baby.  Fetal screens for spinal cord problems (spina bifida) and Down syndrome.  HIV (human immunodeficiency virus) testing. Routine prenatal testing includes screening for HIV, unless you choose not to have this test.  You may need other tests to make sure you and the baby are doing well. Follow these instructions at home: Medicines  Follow your health care provider's instructions regarding medicine use. Specific medicines may be either safe or unsafe to take during pregnancy.  Take a prenatal vitamin that contains at least 600 micrograms (mcg) of folic acid.  If you develop constipation, try taking a stool softener if your health care provider approves. Eating and drinking   Eat a balanced diet that includes fresh fruits and vegetables, whole grains, good sources of protein such as meat, eggs, or tofu, and low-fat dairy. Your health care provider will help you determine the amount of weight gain that is right for you.  Avoid raw meat and uncooked cheese. These carry germs that can cause birth defects in the baby.  Eating four or five small meals rather than three large meals a day may help relieve nausea and vomiting. If you start to feel nauseous, eating a few soda crackers can be helpful. Drinking liquids between meals, instead of during meals, also seems to help ease nausea and vomiting.  Limit foods that are high in fat and processed sugars, such as fried and sweet foods.  To prevent constipation: ? Eat foods that are high in fiber, such as fresh  fruits and vegetables, whole grains, and beans. ? Drink enough fluid to keep your urine clear or pale yellow. Activity  Exercise only as directed by your health care provider. Most women can continue their usual exercise routine during pregnancy. Try to exercise for 30 minutes at least 5 days a week. Exercising will help you: ? Control your weight. ? Stay in shape. ? Be prepared for labor and delivery.  Experiencing pain or cramping in the lower abdomen or lower back is a good sign that you should stop exercising. Check with your health care provider before continuing with normal exercises.  Try to avoid standing for long periods of time. Move your legs often if you must stand in one place for a long time.  Avoid heavy lifting.  Wear low-heeled shoes and practice good posture.  You may continue to have sex unless your health care provider tells you not to. Relieving pain and discomfort  Wear a good support bra to relieve breast tenderness.  Take warm sitz baths to soothe any pain or discomfort caused by hemorrhoids. Use hemorrhoid cream if your health care provider approves.  Rest with your legs elevated if you have leg cramps or low back pain.  If you develop varicose veins in your legs, wear support hose. Elevate your feet for 15 minutes, 3-4 times a day. Limit salt in your diet. Prenatal care  Schedule your prenatal visits by the twelfth week of pregnancy. They are usually scheduled monthly at first, then more often in the last 2 months before delivery.  Write down your questions. Take  them to your prenatal visits.  Keep all your prenatal visits as told by your health care provider. This is important. Safety  Wear your seat belt at all times when driving.  Make a list of emergency phone numbers, including numbers for family, friends, the hospital, and police and fire departments. General instructions  Ask your health care provider for a referral to a local prenatal  education class. Begin classes no later than the beginning of month 6 of your pregnancy.  Ask for help if you have counseling or nutritional needs during pregnancy. Your health care provider can offer advice or refer you to specialists for help with various needs.  Do not use hot tubs, steam rooms, or saunas.  Do not douche or use tampons or scented sanitary pads.  Do not cross your legs for long periods of time.  Avoid cat litter boxes and soil used by cats. These carry germs that can cause birth defects in the baby and possibly loss of the fetus by miscarriage or stillbirth.  Avoid all smoking, herbs, alcohol, and medicines not prescribed by your health care provider. Chemicals in these products affect the formation and growth of the baby.  Do not use any products that contain nicotine or tobacco, such as cigarettes and e-cigarettes. If you need help quitting, ask your health care provider. You may receive counseling support and other resources to help you quit.  Schedule a dentist appointment. At home, brush your teeth with a soft toothbrush and be gentle when you floss. Contact a health care provider if:  You have dizziness.  You have mild pelvic cramps, pelvic pressure, or nagging pain in the abdominal area.  You have persistent nausea, vomiting, or diarrhea.  You have a bad smelling vaginal discharge.  You have pain when you urinate.  You notice increased swelling in your face, hands, legs, or ankles.  You are exposed to fifth disease or chickenpox.  You are exposed to [place] measles (rubella) and have never had it. Get help right away if:  You have a fever.  You are leaking fluid from your vagina.  You have spotting or bleeding from your vagina.  You have severe abdominal cramping or pain.  You have rapid weight gain or loss.  You vomit blood or material that looks like coffee grounds.  You develop a severe headache.  You have shortness of breath.  You have  any kind of trauma, such as from a fall or a car accident. Summary  The first trimester of pregnancy is from week 1 until the end of week 13 (months 1 through 3).  Your body goes through many changes during pregnancy. The changes vary from woman to woman.  You will have routine prenatal visits. During those visits, your health care provider will examine you, discuss any test results you may have, and talk with you about how you are feeling. This information is not intended to replace advice given to you by your health care provider. Make sure you discuss any questions you have with your health care provider. Document Revised: 04/18/2017 Document Reviewed: 04/17/2016 Elsevier Patient Education  2020 [employer].

## 2019-06-13 LAB — CULTURE, OB URINE

## 2019-06-14 LAB — GC/CHLAMYDIA PROBE AMP (~~LOC~~) NOT AT ARMC
Chlamydia: NEGATIVE
Comment: NEGATIVE
Comment: NORMAL
Neisseria Gonorrhea: NEGATIVE

## 2019-07-01 ENCOUNTER — Other Ambulatory Visit: Payer: Self-pay

## 2019-07-01 ENCOUNTER — Inpatient Hospital Stay (HOSPITAL_COMMUNITY)
Admission: AD | Admit: 2019-07-01 | Discharge: 2019-07-02 | Disposition: A | Discharge status: Home / Self Care | Payer: Medicaid Other | Attending: Obstetrics and Gynecology | Admitting: Obstetrics and Gynecology

## 2019-07-01 DIAGNOSIS — Z87891 Personal history of nicotine dependence: Secondary | ICD-10-CM | POA: Diagnosis not present

## 2019-07-01 DIAGNOSIS — O99891 Other specified diseases and conditions complicating pregnancy: Secondary | ICD-10-CM | POA: Insufficient documentation

## 2019-07-01 DIAGNOSIS — Z3A10 10 weeks gestation of pregnancy: Secondary | ICD-10-CM | POA: Diagnosis not present

## 2019-07-01 DIAGNOSIS — K0889 Other specified disorders of teeth and supporting structures: Secondary | ICD-10-CM | POA: Diagnosis not present

## 2019-07-01 DIAGNOSIS — F329 Major depressive disorder, single episode, unspecified: Secondary | ICD-10-CM | POA: Diagnosis not present

## 2019-07-01 DIAGNOSIS — D649 Anemia, unspecified: Secondary | ICD-10-CM | POA: Diagnosis not present

## 2019-07-01 DIAGNOSIS — O99011 Anemia complicating pregnancy, first trimester: Secondary | ICD-10-CM | POA: Insufficient documentation

## 2019-07-01 DIAGNOSIS — O09211 Supervision of pregnancy with history of pre-term labor, first trimester: Secondary | ICD-10-CM | POA: Insufficient documentation

## 2019-07-01 DIAGNOSIS — Z79899 Other long term (current) drug therapy: Secondary | ICD-10-CM | POA: Insufficient documentation

## 2019-07-01 DIAGNOSIS — O34219 Maternal care for unspecified type scar from previous cesarean delivery: Secondary | ICD-10-CM | POA: Diagnosis not present

## 2019-07-01 DIAGNOSIS — O99341 Other mental disorders complicating pregnancy, first trimester: Secondary | ICD-10-CM | POA: Diagnosis not present

## 2019-07-01 DIAGNOSIS — O26891 Other specified pregnancy related conditions, first trimester: Secondary | ICD-10-CM

## 2019-07-01 MED ORDER — OXYCODONE HCL 5 MG PO TABS
5.0000 mg | ORAL_TABLET | Freq: Once | ORAL | Status: AC
  Administered 2019-07-01: 5 mg via ORAL
  Filled 2019-07-01: qty 1

## 2019-07-01 MED ORDER — OXYCODONE HCL 5 MG PO TABS: 5.0000 mg | ORAL_TABLET | Freq: Four times a day (QID) | ORAL | 0 refills | Status: DC | PRN

## 2019-07-01 NOTE — MAU Provider Note (Signed)
Chief Complaint: Dental Pain   First Provider Initiated Contact with Patient 07/01/19 2332      SUBJECTIVE HPI: Heather Lynch is a 34 y.o. [id number] at [redacted]w[redacted]d who presents to maternity admissions reporting left sided tooth pain. Pain started 4 days ago and has been intermittently throbbing. It kept her up tonight so she decided to come to MAU. She missed class yesterday due to pain. She has tried Orajel and Tylenol with minimal relief. She finally found a dentist and has an appt tomorrow morning. Reports hx of caries. Denies fever or chills but reports pain does radiate to left ear and upper/lower jaw on left side. She is unsure if it hurts more on upper or lower side of jaw. Denies throat pain. She is still tolerating a regular diet and chewing food on right side of mouth.  She denies vaginal bleeding, vaginal itching/burning, urinary symptoms, h/a, dizziness, n/v, or fever/chills.    Past Medical History:  Diagnosis Date  . Anemia   . Depression    refuses to take meds  . Gonorrhea   . Heart murmur   . Pneumonia   . Preterm delivery 10/20/2018  . Spontaneous abortion 10/20/2018  . Urinary tract infection    Past Surgical History:  Procedure Laterality Date  . CESAREAN SECTION     X 5  . DILATION AND CURETTAGE OF UTERUS N/A 10/20/2018   Procedure: DILATATION AND CURETTAGE;  Surgeon: Adam Phenix, MD;  Location: [facility];  Service: Gynecology;  Laterality: N/A;   Social History   Socioeconomic History  . Marital status: Single    Spouse name: Not on file  . Number of children: Not on file  . Years of education: Not on file  . Highest education level: Not on file  Occupational History  . Not on file  Tobacco Use  . Smoking status: Former Smoker    Quit date: 12/30/2011    Years since quitting: 7.5  . Smokeless tobacco: Never Used  Substance and Sexual Activity  . Alcohol use: Yes    Comment: occasional  . Drug use: Yes    Types: Marijuana    Comment: LAST SMOKED - NOV   . Sexual activity: Yes    Partners: Male    Birth control/protection: None  Other Topics Concern  . Not on file  Social History Narrative  . Not on file   Social Determinants of Health   Financial Resource Strain:   . Difficulty of Paying Living Expenses: Not on file  Food Insecurity:   . Worried About Programme researcher, broadcasting/film/video in the Last Year: Not on file  . Ran Out of Food in the Last Year: Not on file  Transportation Needs:   . Lack of Transportation (Medical): Not on file  . Lack of Transportation (Non-Medical): Not on file  Physical Activity:   . Days of Exercise per Week: Not on file  . Minutes of Exercise per Session: Not on file  Stress:   . Feeling of Stress : Not on file  Social Connections:   . Frequency of Communication with Friends and Family: Not on file  . Frequency of Social Gatherings with Friends and Family: Not on file  . Attends Religious Services: Not on file  . Active Member of Clubs or Organizations: Not on file  . Attends Banker Meetings: Not on file  . Marital Status: Not on file  Intimate Partner Violence:   . Fear of Current or Ex-Partner: Not on  file  . Emotionally Abused: Not on file  . Physically Abused: Not on file  . Sexually Abused: Not on file   No current facility-administered medications on file prior to encounter.   Current Outpatient Medications on File Prior to Encounter  Medication Sig Dispense Refill  . ferrous sulfate 325 (65 FE) MG tablet Take 1 tablet (325 mg total) by mouth daily. 30 tablet 3   No Known Allergies  ROS:  Review of Systems All other systems negative unless noted above in HPI.   I have reviewed patient's Past Medical Hx, Surgical Hx, Family Hx, Social Hx, medications and allergies.   Physical Exam   Patient Vitals for the past 24 hrs:  BP Temp Pulse Resp Height Weight  07/01/19 2326 139/84 -- 99 -- -- --  07/01/19 2322 -- 98.9 F (37.2 C) -- 20 5\' 8"  (1.727 m) (!) 144.2 kg   Constitutional:  Well-developed, well-nourished female in no acute distress.  HEENT: EOMI. Nares patent. Mandible non-tender to palpation. Thorough examination of gumline and surrounding soft tissue of left mandible and maxilla without evidence of fluid collection, warmth or erythema.  Cardiovascular: normal rate Respiratory: normal effort GI: Abd soft, non-tender.  MS: Extremities nontender, no edema, normal ROM Neurologic: Alert and oriented x 4.   LAB RESULTS No results found for this or any previous visit (from the past 24 hour(s)).  --/--/O POS (06/02 0404)  IMAGING 05-10-1981 OB Comp Less 14 Wks  Result Date: 06/12/2019 CLINICAL DATA:  Acute bleeding EXAM: OBSTETRIC <14 WK ULTRASOUND TECHNIQUE: Transabdominal ultrasound was performed for evaluation of the gestation as well as the maternal uterus and adnexal regions. COMPARISON:  None. FINDINGS: Intrauterine gestational sac: Single Yolk sac:  Visualized. Embryo:  Visualized. Cardiac Activity: Visualized. Heart Rate: 137 bpm CRL: 7.58 mm   6 w 4 d                  06/14/2019 EDC: 02/01/2020 The gestational sac appears to be located low within the endometrial canal. Subchorionic hemorrhage:  None visualized. Maternal uterus/adnexae: The ovaries are unremarkable. There is no significant free fluid in the patient's pelvis. IMPRESSION: Single live IUP at 6 weeks and 4 days as detailed above. No maternal abnormality detected. Electronically Signed   By: 02/03/2020 M.D.   On: 06/12/2019 02:01    MAU Management/MDM: Orders Placed This Encounter  Procedures  . Discharge patient    Meds ordered this encounter  Medications  . oxyCODONE (Oxy IR/ROXICODONE) immediate release tablet 5 mg  . oxyCODONE (OXY IR/ROXICODONE) 5 MG immediate release tablet    Sig: Take 1 tablet (5 mg total) by mouth every 6 (six) hours as needed for severe pain.    Dispense:  8 tablet    Refill:  0    Patient presented with left-sided dental pain. Vitals stable. No evidence of abscess on  exam. Patient was given Oxycodone and also prescribed a few tabs on discharge. She has an appt tomorrow morning with her dentist for further evaluation. Denied any OB complaints. Pt discharged with strict return precautions.  ASSESSMENT 1. Toothache     PLAN Discharge home Allergies as of 07/01/2019   No Known Allergies     Medication List    TAKE these medications   ferrous sulfate 325 (65 FE) MG tablet Take 1 tablet (325 mg total) by mouth daily.   oxyCODONE 5 MG immediate release tablet Commonly known as: Oxy IR/ROXICODONE Take 1 tablet (5 mg total) by mouth every  6 (six) hours as needed for severe pain.        Barrington Ellison, MD [facility] Family Medicine Fellow, [facility] for [facility] 07/01/2019  11:47 PM

## 2019-07-01 NOTE — MAU Note (Addendum)
Dr Morene Antu in Triage to talk with pt and discuss POC. Pain med given and pt to lobby to wait before leaving.

## 2019-07-01 NOTE — MAU Note (Addendum)
Toothache that has been coming and going. Tonight has gotten worse. On left side and thinks may be upper and lower teeth. L side of face hurts and causing a h/a. Sometimes pain shoots down L jaw. Does have appt dentist tomorrow at 1000. Pain has been going on for 4 days. Tonight got suddenly worse. Denies any pregnancy concerns

## 2019-07-02 NOTE — Progress Notes (Signed)
Written and verbal d/c instructions given and understanding voiced. To keep dental appt this am at 1000

## 2019-08-04 ENCOUNTER — Inpatient Hospital Stay (HOSPITAL_COMMUNITY)
Admission: AD | Admit: 2019-08-04 | Discharge: 2019-08-04 | Disposition: A | Discharge status: Home / Self Care | Payer: Medicaid Other | Attending: Obstetrics & Gynecology | Admitting: Obstetrics & Gynecology

## 2019-08-04 ENCOUNTER — Encounter (HOSPITAL_COMMUNITY): Payer: Self-pay | Admitting: Obstetrics & Gynecology

## 2019-08-04 ENCOUNTER — Other Ambulatory Visit: Payer: Self-pay

## 2019-08-04 DIAGNOSIS — R350 Frequency of micturition: Secondary | ICD-10-CM | POA: Diagnosis not present

## 2019-08-04 DIAGNOSIS — O99891 Other specified diseases and conditions complicating pregnancy: Secondary | ICD-10-CM | POA: Insufficient documentation

## 2019-08-04 DIAGNOSIS — R109 Unspecified abdominal pain: Secondary | ICD-10-CM | POA: Diagnosis not present

## 2019-08-04 DIAGNOSIS — O26892 Other specified pregnancy related conditions, second trimester: Secondary | ICD-10-CM

## 2019-08-04 DIAGNOSIS — R300 Dysuria: Secondary | ICD-10-CM | POA: Diagnosis not present

## 2019-08-04 DIAGNOSIS — B3731 Acute candidiasis of vulva and vagina: Secondary | ICD-10-CM

## 2019-08-04 DIAGNOSIS — Z20822 Contact with and (suspected) exposure to covid-19: Secondary | ICD-10-CM | POA: Diagnosis not present

## 2019-08-04 DIAGNOSIS — B373 Candidiasis of vulva and vagina: Secondary | ICD-10-CM | POA: Insufficient documentation

## 2019-08-04 DIAGNOSIS — Z3A15 15 weeks gestation of pregnancy: Secondary | ICD-10-CM | POA: Insufficient documentation

## 2019-08-04 DIAGNOSIS — Z87891 Personal history of nicotine dependence: Secondary | ICD-10-CM | POA: Diagnosis not present

## 2019-08-04 DIAGNOSIS — O98812 Other maternal infectious and parasitic diseases complicating pregnancy, second trimester: Secondary | ICD-10-CM | POA: Diagnosis not present

## 2019-08-04 DIAGNOSIS — J029 Acute pharyngitis, unspecified: Secondary | ICD-10-CM | POA: Diagnosis present

## 2019-08-04 LAB — GROUP A STREP BY PCR: Group A Strep by PCR: NOT DETECTED

## 2019-08-04 LAB — URINALYSIS, ROUTINE W REFLEX MICROSCOPIC
Bilirubin Urine: NEGATIVE
Glucose, UA: NEGATIVE mg/dL
Hgb urine dipstick: NEGATIVE
Ketones, ur: NEGATIVE mg/dL
Nitrite: NEGATIVE
Protein, ur: NEGATIVE mg/dL
Specific Gravity, Urine: 1.006 (ref 1.005–1.030)
pH: 6 (ref 5.0–8.0)

## 2019-08-04 LAB — WET PREP, GENITAL
Sperm: NONE SEEN
Trich, Wet Prep: NONE SEEN

## 2019-08-04 LAB — CBC WITH DIFFERENTIAL/PLATELET
Abs Immature Granulocytes: 0.04 10*3/uL (ref 0.00–0.07)
Basophils Absolute: 0 10*3/uL (ref 0.0–0.1)
Basophils Relative: 0 %
Eosinophils Absolute: 0.1 10*3/uL (ref 0.0–0.5)
Eosinophils Relative: 1 %
HCT: 30.4 % — ABNORMAL LOW (ref 36.0–46.0)
Hemoglobin: 9.6 g/dL — ABNORMAL LOW (ref 12.0–15.0)
Immature Granulocytes: 0 %
Lymphocytes Relative: 26 %
Lymphs Abs: 2.4 10*3/uL (ref 0.7–4.0)
MCH: 29.3 pg (ref 26.0–34.0)
MCHC: 31.6 g/dL (ref 30.0–36.0)
MCV: 92.7 fL (ref 80.0–100.0)
Monocytes Absolute: 0.8 10*3/uL (ref 0.1–1.0)
Monocytes Relative: 9 %
Neutro Abs: 6.1 10*3/uL (ref 1.7–7.7)
Neutrophils Relative %: 64 %
Platelets: 345 10*3/uL (ref 150–400)
RBC: 3.28 MIL/uL — ABNORMAL LOW (ref 3.87–5.11)
RDW: 16.1 % — ABNORMAL HIGH (ref 11.5–15.5)
WBC: 9.5 10*3/uL (ref 4.0–10.5)
nRBC: 0 % (ref 0.0–0.2)

## 2019-08-04 LAB — SARS CORONAVIRUS 2 (TAT 6-24 HRS): SARS Coronavirus 2: NEGATIVE

## 2019-08-04 MED ORDER — PHENAZOPYRIDINE HCL 200 MG PO TABS: 200.0000 mg | ORAL_TABLET | Freq: Three times a day (TID) | ORAL | 1 refills | Status: DC | PRN

## 2019-08-04 MED ORDER — FAMOTIDINE 20 MG PO TABS: 20.0000 mg | ORAL_TABLET | Freq: Two times a day (BID) | ORAL | 5 refills | Status: DC

## 2019-08-04 NOTE — Progress Notes (Signed)
Sharen Counter CNM discussed dc plan with pt earlier and pt voiced understanding. Written and verbal d/c instructions given and understanding voiced.

## 2019-08-04 NOTE — MAU Note (Signed)
Pt needed to leave and had received verbal d/c instructions from Sharen Counter CNM. CNM was working on pt's d/c papers when got call for emergency in pt room. Pt voiced understanding of verbal d/c info and signed so she could leave.

## 2019-08-04 NOTE — MAU Provider Note (Signed)
Chief Complaint: Sore Throat and Urinary Frequency   First Provider Initiated Contact with Patient 08/04/19 2212      SUBJECTIVE HPI: Heather Lynch is a 34 y.o. [id number] at [redacted]w[redacted]d with hx C/S x 6 and 17 week previable delivery in 2020 who presents to maternity admissions reporting dysuria, urinary frequency, vaginal itching, sore throat, and abdominal cramping. She reports mild throat pain 3-4 days ago, no pain today but sensation that something is in her throat when she swallows. She reports associated heartburn x 3 days.  There is dysuria and frequency x 2-3 days. There is associated vaginal irritation/itching.  She was prescribed Terazol 7 but has not started it yet.  She had UTI/pyelonephritis after her 17 week delivery so she is worried about this again. Her pain is mild, suprapubic/low abdomen cramping that started today.  There are no other symptoms. She has not tried any treatments other than drinking more fluids, mostly cranberry juice.    HPI  Past Medical History:  Diagnosis Date  . Anemia   . Depression    refuses to take meds  . Gonorrhea   . Heart murmur   . Pneumonia   . Preterm delivery 10/20/2018  . Spontaneous abortion 10/20/2018  . Urinary tract infection    Past Surgical History:  Procedure Laterality Date  . CESAREAN SECTION     X 5  . DILATION AND CURETTAGE OF UTERUS N/A 10/20/2018   Procedure: DILATATION AND CURETTAGE;  Surgeon: Woodroe Mode, MD;  Location: [facility] LD [facility]S;  Service: Gynecology;  Laterality: N/A;   Social History   Socioeconomic History  . Marital status: Single    Spouse name: Not on file  . Number of children: Not on file  . Years of education: Not on file  . Highest education level: Not on file  Occupational History  . Not on file  Tobacco Use  . Smoking status: Former Smoker    Quit date: 12/30/2011    Years since quitting: 7.6  . Smokeless tobacco: Never Used  Substance and Sexual Activity  . Alcohol use: Yes    Comment: occasional   . Drug use: Yes    Types: Marijuana    Comment: LAST SMOKED - NOV  . Sexual activity: Yes    Partners: Male    Birth control/protection: None  Other Topics Concern  . Not on file  Social History Narrative  . Not on file   Social Determinants of Health   Financial Resource Strain:   . Difficulty of Paying Living Expenses:   Food Insecurity:   . Worried About Charity fundraiser in the Last Year:   . Arboriculturist in the Last Year:   Transportation Needs:   . Film/video editor (Medical):   Marland Kitchen Lack of Transportation (Non-Medical):   Physical Activity:   . Days of Exercise per Week:   . Minutes of Exercise per Session:   Stress:   . Feeling of Stress :   Social Connections:   . Frequency of Communication with Friends and Family:   . Frequency of Social Gatherings with Friends and Family:   . Attends Religious Services:   . Active Member of Clubs or Organizations:   . Attends Archivist Meetings:   Marland Kitchen Marital Status:   Intimate Partner Violence:   . Fear of Current or Ex-Partner:   . Emotionally Abused:   Marland Kitchen Physically Abused:   . Sexually Abused:    No current facility-administered medications on  file prior to encounter.   Current Outpatient Medications on File Prior to Encounter  Medication Sig Dispense Refill  . ferrous sulfate 325 (65 FE) MG tablet Take 1 tablet (325 mg total) by mouth daily. 30 tablet 3  . oxyCODONE (OXY IR/ROXICODONE) 5 MG immediate release tablet Take 1 tablet (5 mg total) by mouth every 6 (six) hours as needed for severe pain. 8 tablet 0   No Known Allergies  ROS:  Review of Systems   I have reviewed patient's Past Medical Hx, Surgical Hx, Family Hx, Social Hx, medications and allergies.   Physical Exam   Patient Vitals for the past 24 hrs:  BP Temp Pulse Resp Weight  08/04/19 2124 124/76 -- 90 -- --  08/04/19 2123 -- -- -- 18 --  08/04/19 1714 130/79 98.9 F (37.2 C) 98 18 (!) 144.7 kg   Constitutional:  Well-developed, well-nourished female in no acute distress.  EENT:  Throat/tonsils with mild erythema and tiny white patches less than 0.25 cm scattered throughout Cardiovascular: normal rate Respiratory: normal effort GI: Abd soft, non-tender. Pos BS x 4 MS: Extremities nontender, no edema, normal ROM Neurologic: Alert and oriented x 4.  GU: Neg CVAT.   Dilation: Closed Effacement (%): Thick Exam by:: Sharen Counter CNM   FHT 154 by doppler  LAB RESULTS Results for orders placed or performed during the hospital encounter of 08/04/19 (from the past 24 hour(s))  Urinalysis, Routine w reflex microscopic     Status: Abnormal   Collection Time: 08/04/19  5:27 PM  Result Value Ref Range   Color, Urine STRAW (A) YELLOW   APPearance CLEAR CLEAR   Specific Gravity, Urine 1.006 1.005 - 1.030   pH 6.0 5.0 - 8.0   Glucose, UA NEGATIVE NEGATIVE mg/dL   Hgb urine dipstick NEGATIVE NEGATIVE   Bilirubin Urine NEGATIVE NEGATIVE   Ketones, ur NEGATIVE NEGATIVE mg/dL   Protein, ur NEGATIVE NEGATIVE mg/dL   Nitrite NEGATIVE NEGATIVE   Leukocytes,Ua MODERATE (A) NEGATIVE   RBC / HPF 0-5 0 - 5 RBC/hpf   WBC, UA 11-20 0 - 5 WBC/hpf   Bacteria, UA RARE (A) NONE SEEN   Squamous Epithelial / LPF 0-5 0 - 5  CBC with Differential     Status: Abnormal   Collection Time: 08/04/19  6:20 PM  Result Value Ref Range   WBC 9.5 4.0 - 10.5 K/uL   RBC 3.28 (L) 3.87 - 5.11 MIL/uL   Hemoglobin 9.6 (L) 12.0 - 15.0 g/dL   HCT 80.9 (L) 98.3 - 38.2 %   MCV 92.7 80.0 - 100.0 fL   MCH 29.3 26.0 - 34.0 pg   MCHC 31.6 30.0 - 36.0 g/dL   RDW 50.5 (H) 39.7 - 67.3 %   Platelets 345 150 - 400 K/uL   nRBC 0.0 0.0 - 0.2 %   Neutrophils Relative % 64 %   Neutro Abs 6.1 1.7 - 7.7 K/uL   Lymphocytes Relative 26 %   Lymphs Abs 2.4 0.7 - 4.0 K/uL   Monocytes Relative 9 %   Monocytes Absolute 0.8 0.1 - 1.0 K/uL   Eosinophils Relative 1 %   Eosinophils Absolute 0.1 0.0 - 0.5 K/uL   Basophils Relative 0 %    Basophils Absolute 0.0 0.0 - 0.1 K/uL   Immature Granulocytes 0 %   Abs Immature Granulocytes 0.04 0.00 - 0.07 K/uL  Group A Strep by PCR     Status: None   Collection Time: 08/04/19  7:22 PM  Specimen: Throat; Sterile Swab  Result Value Ref Range   Group A Strep by PCR NOT DETECTED NOT DETECTED  Wet prep, genital     Status: Abnormal   Collection Time: 08/04/19  9:22 PM   Specimen: PATH Cytology Cervicovaginal Ancillary Only; Genital  Result Value Ref Range   Yeast Wet Prep HPF POC PRESENT (A) NONE SEEN   Trich, Wet Prep NONE SEEN NONE SEEN   Clue Cells Wet Prep HPF POC PRESENT (A) NONE SEEN   WBC, Wet Prep HPF POC MODERATE (A) NONE SEEN   Sperm NONE SEEN     --/--/O POS (06/02 0404)  IMAGING No results found.  MAU Management/MDM: Orders Placed This Encounter  Procedures  . SARS CORONAVIRUS 2 (TAT 6-24 HRS) Nasopharyngeal Nasopharyngeal Swab  . Group A Strep by PCR  . Wet prep, genital  . Urinalysis, Routine w reflex microscopic  . CBC with Differential  . Discharge patient    Meds ordered this encounter  Medications  . phenazopyridine (PYRIDIUM) 200 MG tablet    Sig: Take 1 tablet (200 mg total) by mouth 3 (three) times daily as needed for pain (urethral spasm).    Dispense:  10 tablet    Refill:  1    Order Specific Question:   Supervising Provider    Answer:   Adam Phenix [[id number]]  . famotidine (PEPCID) 20 MG tablet    Sig: Take 1 tablet (20 mg total) by mouth 2 (two) times daily.    Dispense:  30 tablet    Refill:  5    Order Specific Question:   Supervising Provider    Answer:   Adam Phenix [[id number]]    Pt with heartburn and throat sensations most c/w acid reflux. Rapid strep swab negative. COVID swab pending.  UA with moderate leukocytes but no nitrites.  Consult Dr Despina Hidden given pt hx pyelonephritis and moderate leukocytes.  Send for culture.  Wet prep and symptoms c/w vaginal yeast infection, and this may be causing dysuria as well.  Pt to take Terazol 7 as  prescribed.  No evidence of preterm labor with closed/thick cervix. FHT normal today.  F/U with OB/Gyn office, return to ED or MAU for emergencies.     ASSESSMENT 1. Vaginal candidiasis   2. Dysuria during pregnancy, second trimester   3. Urinary frequency   4. Abdominal pain during pregnancy in second trimester     PLAN Discharge home Allergies as of 08/04/2019   No Known Allergies     Medication List    TAKE these medications   famotidine 20 MG tablet Commonly known as: PEPCID Take 1 tablet (20 mg total) by mouth 2 (two) times daily.   ferrous sulfate 325 (65 FE) MG tablet Take 1 tablet (325 mg total) by mouth daily.   oxyCODONE 5 MG immediate release tablet Commonly known as: Oxy IR/ROXICODONE Take 1 tablet (5 mg total) by mouth every 6 (six) hours as needed for severe pain.   phenazopyridine 200 MG tablet Commonly known as: Pyridium Take 1 tablet (200 mg total) by mouth 3 (three) times daily as needed for pain (urethral spasm).      Follow-up Information    Your prenatal provider in [facility] Follow up.   Why: As scheduled next week. Return to MAU as needed for emergencies.           Sharen Counter Certified Nurse-Midwife 08/04/2019  10:12 PM

## 2019-08-04 NOTE — MAU Note (Signed)
.   Biana Haggar is a 34 y.o. at [redacted]w[redacted]d here in MAU reporting:she was treated for a UTI a little over a week ago. She states that she still has burning with urination.Also states that she noticed her throat was red with white patches  Onset of complaint: a week  Pain score: 0 Vitals:   08/04/19 1714  BP: 130/79  Pulse: 98  Resp: 18  Temp: 98.9 F (37.2 C)     FHT: Lab orders placed from triage: UA

## 2019-08-04 NOTE — MAU Note (Signed)
Throat swab for strep recollected with eswab per micro request. Pt voiced understanding as to need to recollect with eswab.

## 2019-08-06 LAB — GC/CHLAMYDIA PROBE AMP (~~LOC~~) NOT AT ARMC
Chlamydia: NEGATIVE
Comment: NEGATIVE
Comment: NORMAL
Neisseria Gonorrhea: NEGATIVE

## 2019-08-07 LAB — CULTURE, OB URINE: Culture: >=100000 — AB

## 2019-08-08 ENCOUNTER — Telehealth: Payer: Self-pay | Admitting: Advanced Practice Midwife

## 2019-08-08 DIAGNOSIS — O2340 Unspecified infection of urinary tract in pregnancy, unspecified trimester: Secondary | ICD-10-CM

## 2019-08-08 MED ORDER — CEFADROXIL 500 MG PO CAPS: 500.0000 mg | ORAL_CAPSULE | Freq: Two times a day (BID) | ORAL | 0 refills | Status: AC

## 2019-08-08 NOTE — Telephone Encounter (Signed)
Pt has UTI, Duricef Rx 500 mg BID x 7 days sent to pt pharmacy.  Called pt to let her know about urine culture results and need for treatment from 08/05/19 but unable to leave message.  Pt does not have MyChart account.

## 2020-05-24 ENCOUNTER — Encounter (HOSPITAL_COMMUNITY): Payer: Self-pay

## 2020-05-24 ENCOUNTER — Ambulatory Visit (HOSPITAL_COMMUNITY)
Admission: EM | Admit: 2020-05-24 | Discharge: 2020-05-24 | Disposition: A | Discharge status: Home / Self Care | Payer: Medicaid Other | Attending: Family Medicine | Admitting: Family Medicine

## 2020-05-24 DIAGNOSIS — R519 Headache, unspecified: Secondary | ICD-10-CM | POA: Diagnosis present

## 2020-05-24 DIAGNOSIS — R509 Fever, unspecified: Secondary | ICD-10-CM | POA: Diagnosis not present

## 2020-05-24 DIAGNOSIS — R52 Pain, unspecified: Secondary | ICD-10-CM

## 2020-05-24 DIAGNOSIS — U071 COVID-19: Secondary | ICD-10-CM | POA: Insufficient documentation

## 2020-05-24 DIAGNOSIS — J029 Acute pharyngitis, unspecified: Secondary | ICD-10-CM | POA: Diagnosis not present

## 2020-05-24 DIAGNOSIS — Z87891 Personal history of nicotine dependence: Secondary | ICD-10-CM | POA: Insufficient documentation

## 2020-05-24 LAB — POCT URINALYSIS DIPSTICK, ED / UC
Bilirubin Urine: NEGATIVE
Glucose, UA: NEGATIVE mg/dL
Ketones, ur: NEGATIVE mg/dL
Leukocytes,Ua: NEGATIVE
Nitrite: NEGATIVE
Protein, ur: 30 mg/dL — AB
Specific Gravity, Urine: 1.02 (ref 1.005–1.030)
Urobilinogen, UA: 0.2 mg/dL (ref 0.0–1.0)
pH: 6 (ref 5.0–8.0)

## 2020-05-24 LAB — RESP PANEL BY RT-PCR (FLU A&B, COVID) ARPGX2
Influenza A by PCR: NEGATIVE
Influenza B by PCR: NEGATIVE
SARS Coronavirus 2 by RT PCR: POSITIVE — AB

## 2020-05-24 MED ORDER — ACETAMINOPHEN 325 MG PO TABS
650.0000 mg | ORAL_TABLET | Freq: Once | ORAL | Status: AC
  Administered 2020-05-24: 650 mg via ORAL

## 2020-05-24 MED ORDER — ACETAMINOPHEN 325 MG PO TABS
ORAL_TABLET | ORAL | Status: AC
  Filled 2020-05-24: qty 2

## 2020-05-24 NOTE — ED Triage Notes (Signed)
Pt in with c/o headache, chills, and body ache that started 2 days ago  States that she has been having urinary frequency and also had a fever  Pt has had tylenol with no relief

## 2020-05-24 NOTE — ED Provider Notes (Signed)
[place]    CSN: [id number] Arrival date & time: 05/24/20  1518      History   Chief Complaint Chief Complaint  Patient presents with  . Chills  . Headache  . Generalized Body Aches    HPI Heather Lynch is a 35 y.o. female.   Here today with 3 day history of fever, urinary frequency, generalized body aches, sore throat, sinus pain and pressure, headache. Denies CP, SOB, abdominal pain, N/V/D. Son sick with a cold at home and possible COVID exposure last week. No known chronic medical conditions known. Taking tylenol with mild temporary relief.      Past Medical History:  Diagnosis Date  . Anemia   . Depression    refuses to take meds  . Gonorrhea   . Heart murmur   . Pneumonia   . Preterm delivery 10/20/2018  . Spontaneous abortion 10/20/2018  . Urinary tract infection     Patient Active Problem List   Diagnosis Date Noted  . Melena 04/12/2019  . Abdominal pain 04/12/2019  . Vulvovaginal candidiasis 04/12/2019  . Leucocytosis 12/26/2018  . Acute pyelonephritis 12/26/2018  . Sepsis due to gram-negative UTI ([place]) 12/26/2018    Past Surgical History:  Procedure Laterality Date  . CESAREAN SECTION     X 5  . DILATION AND CURETTAGE OF UTERUS N/A 10/20/2018   Procedure: DILATATION AND CURETTAGE;  Surgeon: Woodroe Mode, MD;  Location: [facility];  Service: Gynecology;  Laterality: N/A;    OB History    Gravida  9   Para  8   Term  6   Preterm  1   AB      Living  7     SAB  0   IAB      Ectopic      Multiple  0   Live Births  7            Home Medications    Prior to Admission medications   Medication Sig Start Date End Date Taking? Authorizing Provider  famotidine (PEPCID) 20 MG tablet Take 1 tablet (20 mg total) by mouth 2 (two) times daily. 08/04/19   Leftwich-Kirby, Kathie Dike, CNM  ferrous sulfate 325 (65 FE) MG tablet Take 1 tablet (325 mg total) by mouth daily. 06/12/19 06/11/20  Nugent, Gerrie Nordmann, NP  oxyCODONE (OXY  IR/ROXICODONE) 5 MG immediate release tablet Take 1 tablet (5 mg total) by mouth every 6 (six) hours as needed for severe pain. 07/01/19   Fair, Marin Shutter, MD  phenazopyridine (PYRIDIUM) 200 MG tablet Take 1 tablet (200 mg total) by mouth 3 (three) times daily as needed for pain (urethral spasm). 08/04/19   Leftwich-Kirby, Kathie Dike, CNM    Family History Family History  Problem Relation Age of Onset  . Hypertension Other   . Other Neg Hx     Social History Social History   Tobacco Use  . Smoking status: Former Smoker    Quit date: 12/30/2011    Years since quitting: 8.4  . Smokeless tobacco: Never Used  Vaping Use  . Vaping Use: Never used  Substance Use Topics  . Alcohol use: Yes    Comment: occasional  . Drug use: Yes    Types: Marijuana    Comment: LAST SMOKED - NOV     Allergies   Patient has no known allergies.   Review of Systems Review of Systems PER HPI    Physical Exam Triage Vital Signs ED  Triage Vitals  Enc Vitals Group     BP 05/24/20 1743 (!) 125/94     Pulse Rate 05/24/20 1743 77     Resp 05/24/20 1743 19     Temp 05/24/20 1743 (!) 100.5 F (38.1 C)     Temp src --      SpO2 05/24/20 1743 99 %     Weight --      Height --      Head Circumference --      Peak Flow --      Pain Score 05/24/20 1738 7     Pain Loc --      Pain Edu? --      Excl. in GC? --    No data found.  Updated Vital Signs BP (!) 125/94   Pulse 77   Temp (!) 100.5 F (38.1 C)   Resp 19   SpO2 99%   Breastfeeding Yes   Visual Acuity Right Eye Distance:   Left Eye Distance:   Bilateral Distance:    Right Eye Near:   Left Eye Near:    Bilateral Near:     Physical Exam Vitals and nursing note reviewed.  Constitutional:      Appearance: Normal appearance. She is not ill-appearing.  HENT:     Head: Atraumatic.     Right Ear: Tympanic membrane normal.     Left Ear: Tympanic membrane normal.     Nose: Rhinorrhea present.     Mouth/Throat:     Mouth: Mucous  membranes are moist.     Pharynx: Posterior oropharyngeal erythema present.  Eyes:     Extraocular Movements: Extraocular movements intact.     Conjunctiva/sclera: Conjunctivae normal.  Cardiovascular:     Rate and Rhythm: Normal rate and regular rhythm.     Heart sounds: Normal heart sounds.  Pulmonary:     Effort: Pulmonary effort is normal. No respiratory distress.     Breath sounds: Normal breath sounds. No wheezing or rales.  Abdominal:     General: Bowel sounds are normal. There is no distension.     Palpations: Abdomen is soft.     Tenderness: There is no abdominal tenderness. There is no right CVA tenderness, left CVA tenderness or guarding.  Musculoskeletal:        General: Normal range of motion.     Cervical back: Normal range of motion and neck supple.  Skin:    General: Skin is warm and dry.  Neurological:     Mental Status: She is alert and oriented to person, place, and time.  Psychiatric:        Mood and Affect: Mood normal.        Thought Content: Thought content normal.        Judgment: Judgment normal.      UC Treatments / Results  Labs (all labs ordered are listed, but only abnormal results are displayed) Labs Reviewed  POCT URINALYSIS DIPSTICK, ED / UC - Abnormal; Notable for the following components:      Result Value   Hgb urine dipstick SMALL (*)    Protein, ur 30 (*)    All other components within normal limits  RESP PANEL BY RT-PCR (FLU A&B, COVID) [id number]  URINE CULTURE    EKG   Radiology No results found.  Procedures Procedures (including critical care time)  Medications Ordered in UC Medications  acetaminophen (TYLENOL) tablet 650 mg (650 mg Oral Given 05/24/20 1754)    Initial Impression /  Assessment and Plan / UC Course  I have reviewed the triage vital signs and the nursing notes.  Pertinent labs & imaging results that were available during my care of the patient were reviewed by me and considered in my medical decision making  (see chart for details).     Febrile in triage, tylenol given. Otherwise vitals and exam very reassuring today. Resp swab pending, urine culture sent given benign U/A and hx of pyelonephritis. Discussed OTC fever reducers, rest, mucinex, return precautions.    Final Clinical Impressions(s) / UC Diagnoses   Final diagnoses:  Fever, unspecified  Body aches  Sore throat   Discharge Instructions   None    ED Prescriptions    None     PDMP not reviewed this encounter.   Particia Nearing, [place] 05/24/20 1831

## 2020-05-24 NOTE — ED Notes (Signed)
Pt called in lobby and on phone number provided, no response

## 2020-05-26 LAB — URINE CULTURE: Culture: <10000 — AB

## 2020-11-07 ENCOUNTER — Other Ambulatory Visit: Payer: Self-pay

## 2020-11-07 ENCOUNTER — Emergency Department (HOSPITAL_BASED_OUTPATIENT_CLINIC_OR_DEPARTMENT_OTHER)
Admission: EM | Admit: 2020-11-07 | Discharge: 2020-11-08 | Disposition: A | Discharge status: Home / Self Care | Payer: Medicaid Other | Attending: Emergency Medicine | Admitting: Emergency Medicine

## 2020-11-07 ENCOUNTER — Emergency Department (HOSPITAL_BASED_OUTPATIENT_CLINIC_OR_DEPARTMENT_OTHER): Payer: Medicaid Other

## 2020-11-07 ENCOUNTER — Encounter (HOSPITAL_BASED_OUTPATIENT_CLINIC_OR_DEPARTMENT_OTHER): Payer: Self-pay | Admitting: Obstetrics and Gynecology

## 2020-11-07 DIAGNOSIS — Z87891 Personal history of nicotine dependence: Secondary | ICD-10-CM | POA: Insufficient documentation

## 2020-11-07 DIAGNOSIS — R202 Paresthesia of skin: Secondary | ICD-10-CM | POA: Diagnosis not present

## 2020-11-07 DIAGNOSIS — R2242 Localized swelling, mass and lump, left lower limb: Secondary | ICD-10-CM | POA: Insufficient documentation

## 2020-11-07 DIAGNOSIS — M79605 Pain in left leg: Secondary | ICD-10-CM | POA: Diagnosis present

## 2020-11-07 NOTE — ED Triage Notes (Signed)
Patient reports tingling in her left leg that comes and goes and reports she has noticed a knot come up on the back of her leg that hurts. Patient reports she was sent to rule out a blood clot.

## 2020-11-08 LAB — URINALYSIS, ROUTINE W REFLEX MICROSCOPIC
Bilirubin Urine: NEGATIVE
Glucose, UA: NEGATIVE mg/dL
Hgb urine dipstick: NEGATIVE
Ketones, ur: NEGATIVE mg/dL
Leukocytes,Ua: NEGATIVE
Nitrite: NEGATIVE
Protein, ur: NEGATIVE mg/dL
Specific Gravity, Urine: 1.024 (ref 1.005–1.030)
pH: 6.5 (ref 5.0–8.0)

## 2020-11-08 LAB — PREGNANCY, URINE: Preg Test, Ur: NEGATIVE

## 2020-11-08 NOTE — ED Provider Notes (Signed)
[facility] EMERGENCY DEPT Provider Note   CSN: [id number] Arrival date & time: 11/07/20  2048     History Chief Complaint  Patient presents with   Tingling    Heather Lynch is a 35 y.o. female.  The history is provided by the patient.  Leg Pain Location:  Leg Pain details:    Quality:  Aching and dull   Radiates to:  Does not radiate   Severity:  Moderate   Onset quality:  Gradual   Timing:  Constant   Progression:  Unchanged Chronicity:  New Prior injury to area:  No Relieved by:  None tried Worsened by:  Nothing Associated symptoms: no back pain, no fever and no neck pain   Patient presents for multiple complaints.  She reports over the last days she has noted pain and a "knot "to her left calf.  No trauma.  It is mildly tender to palpation.  No previous history of VTE.  No chest pain or shortness of breath.  She does not take oral contraceptives.  She is a non-smoker.  Patient also reports symptoms of a UTI would like to have her urinalysis test  Patient also reports intermittent numbness and tingling in her right hand and forearm.  This has been intermittent recently.  It seems to worsen when she picks up the phone and talks.  No significant weakness has been noted No other focal weakness or numbness has been reported    Past Medical History:  Diagnosis Date   Anemia    Depression    refuses to take meds   Gonorrhea    Heart murmur    Pneumonia    Preterm delivery 10/20/2018   Spontaneous abortion 10/20/2018   Urinary tract infection     Patient Active Problem List   Diagnosis Date Noted   Melena 04/12/2019   Abdominal pain 04/12/2019   Vulvovaginal candidiasis 04/12/2019   Leucocytosis 12/26/2018   Acute pyelonephritis 12/26/2018   Sepsis due to gram-negative UTI ([facility]) 12/26/2018    Past Surgical History:  Procedure Laterality Date   CESAREAN SECTION     X 5   DILATION AND CURETTAGE OF UTERUS N/A 10/20/2018   Procedure:  DILATATION AND CURETTAGE;  Surgeon: Adam Phenix, MD;  Location: [facility] LD [facility]S;  Service: Gynecology;  Laterality: N/A;     OB History     Gravida  9   Para  8   Term  6   Preterm  1   AB      Living  7      SAB  0   IAB      Ectopic      Multiple  0   Live Births  7           Family History  Problem Relation Age of Onset   Hypertension Other    Other Neg Hx     Social History   Tobacco Use   Smoking status: Former    Pack years: 0.00    Types: Cigarettes    Quit date: 12/30/2011    Years since quitting: 8.8   Smokeless tobacco: Never  Vaping Use   Vaping Use: Never used  Substance Use Topics   Alcohol use: Yes    Comment: occasional   Drug use: Yes    Types: Marijuana    Comment: LAST SMOKED - NOV    Home Medications Prior to Admission medications   Medication Sig Start Date End Date Taking? Authorizing Provider  famotidine (PEPCID) 20 MG tablet Take 1 tablet (20 mg total) by mouth 2 (two) times daily. 08/04/19   Leftwich-Kirby, Wilmer Floor, CNM  ferrous sulfate 325 (65 FE) MG tablet Take 1 tablet (325 mg total) by mouth daily. 06/12/19 06/11/20  Nugent, Odie Sera, NP    Allergies    Patient has no known allergies.  Review of Systems   Review of Systems  Constitutional:  Negative for fever.  Respiratory:  Negative for shortness of breath.   Cardiovascular:  Negative for chest pain.  Genitourinary:  Positive for dysuria.  Musculoskeletal:  Negative for back pain and neck pain.  Skin:  Positive for color change.  Neurological:  Positive for numbness. Negative for weakness.  All other systems reviewed and are negative.  Physical Exam Updated Vital Signs BP (!) 145/95 (BP Location: Right Arm)   Pulse 79   Temp 98.7 F (37.1 C)   Resp 18   Ht 1.727 m (5\' 8" )   Wt (!) 137 kg   SpO2 100%   Breastfeeding Yes   BMI 45.92 kg/m   Physical Exam CONSTITUTIONAL: Well developed/well nourished HEAD: Normocephalic/atraumatic EYES: EOMI/PERRL, no  nystagmus, no ptosis ENMT: Mucous membranes moist NECK: supple no meningeal signs CV: S1/S2 noted, no murmurs/rubs/gallops noted LUNGS: Lungs are clear to auscultation bilaterally, no apparent distress ABDOMEN: soft, nontender, no rebound or guarding GU:no cva tenderness NEURO:Awake/alert, face symmetric, no arm or leg drift is noted Equal 5/5 strength with shoulder abduction, elbow flex/extension, wrist flex/extension in upper extremities and equal hand grips bilaterally Equal 5/5 strength with hip flexion,knee flex/extension, foot dorsi/plantar flexion Cranial nerves 3/4/5/6/11/25/08/11/12 tested and intact Sensation to light touch intact in all extremities EXTREMITIES: pulses normal, full ROM, no calf tenderness is noted.  Distal pulses equal intact.  No erythema, no crepitus Small nodule that is tender to palpation on the superior aspect of the left calf SKIN: warm, color normal PSYCH: no abnormalities of mood noted   ED Results / Procedures / Treatments   Labs (all labs ordered are listed, but only abnormal results are displayed) Labs Reviewed  URINE CULTURE  URINALYSIS, ROUTINE W REFLEX MICROSCOPIC  PREGNANCY, URINE    EKG None  Radiology 01/26/09 Venous Img Lower Unilateral Left  Result Date: 11/07/2020 CLINICAL DATA:  Leg pain and swelling EXAM: Left LOWER EXTREMITY VENOUS DOPPLER ULTRASOUND TECHNIQUE: Gray-scale sonography with compression, as well as color and duplex ultrasound, were performed to evaluate the deep venous system(s) from the level of the common femoral vein through the popliteal and proximal calf veins. COMPARISON:  None. FINDINGS: VENOUS Normal compressibility of the common femoral, superficial femoral, and popliteal veins, as well as the visualized calf veins. Visualized portions of profunda femoral vein and great saphenous vein unremarkable. No filling defects to suggest DVT on grayscale or color Doppler imaging. Doppler waveforms show normal direction of venous  flow, normal respiratory plasticity and response to augmentation. Limited views of the contralateral common femoral vein are unremarkable. OTHER Possible thrombosis of superficial venous varicosity in the upper calf Limitations: none IMPRESSION: 1. Negative for acute DVT. 2. Possible focal thrombosis of superficial venous varicosity at the upper calf. Electronically Signed   By: 11/09/2020 M.D.   On: 11/07/2020 22:53    Procedures Procedures   Medications Ordered in ED Medications - No data to display  ED Course  I have reviewed the triage vital signs and the nursing notes.  Pertinent labs & imaging results that were available during my care of the patient  were reviewed by me and considered in my medical decision making (see chart for details).    MDM Rules/Calculators/A&P                          Patient presented for multiple complaints.  No signs of UTI.  She reported numbness in her right hand, no signs of acute stroke. She denied any radiculopathy symptoms from her neck or back  She also reported a knot in her left calf.  DVT study is negative.  She may have a superficial varicosity in that region.  No indication for antibiotics or anticoagulation at this time.  At her request, will refer to neurology for the numbness in her left hand Final Clinical Impression(s) / ED Diagnoses Final diagnoses:  Left leg pain  Paresthesia    Rx / DC Orders ED Discharge Orders     None        Zadie Rhine, MD 11/08/20 0120

## 2020-11-08 NOTE — Discharge Instructions (Signed)
Be sure to keep your left leg elevated.  You can apply warm compresses to your left leg If you any worsening pain, swelling or redness to the left leg in the next week please return to the ER

## 2020-11-09 LAB — URINE CULTURE: Culture: <10000 — AB

## 2020-11-26 ENCOUNTER — Emergency Department (HOSPITAL_COMMUNITY)
Admission: EM | Admit: 2020-11-26 | Discharge: 2020-11-26 | Disposition: A | Discharge status: Home / Self Care | Payer: Medicaid Other | Attending: Emergency Medicine | Admitting: Emergency Medicine

## 2020-11-26 ENCOUNTER — Other Ambulatory Visit: Payer: Self-pay

## 2020-11-26 DIAGNOSIS — Z87891 Personal history of nicotine dependence: Secondary | ICD-10-CM | POA: Diagnosis not present

## 2020-11-26 DIAGNOSIS — N390 Urinary tract infection, site not specified: Secondary | ICD-10-CM | POA: Diagnosis not present

## 2020-11-26 DIAGNOSIS — R109 Unspecified abdominal pain: Secondary | ICD-10-CM | POA: Diagnosis present

## 2020-11-26 LAB — URINALYSIS, ROUTINE W REFLEX MICROSCOPIC
Bilirubin Urine: NEGATIVE
Glucose, UA: NEGATIVE mg/dL
Ketones, ur: NEGATIVE mg/dL
Nitrite: NEGATIVE
Protein, ur: 30 mg/dL — AB
Specific Gravity, Urine: 1.016 (ref 1.005–1.030)
WBC, UA: >50 WBC/hpf — ABNORMAL HIGH (ref 0–5)
pH: 7 (ref 5.0–8.0)

## 2020-11-26 MED ORDER — LIDOCAINE HCL (PF) 1 % IJ SOLN
INTRAMUSCULAR | Status: AC
  Administered 2020-11-26: 5 mL
  Filled 2020-11-26: qty 5

## 2020-11-26 MED ORDER — ACETAMINOPHEN 325 MG PO TABS
650.0000 mg | ORAL_TABLET | Freq: Once | ORAL | Status: AC
  Administered 2020-11-26: 650 mg via ORAL
  Filled 2020-11-26: qty 2

## 2020-11-26 MED ORDER — CEFTRIAXONE SODIUM 1 G IJ SOLR
1.0000 g | Freq: Once | INTRAMUSCULAR | Status: AC
  Administered 2020-11-26: 1 g via INTRAMUSCULAR
  Filled 2020-11-26: qty 10

## 2020-11-26 MED ORDER — CEPHALEXIN 500 MG PO CAPS: 500.0000 mg | ORAL_CAPSULE | Freq: Three times a day (TID) | ORAL | 0 refills | Status: AC

## 2020-11-26 NOTE — ED Triage Notes (Signed)
Pt c/o abdominal/ lower back pain, dysuria x3 days.

## 2020-11-26 NOTE — Discharge Instructions (Addendum)
Call your primary care doctor or specialist as discussed in the next 2-3 days.   Return immediately back to the ER if:  Your symptoms worsen within the next 12-24 hours. You develop new symptoms such as new fevers, persistent vomiting, new pain, shortness of breath, or new weakness or numbness, or if you have any other concerns.  

## 2020-11-26 NOTE — ED Provider Notes (Signed)
[facility] EMERGENCY DEPARTMENT Provider Note   CSN: [id number] Arrival date & time: 11/26/20  0037     History Chief Complaint  Patient presents with   Abdominal Pain   Back Pain    Heather Lynch is a 35 y.o. female.  Patient presents with persistent aching bilateral lower back pain and burning urination ongoing for 3 days.  Denies fevers at home.  Denies vomiting or diarrhea.      Past Medical History:  Diagnosis Date   Anemia    Depression    refuses to take meds   Gonorrhea    Heart murmur    Pneumonia    Preterm delivery 10/20/2018   Spontaneous abortion 10/20/2018   Urinary tract infection     Patient Active Problem List   Diagnosis Date Noted   Melena 04/12/2019   Abdominal pain 04/12/2019   Vulvovaginal candidiasis 04/12/2019   Leucocytosis 12/26/2018   Acute pyelonephritis 12/26/2018   Sepsis due to gram-negative UTI ([facility]) 12/26/2018    Past Surgical History:  Procedure Laterality Date   CESAREAN SECTION     X 5   DILATION AND CURETTAGE OF UTERUS N/A 10/20/2018   Procedure: DILATATION AND CURETTAGE;  Surgeon: Adam Phenix, MD;  Location: [facility] LD [facility];  Service: Gynecology;  Laterality: N/A;     OB History     Gravida  9   Para  8   Term  6   Preterm  1   AB      Living  7      SAB  0   IAB      Ectopic      Multiple  0   Live Births  7           Family History  Problem Relation Age of Onset   Hypertension Other    Other Neg Hx     Social History   Tobacco Use   Smoking status: Former    Pack years: 0.00    Types: Cigarettes    Quit date: 12/30/2011    Years since quitting: 8.9   Smokeless tobacco: Never  Vaping Use   Vaping Use: Never used  Substance Use Topics   Alcohol use: Yes    Comment: occasional   Drug use: Yes    Types: Marijuana    Comment: LAST SMOKED - NOV    Home Medications Prior to Admission medications   Medication Sig Start Date End Date Taking? Authorizing Provider   ELDERBERRY PO Take 3 capsules by mouth daily.   Yes [provider]  famotidine (PEPCID) 20 MG tablet Take 1 tablet (20 mg total) by mouth 2 (two) times daily. Patient not taking: Reported on 11/26/2020 08/04/19   Sharen Counter A, CNM  ferrous sulfate 325 (65 FE) MG tablet Take 1 tablet (325 mg total) by mouth daily. Patient not taking: Reported on 11/26/2020 06/12/19 06/11/20  Nugent, Odie Sera, NP    Allergies    Patient has no known allergies.  Review of Systems   Review of Systems  Constitutional:  Negative for fever.  HENT:  Negative for ear pain.   Eyes:  Negative for pain.  Respiratory:  Negative for cough.   Cardiovascular:  Negative for chest pain.  Gastrointestinal:  Negative for abdominal pain.  Genitourinary:  Negative for flank pain.  Musculoskeletal:  Positive for back pain.  Skin:  Negative for rash.  Neurological:  Negative for headaches.   Physical Exam Updated Vital Signs  BP 137/88 (BP Location: Right Arm)   Pulse 83   Temp 98.8 F (37.1 C) (Oral)   Resp 20   SpO2 99%   Physical Exam Constitutional:      General: She is not in acute distress.    Appearance: Normal appearance.  HENT:     Head: Normocephalic.     Nose: Nose normal.  Eyes:     Extraocular Movements: Extraocular movements intact.  Cardiovascular:     Rate and Rhythm: Normal rate.  Pulmonary:     Effort: Pulmonary effort is normal.  Abdominal:     Tenderness: There is no right CVA tenderness or left CVA tenderness.  Musculoskeletal:        General: Normal range of motion.     Cervical back: Normal range of motion.  Neurological:     General: No focal deficit present.     Mental Status: She is alert. Mental status is at baseline.    ED Results / Procedures / Treatments   Labs (all labs ordered are listed, but only abnormal results are displayed) Labs Reviewed  URINALYSIS, ROUTINE W REFLEX MICROSCOPIC - Abnormal; Notable for the following components:      Result Value    APPearance CLOUDY (*)    Hgb urine dipstick SMALL (*)    Protein, ur 30 (*)    Leukocytes,Ua LARGE (*)    WBC, UA >50 (*)    Bacteria, UA FEW (*)    All other components within normal limits  URINE CULTURE    EKG None  Radiology No results found.  Procedures Procedures   Medications Ordered in ED Medications  cefTRIAXone (ROCEPHIN) injection 1 g (has no administration in time range)  acetaminophen (TYLENOL) tablet 650 mg (650 mg Oral Given 11/26/20 7408)    ED Course  I have reviewed the triage vital signs and the nursing notes.  Pertinent labs & imaging results that were available during my care of the patient were reviewed by me and considered in my medical decision making (see chart for details).    MDM Rules/Calculators/A&P                          Vital signs within normal limits on arrival.  Given Tylenol for symptom management.  Urinalysis positive for UTI.  Given Rocephin here in the ER, prescription of Keflex to go home with.  Advise follow-up with primary care doctor within the week.  Advising immediate return if she has fevers worsening symptoms or any additional concerns.  Final Clinical Impression(s) / ED Diagnoses Final diagnoses:  Urinary tract infection without hematuria, site unspecified    Rx / DC Orders ED Discharge Orders     None        Cheryll Cockayne, MD 11/26/20 [phone]

## 2020-11-28 LAB — URINE CULTURE: Culture: >=100000 — AB

## 2020-11-29 ENCOUNTER — Telehealth: Payer: Self-pay | Admitting: *Deleted

## 2020-11-29 NOTE — Telephone Encounter (Signed)
Post ED Visit - Positive Culture Follow-up: Unsuccessful Patient Follow-up  Culture assessed and recommendations reviewed by:  []  , Pharm.D. []  Enzo Bi, .D., BCPS AQ-ID []  Celedonio Miyamoto, Pharm.D., BCPS []  [address], .D., BCPS []  [place], .D., BCPS, AAHIVP []  Georgina Pillion, Pharm.D., BCPS, AAHIVP []  [address], PharmD []  , PharmD, BCPS  Positive urine culture  []  Patient discharged without antimicrobial prescription and treatment is now indicated [x]  Organism is resistant to prescribed ED discharge antimicrobial []  Patient with positive blood cultures  Plan:  Stop Cephalexin.  Take Fosfomycin x 2 doses.  Dose 1 0n day 1 and Dose 2 on day 4.  Britni Henderly, PA-C  Unable to contact patient after 3 attempts, letter will be sent to address on file  [place] 11/29/2020, 8:48 AM

## 2020-11-29 NOTE — Progress Notes (Signed)
ED Antimicrobial Stewardship Positive Culture Follow Up   Hermenia Fritcher is an 35 y.o. female who presented to [facility] on 11/26/2020 with a chief complaint of  Chief Complaint  Patient presents with   Abdominal Pain   Back Pain    Recent Results (from the past 720 hour(s))  Urine culture     Status: Abnormal   Collection Time: 11/07/20 11:55 PM   Specimen: Urine, Random  Result Value Ref Range Status   Specimen Description   Final    URINE, RANDOM Performed at [facility], [address] [place], [place] [address]    Special Requests   Final    NONE Performed at [facility], [address], [place], [place] [address]    Culture (A)  Final    <10,000 COLONIES/mL INSIGNIFICANT GROWTH Performed at [facility], [address], [place], [place] [address]    Report Status 11/09/2020 FINAL  Final  Urine C&S     Status: Abnormal   Collection Time: 11/26/20 12:58 AM   Specimen: Urine, Random  Result Value Ref Range Status   Specimen Description URINE, RANDOM  Final   Special Requests   Final    NONE Performed at [facility], [address] [place], [place] [address]    Culture (A)  Final    >=100,000 COLONIES/mL ESCHERICHIA COLI Confirmed Extended Spectrum Beta-Lactamase Producer (ESBL).  In bloodstream infections from ESBL organisms, carbapenems are preferred over piperacillin/tazobactam. They are shown to have a lower risk of mortality.    Report Status 11/28/2020 FINAL  Final   Organism ID, Bacteria ESCHERICHIA COLI (A)  Final      Susceptibility   Escherichia coli - MIC*    AMPICILLIN >=32 RESISTANT Resistant     CEFAZOLIN >=64 RESISTANT Resistant     CEFEPIME 4 INTERMEDIATE Intermediate     CEFTRIAXONE >=64 RESISTANT Resistant     CIPROFLOXACIN <=0.25 SENSITIVE Sensitive     GENTAMICIN <=1 SENSITIVE Sensitive     IMIPENEM <=0.25 SENSITIVE Sensitive     NITROFURANTOIN <=16 SENSITIVE Sensitive      TRIMETH/SULFA >=320 RESISTANT Resistant     AMPICILLIN/SULBACTAM 8 SENSITIVE Sensitive     PIP/TAZO <=4 SENSITIVE Sensitive     * >=100,000 COLONIES/mL ESCHERICHIA COLI    [x]  Treated with cephalexin, organism resistant to prescribed antimicrobial []  Patient discharged originally without antimicrobial agent and treatment is now indicated  New antibiotic prescription: Discontinue cephalexin and prescribe fosfomycin 3 g x 2 doses. Take 1st dose on day 1, and 2nd dose on day 4.   ED Provider: , PA-C  , PharmD, [facility] Emergency Medicine Clinical Pharmacist ED RPh Phone: [phone] Main RX: [phone]

## 2020-12-06 ENCOUNTER — Ambulatory Visit (HOSPITAL_COMMUNITY)
Admission: EM | Admit: 2020-12-06 | Discharge: 2020-12-06 | Disposition: A | Discharge status: Home / Self Care | Payer: Medicaid Other

## 2020-12-06 ENCOUNTER — Encounter (HOSPITAL_COMMUNITY): Payer: Self-pay | Admitting: Emergency Medicine

## 2020-12-06 ENCOUNTER — Other Ambulatory Visit: Payer: Self-pay

## 2020-12-06 DIAGNOSIS — T162XXA Foreign body in left ear, initial encounter: Secondary | ICD-10-CM

## 2020-12-06 DIAGNOSIS — H9202 Otalgia, left ear: Secondary | ICD-10-CM | POA: Diagnosis not present

## 2020-12-06 NOTE — Discharge Instructions (Addendum)
We successfully removed a small tick-like insect from your left ear canal. Your outer ear canal is still irritated and may occasionally bleed. Do not put anything else in your ear canal. Should heal on own. If any continued ear pain occurs or any discharge, return for recheck. Otherwise, follow-up as needed.

## 2020-12-06 NOTE — ED Provider Notes (Signed)
[facility]    CSN: [id number] Arrival date & time: 12/06/20  1420      History   Chief Complaint Chief Complaint  Patient presents with   Otalgia    HPI Heather Lynch is a 35 y.o. female.   35 year old female accompanied by 3 sons presents with left ear pain/irritation that started this morning. She was lying down and heard something odd in her left ear. She sat up and tried to swipe any ear wax from her ear canal and noticed some blood on her nail. She laid back down and still heard the strange noise. She continues to have occasional pain and irritation today. She denies any fever, nasal congestion or cough. No previous history of ear problems. Concerned over "something" crawling in her ear but does not want to know what it may be until it is gone. No recent swimming. No history of impacted cerumen. Recent UTI and on last day of Keflex. Other chronic health issues include anemia and depression. No other current medications.   The history is provided by the patient.   Past Medical History:  Diagnosis Date   Anemia    Depression    refuses to take meds   Gonorrhea    Heart murmur    Pneumonia    Preterm delivery 10/20/2018   Spontaneous abortion 10/20/2018   Urinary tract infection     Patient Active Problem List   Diagnosis Date Noted   Melena 04/12/2019   Abdominal pain 04/12/2019   Vulvovaginal candidiasis 04/12/2019   Leucocytosis 12/26/2018   Acute pyelonephritis 12/26/2018   Sepsis due to gram-negative UTI (HCC) 12/26/2018    Past Surgical History:  Procedure Laterality Date   CESAREAN SECTION     X 5   DILATION AND CURETTAGE OF UTERUS N/A 10/20/2018   Procedure: DILATATION AND CURETTAGE;  Surgeon: Adam Phenix, MD;  Location: [facility];  Service: Gynecology;  Laterality: N/A;    OB History     Gravida  9   Para  8   Term  6   Preterm  1   AB      Living  7      SAB  0   IAB      Ectopic      Multiple  0   Live Births   7            Home Medications    Prior to Admission medications   Medication Sig Start Date End Date Taking? Authorizing Provider  cephALEXin (KEFLEX) 500 MG capsule Take 500 mg by mouth 3 (three) times daily.   Yes [provider]  ELDERBERRY PO Take 3 capsules by mouth daily.    [provider]    Family History Family History  Problem Relation Age of Onset   Hypertension Other    Other Neg Hx     Social History Social History   Tobacco Use   Smoking status: Former    Types: Cigarettes    Quit date: 12/30/2011    Years since quitting: 8.9   Smokeless tobacco: Never  Vaping Use   Vaping Use: Never used  Substance Use Topics   Alcohol use: Yes    Comment: occasional   Drug use: Yes    Types: Marijuana    Comment: LAST SMOKED - NOV     Allergies   Patient has no known allergies.   Review of Systems Review of Systems  Constitutional:  Negative  for activity change, appetite change, chills, fatigue and fever.  HENT:  Positive for ear discharge (slight blood seen when wiping ear canal) and ear pain. Negative for congestion, facial swelling, hearing loss (but hears unusual noise in left ear), mouth sores, postnasal drip, rhinorrhea, sinus pressure, sinus pain, sore throat, tinnitus and trouble swallowing.   Eyes:  Negative for pain, discharge, redness and itching.  Respiratory:  Negative for cough.   Gastrointestinal:  Negative for nausea and vomiting.  Musculoskeletal:  Negative for neck pain and neck stiffness.  Skin:  Negative for color change and rash.  Allergic/Immunologic: Negative for environmental allergies, food allergies and immunocompromised state.  Neurological:  Negative for dizziness, tremors, seizures, syncope, facial asymmetry, speech difficulty, weakness, light-headedness, numbness and headaches.  Hematological:  Negative for adenopathy. Does not bruise/bleed easily.    Physical Exam Triage Vital Signs ED Triage Vitals  Enc  Vitals Group     BP 12/06/20 1528 (!) 137/91     Pulse Rate 12/06/20 1528 73     Resp 12/06/20 1528 16     Temp 12/06/20 1528 98.9 F (37.2 C)     Temp Source 12/06/20 1528 Oral     SpO2 12/06/20 1528 100 %     Weight --      Height --      Head Circumference --      Peak Flow --      Pain Score 12/06/20 1525 2     Pain Loc --      Pain Edu? --      Excl. in GC? --    No data found.  Updated Vital Signs BP (!) 137/91 (BP Location: Left Arm)   Pulse 73   Temp 98.9 F (37.2 C) (Oral)   Resp 16   LMP  (LMP Unknown)   SpO2 100%   Breastfeeding Yes   Visual Acuity Right Eye Distance:   Left Eye Distance:   Bilateral Distance:    Right Eye Near:   Left Eye Near:    Bilateral Near:     Physical Exam Vitals and nursing note reviewed.  Constitutional:      General: She is awake. She is not in acute distress.    Appearance: She is well-developed and well-groomed.     Comments: She is sitting on the exam table in no acute distress with her 67 month old boy sleeping on her lap.   HENT:     Head: Normocephalic and atraumatic.     Right Ear: Hearing, tympanic membrane, ear canal and external ear normal. No tenderness. No foreign body. Tympanic membrane is not injected, erythematous or bulging.     Left Ear: Hearing, tympanic membrane and external ear normal. Tenderness present. There is no impacted cerumen. A foreign body is present. Tympanic membrane is not injected, erythematous or bulging.     Ears:      Comments: Left ear- Small insect- possible tick- present on base of lower ear canal midway to TM. Minimal cerumen present.  Some irritation and slight bleeding present at 3 0'clock of left ear canal at entrance.     Nose: Nose normal.     Mouth/Throat:     Lips: Pink.     Mouth: Mucous membranes are moist.     Pharynx: Oropharynx is clear. Uvula midline.  Eyes:     Extraocular Movements: Extraocular movements intact.     Conjunctiva/sclera: Conjunctivae normal.   Cardiovascular:     Rate and Rhythm: Normal rate.  Pulmonary:     Effort: Pulmonary effort is normal.  Musculoskeletal:     Cervical back: Normal range of motion and neck supple.  Skin:    General: Skin is warm and dry.  Neurological:     General: No focal deficit present.     Mental Status: She is alert and oriented to person, place, and time.  Psychiatric:        Mood and Affect: Mood normal.        Behavior: Behavior normal. Behavior is cooperative.        Thought Content: Thought content normal.        Judgment: Judgment normal.     UC Treatments / Results  Labs (all labs ordered are listed, but only abnormal results are displayed) Labs Reviewed - No data to display  EKG   Radiology No results found.  Procedures Procedures (including critical care time)  Medications Ordered in UC Medications - No data to display  Initial Impression / Assessment and Plan / UC Course  I have reviewed the triage vital signs and the nursing notes.  Pertinent labs & imaging results that were available during my care of the patient were reviewed by me and considered in my medical decision making (see chart for details).     Nurse performed left ear irrigation to remove foreign body/insect. Used water irrigation and small curette with success. Rechecked ear canal and no foreign bodies or parts of insect seen. Still some redness and irritation near outer ear canal. Discussed that she does not need to put any ear drops in her ear canal and should not put any items including Q-tips or fingernails in and along ear canal. Area should heal on own. If any continued ear pain occurs, any ear discharge or unusual noises present heard in her ear, she should return for recheck and may need ENT consult. Otherwise, follow-up as needed.  Final Clinical Impressions(s) / UC Diagnoses   Final diagnoses:  Acute foreign body of ear canal, left, initial encounter  Acute ear pain, left     Discharge  Instructions      We successfully removed a small tick-like insect from your left ear canal. Your outer ear canal is still irritated and may occasionally bleed. Do not put anything else in your ear canal. Should heal on own. If any continued ear pain occurs or any discharge, return for recheck. Otherwise, follow-up as needed.      ED Prescriptions   None    PDMP not reviewed this encounter.   Sudie Grumbling, NP 12/07/20 [phone]

## 2020-12-06 NOTE — ED Triage Notes (Signed)
Patient c/o LFT ear pain that started this morning.   Patient denies fever or any other cold symptoms.   Patient endorses " my ear was bleeding this morning, I scratched it and I had blood under my nail".   Patient denies abnormal hearing.   Patient hasn't used anything for symptoms.

## 2020-12-28 ENCOUNTER — Other Ambulatory Visit: Payer: Self-pay

## 2020-12-28 ENCOUNTER — Encounter (HOSPITAL_BASED_OUTPATIENT_CLINIC_OR_DEPARTMENT_OTHER): Payer: Self-pay

## 2020-12-28 ENCOUNTER — Emergency Department (HOSPITAL_BASED_OUTPATIENT_CLINIC_OR_DEPARTMENT_OTHER): Payer: Medicaid Other

## 2020-12-28 ENCOUNTER — Emergency Department (HOSPITAL_BASED_OUTPATIENT_CLINIC_OR_DEPARTMENT_OTHER)
Admission: EM | Admit: 2020-12-28 | Discharge: 2020-12-28 | Disposition: A | Discharge status: Home / Self Care | Payer: Medicaid Other | Attending: Emergency Medicine | Admitting: Emergency Medicine

## 2020-12-28 DIAGNOSIS — Z87891 Personal history of nicotine dependence: Secondary | ICD-10-CM | POA: Diagnosis not present

## 2020-12-28 DIAGNOSIS — Z20822 Contact with and (suspected) exposure to covid-19: Secondary | ICD-10-CM | POA: Insufficient documentation

## 2020-12-28 DIAGNOSIS — R5383 Other fatigue: Secondary | ICD-10-CM | POA: Insufficient documentation

## 2020-12-28 DIAGNOSIS — R110 Nausea: Secondary | ICD-10-CM | POA: Insufficient documentation

## 2020-12-28 DIAGNOSIS — R109 Unspecified abdominal pain: Secondary | ICD-10-CM | POA: Diagnosis not present

## 2020-12-28 LAB — RESP PANEL BY RT-PCR (FLU A&B, COVID) ARPGX2
Influenza A by PCR: NEGATIVE
Influenza B by PCR: NEGATIVE
SARS Coronavirus 2 by RT PCR: NEGATIVE

## 2020-12-28 LAB — COMPREHENSIVE METABOLIC PANEL WITH GFR
ALT: 10 U/L (ref 0–44)
AST: 17 U/L (ref 15–41)
Albumin: 4.1 g/dL (ref 3.5–5.0)
Alkaline Phosphatase: 85 U/L (ref 38–126)
Anion gap: 7 (ref 5–15)
BUN: 10 mg/dL (ref 6–20)
CO2: 32 mmol/L (ref 22–32)
Calcium: 9.5 mg/dL (ref 8.9–10.3)
Chloride: 103 mmol/L (ref 98–111)
Creatinine, Ser: 0.72 mg/dL (ref 0.44–1.00)
GFR, Estimated: >60 mL/min
Glucose, Bld: 100 mg/dL — ABNORMAL HIGH (ref 70–99)
Potassium: 3.6 mmol/L (ref 3.5–5.1)
Sodium: 142 mmol/L (ref 135–145)
Total Bilirubin: 0.4 mg/dL (ref 0.3–1.2)
Total Protein: 7.6 g/dL (ref 6.5–8.1)

## 2020-12-28 LAB — CBC WITH DIFFERENTIAL/PLATELET
Abs Immature Granulocytes: 0.01 10*3/uL (ref 0.00–0.07)
Basophils Absolute: 0 10*3/uL (ref 0.0–0.1)
Basophils Relative: 0 %
Eosinophils Absolute: 0.2 10*3/uL (ref 0.0–0.5)
Eosinophils Relative: 3 %
HCT: 38 % (ref 36.0–46.0)
Hemoglobin: 12 g/dL (ref 12.0–15.0)
Immature Granulocytes: 0 %
Lymphocytes Relative: 42 %
Lymphs Abs: 3.3 10*3/uL (ref 0.7–4.0)
MCH: 30.1 pg (ref 26.0–34.0)
MCHC: 31.6 g/dL (ref 30.0–36.0)
MCV: 95.2 fL (ref 80.0–100.0)
Monocytes Absolute: 0.7 10*3/uL (ref 0.1–1.0)
Monocytes Relative: 9 %
Neutro Abs: 3.6 10*3/uL (ref 1.7–7.7)
Neutrophils Relative %: 46 %
Platelets: 332 10*3/uL (ref 150–400)
RBC: 3.99 MIL/uL (ref 3.87–5.11)
RDW: 14.4 % (ref 11.5–15.5)
WBC: 7.9 10*3/uL (ref 4.0–10.5)
nRBC: 0 % (ref 0.0–0.2)

## 2020-12-28 LAB — URINALYSIS, ROUTINE W REFLEX MICROSCOPIC
Bilirubin Urine: NEGATIVE
Glucose, UA: NEGATIVE mg/dL
Ketones, ur: NEGATIVE mg/dL
Nitrite: NEGATIVE
Specific Gravity, Urine: 1.031 — ABNORMAL HIGH (ref 1.005–1.030)
pH: 6 (ref 5.0–8.0)

## 2020-12-28 LAB — PREGNANCY, URINE: Preg Test, Ur: NEGATIVE

## 2020-12-28 LAB — TROPONIN I (HIGH SENSITIVITY): Troponin I (High Sensitivity): <2 ng/L (ref <18)

## 2020-12-28 LAB — D-DIMER, QUANTITATIVE: D-Dimer, Quant: 0.37 ug/mL-FEU (ref 0.00–0.50)

## 2020-12-28 MED ORDER — DOXYCYCLINE HYCLATE 100 MG PO CAPS: 100.0000 mg | ORAL_CAPSULE | Freq: Two times a day (BID) | ORAL | 0 refills | Status: DC

## 2020-12-28 NOTE — ED Provider Notes (Signed)
[facility] EMERGENCY DEPT Provider Note   CSN: [id number] Arrival date & time: 12/28/20  0252     History Chief Complaint  Patient presents with   Flank Pain    Bilateral   Fatigue    Heather Lynch is a 35 y.o. female.  HPI     1 week of severe fatigue, feeling of just wanting to go lay down and go to sleep. Nausea.  Waxes and wanes throughout the day, if busy does not notice it as much.  Wake up in middle of the night, feels like a heaviness like ate a big steak dinner before bed, sluggish, nauseas.  No vomiting.  No known fevers.  Daughter just had baby at age of 62 10 days ago, under a lot of stress, CPS coming to house. Pain left side of back.  Off and on dyspnea.  Sore lower back and flank pain like just worked out.  Low appetite.  Had some mild dysuria.  Thursday child's grandmother had COVID, Son also had runny nose, Daughter being tested tonight as she had sore throat..  Stool looser. No abdominal pain. Throat feels a little funny ut improved.   No congestion or cough.  Was seen at [facility] and diagnosed with BV, trichomoniasis, UTI.  She was given a prescription for Cipro, but initially waited to start taking it as she was breast-feeding.  At this point, she has taken nearly all of it with her last day supposed to be today, however she did not feel well today and did not take it.  She is worried about worsening infection and wanted to be evaluated.   Past Medical History:  Diagnosis Date   Anemia    Depression    refuses to take meds   Gonorrhea    Heart murmur    Pneumonia    Preterm delivery 10/20/2018   Spontaneous abortion 10/20/2018   Urinary tract infection     Patient Active Problem List   Diagnosis Date Noted   Melena 04/12/2019   Abdominal pain 04/12/2019   Vulvovaginal candidiasis 04/12/2019   Leucocytosis 12/26/2018   Acute pyelonephritis 12/26/2018   Sepsis due to gram-negative UTI (HCC) 12/26/2018    Past Surgical History:  Procedure  Laterality Date   CESAREAN SECTION     X 5   DILATION AND CURETTAGE OF UTERUS N/A 10/20/2018   Procedure: DILATATION AND CURETTAGE;  Surgeon: Adam Phenix, MD;  Location: [facility];  Service: Gynecology;  Laterality: N/A;     OB History     Gravida  9   Para  8   Term  6   Preterm  1   AB      Living  7      SAB  0   IAB      Ectopic      Multiple  0   Live Births  7           Family History  Problem Relation Age of Onset   Hypertension Other    Other Neg Hx     Social History   Tobacco Use   Smoking status: Former    Types: Cigarettes    Quit date: 12/30/2011    Years since quitting: 9.0   Smokeless tobacco: Never  Vaping Use   Vaping Use: Never used  Substance Use Topics   Alcohol use: Yes    Comment: occasional   Drug use: Yes    Types: Marijuana    Comment:  LAST SMOKED - NOV    Home Medications Prior to Admission medications   Medication Sig Start Date End Date Taking? Authorizing Provider  doxycycline (VIBRAMYCIN) 100 MG capsule Take 1 capsule (100 mg total) by mouth 2 (two) times daily for 14 days. 12/28/20 01/11/21 Yes Alvira Monday, MD  cephALEXin (KEFLEX) 500 MG capsule Take 500 mg by mouth 3 (three) times daily.    [provider]  ELDERBERRY PO Take 3 capsules by mouth daily.    [provider]    Allergies    Patient has no known allergies.  Review of Systems   Review of Systems  Constitutional:  Positive for appetite change, chills and fatigue. Negative for fever.  Respiratory:  Negative for cough and shortness of breath.   Cardiovascular:  Negative for chest pain.  Gastrointestinal:  Positive for nausea. Negative for abdominal pain, constipation, diarrhea (morel oose) and vomiting.  Genitourinary:  Negative for vaginal bleeding and vaginal discharge.  Musculoskeletal:  Positive for back pain.  Skin:  Negative for rash.  Neurological:  Negative for headaches.   Physical Exam Updated Vital Signs BP  132/89 (BP Location: Right Arm)   Pulse 63   Temp 97.9 F (36.6 C) (Oral)   Resp 16   Ht 5\' 8"  (1.727 m)   Wt (!) 137.4 kg   LMP  (LMP Unknown)   SpO2 99%   BMI 46.07 kg/m   Physical Exam Vitals and nursing note reviewed.  Constitutional:      General: She is not in acute distress.    Appearance: She is well-developed. She is not diaphoretic.  HENT:     Head: Normocephalic and atraumatic.  Eyes:     Conjunctiva/sclera: Conjunctivae normal.  Cardiovascular:     Rate and Rhythm: Normal rate and regular rhythm.     Heart sounds: Normal heart sounds. No murmur heard.   No friction rub. No gallop.  Pulmonary:     Effort: Pulmonary effort is normal. No respiratory distress.     Breath sounds: Normal breath sounds. No wheezing or rales.  Abdominal:     General: There is no distension.     Palpations: Abdomen is soft.     Tenderness: There is no abdominal tenderness. There is no guarding.  Musculoskeletal:        General: No tenderness.     Cervical back: Normal range of motion.  Skin:    General: Skin is warm and dry.     Findings: No erythema or rash.  Neurological:     Mental Status: She is alert and oriented to person, place, and time.    ED Results / Procedures / Treatments   Labs (all labs ordered are listed, but only abnormal results are displayed) Labs Reviewed  URINALYSIS, ROUTINE W REFLEX MICROSCOPIC - Abnormal; Notable for the following components:      Result Value   Specific Gravity, Urine 1.031 (*)    Hgb urine dipstick TRACE (*)    Protein, ur TRACE (*)    Leukocytes,Ua TRACE (*)    All other components within normal limits  COMPREHENSIVE METABOLIC PANEL - Abnormal; Notable for the following components:   Glucose, Bld 100 (*)    All other components within normal limits  RESP PANEL BY RT-PCR (FLU A&B, COVID) ARPGX2  URINE CULTURE  PREGNANCY, URINE  CBC WITH DIFFERENTIAL/PLATELET  D-DIMER, QUANTITATIVE  LYME DISEASE DNA BY PCR(BORRELIA BURG)   TROPONIN I (HIGH SENSITIVITY)    EKG EKG Interpretation  Date/Time:  Thursday December 28 2020 04:54:57 EDT Ventricular Rate:  77 PR Interval:  221 QRS Duration: 83 QT Interval:  410 QTC Calculation: 464 R Axis:   82 Text Interpretation: Sinus rhythm Prolonged PR interval No significant change since last tracing Confirmed by Alvira Monday ([id number]) on 12/28/2020 5:12:24 AM  Radiology DG Chest Portable 1 View  Result Date: 12/28/2020 CLINICAL DATA:  35 year old female with shortness of breath. One week of flank pain. Malaise. EXAM: PORTABLE CHEST 1 VIEW COMPARISON:  Chest radiographs 03/03/2019 and earlier. FINDINGS: Portable AP upright view at 0431 hours. Lung volumes and mediastinal contours are stable, within normal limits. Allowing for portable technique the lungs are clear. No pneumothorax or pleural effusion. Visualized tracheal air column is within normal limits. No osseous abnormality identified. Negative visible bowel gas. IMPRESSION: Negative portable chest. Electronically Signed   By: Odessa Fleming M.D.   On: 12/28/2020 04:46    Procedures Procedures   Medications Ordered in ED Medications - No data to display  ED Course  I have reviewed the triage vital signs and the nursing notes.  Pertinent labs & imaging results that were available during my care of the patient were reviewed by me and considered in my medical decision making (see chart for details).    MDM Rules/Calculators/A&P                           35 year old female with a history of anemia, prior history of sepsis secondary to urinary source, recent diagnosis of UTI, who presents with concern for severe fatigue, nausea, body ache.  Had urine culture July 10 which showed ESBL E. coli resistant to cephalosporins and Bactrim.  She has a benign abdominal exam.  Vital signs are not consistent with sepsis.  Hypertension is likely in the setting of stress, recommend continued follow-up with PCP.  History is not consistent  with nephrolithiasis.  COVID and flu testing were obtained which are negative.  Urinalysis today without signs of continued infection.  CBC and CMP show no significant abnormalities.  EKG and chest x-ray show no sign of acute abnormalities. Troponin and ddimer negative.    Unclear etiology of symptoms, possible stress as contributor. Reports possible tick exposure 2 weeks ago, feel it is reasonable to rx doxycycline for possible tickborn illness.  Patient discharged in stable condition with understanding of reasons to return.   Final Clinical Impression(s) / ED Diagnoses Final diagnoses:  Fatigue, unspecified type    Rx / DC Orders ED Discharge Orders          Ordered    doxycycline (VIBRAMYCIN) 100 MG capsule  2 times daily        12/28/20 3299             Alvira Monday, MD 12/29/20 0022

## 2020-12-28 NOTE — ED Triage Notes (Signed)
Patient here POV from Home with Malaise and Bilateral Flank Pain for approximately 1 week PTA.  Patient has been recently treated UTI with PO Cipro at Home. Moderate Nausea  Ambulatory, GCS 15. NAD Noted. No Fevers. No Vomiting.

## 2020-12-28 NOTE — ED Notes (Signed)
#  20 IV removed form left AC

## 2020-12-30 LAB — URINE CULTURE: Culture: 20000 — AB

## 2021-01-01 ENCOUNTER — Telehealth: Payer: Self-pay

## 2021-01-01 IMAGING — DX DG CHEST 2V
2 series · 2 of 2 positions shown · non-contrast
Comparison: 12/26/2018

CLINICAL DATA: Palpitation and chest discomfort

EXAM:
CHEST - 2 VIEW

[chest lat]
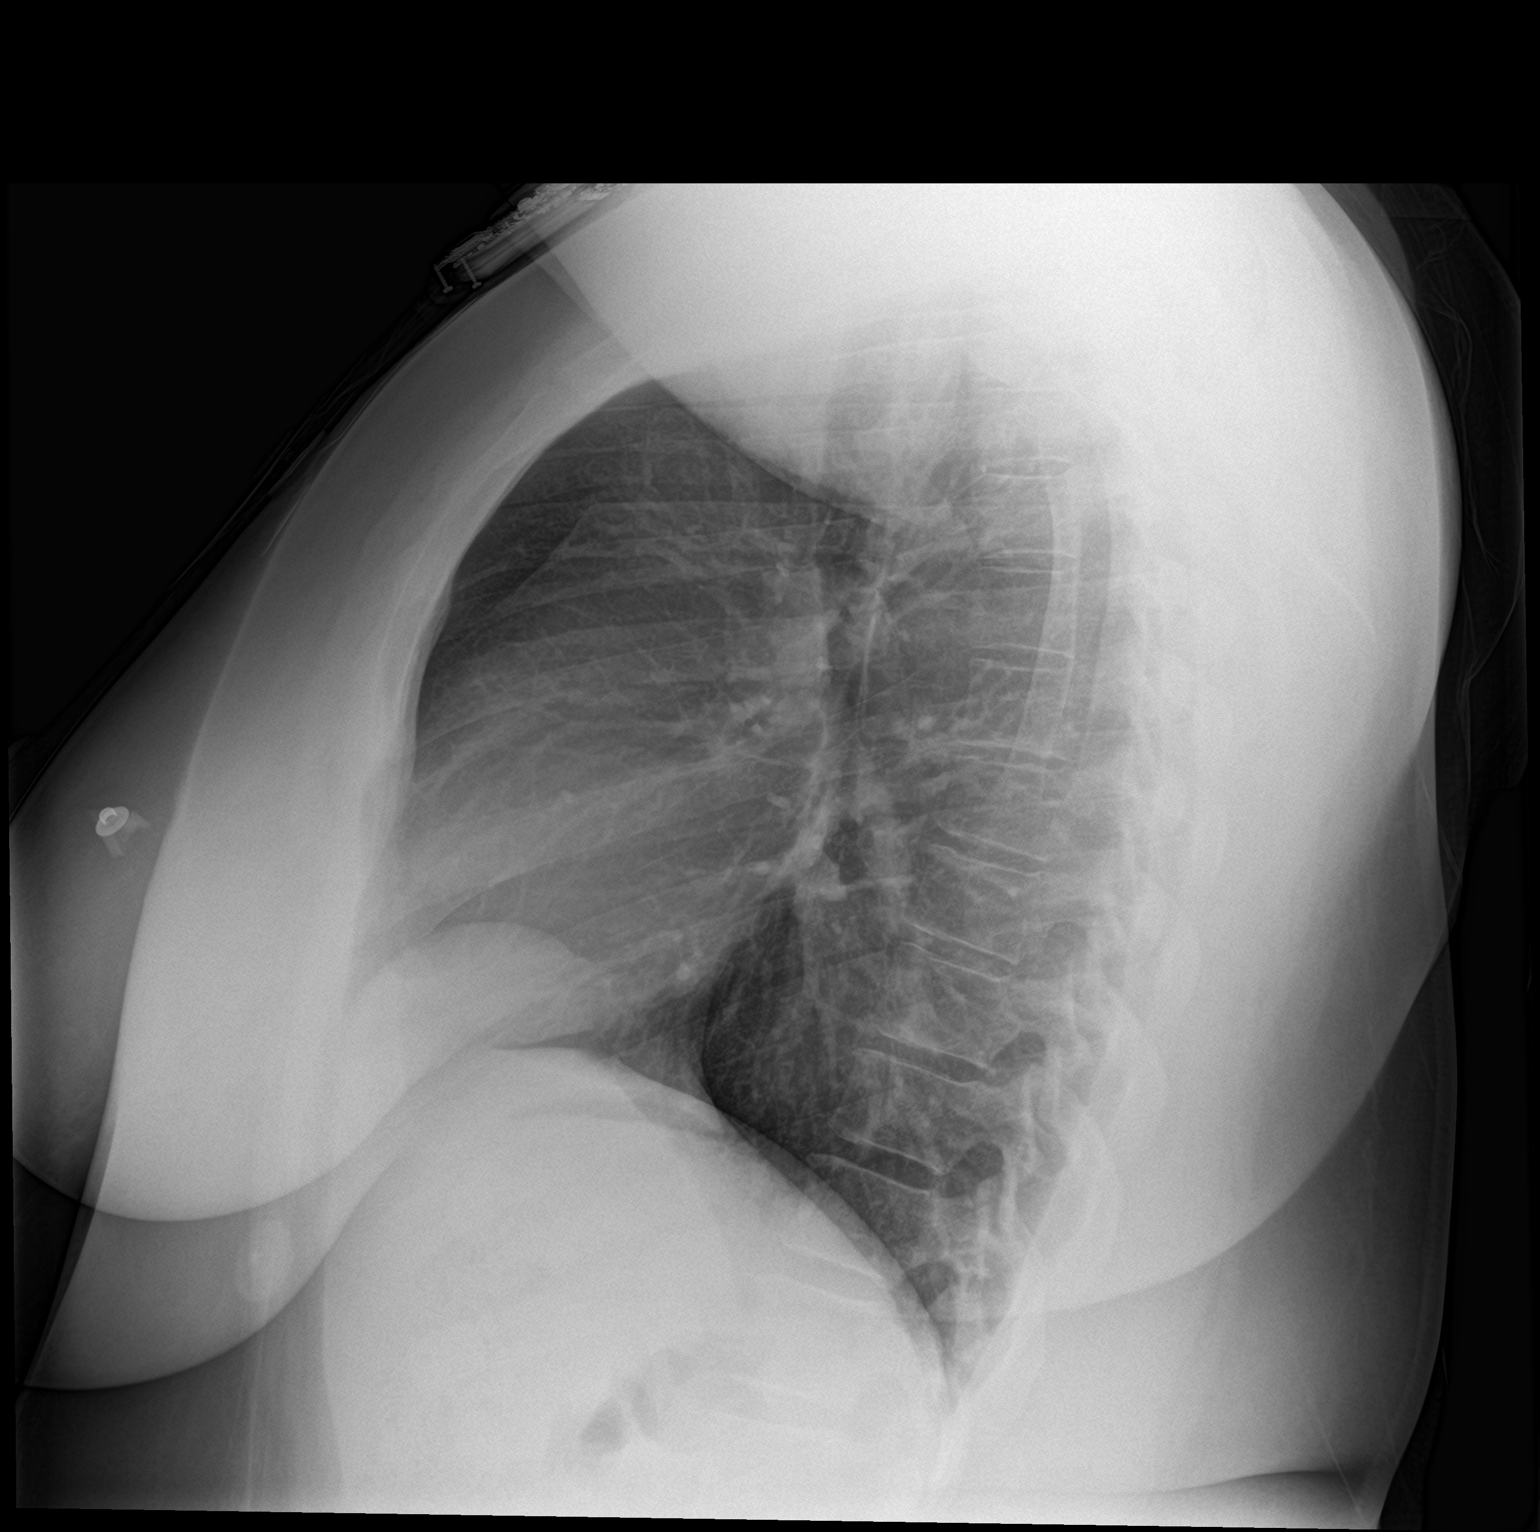

[chest pa]
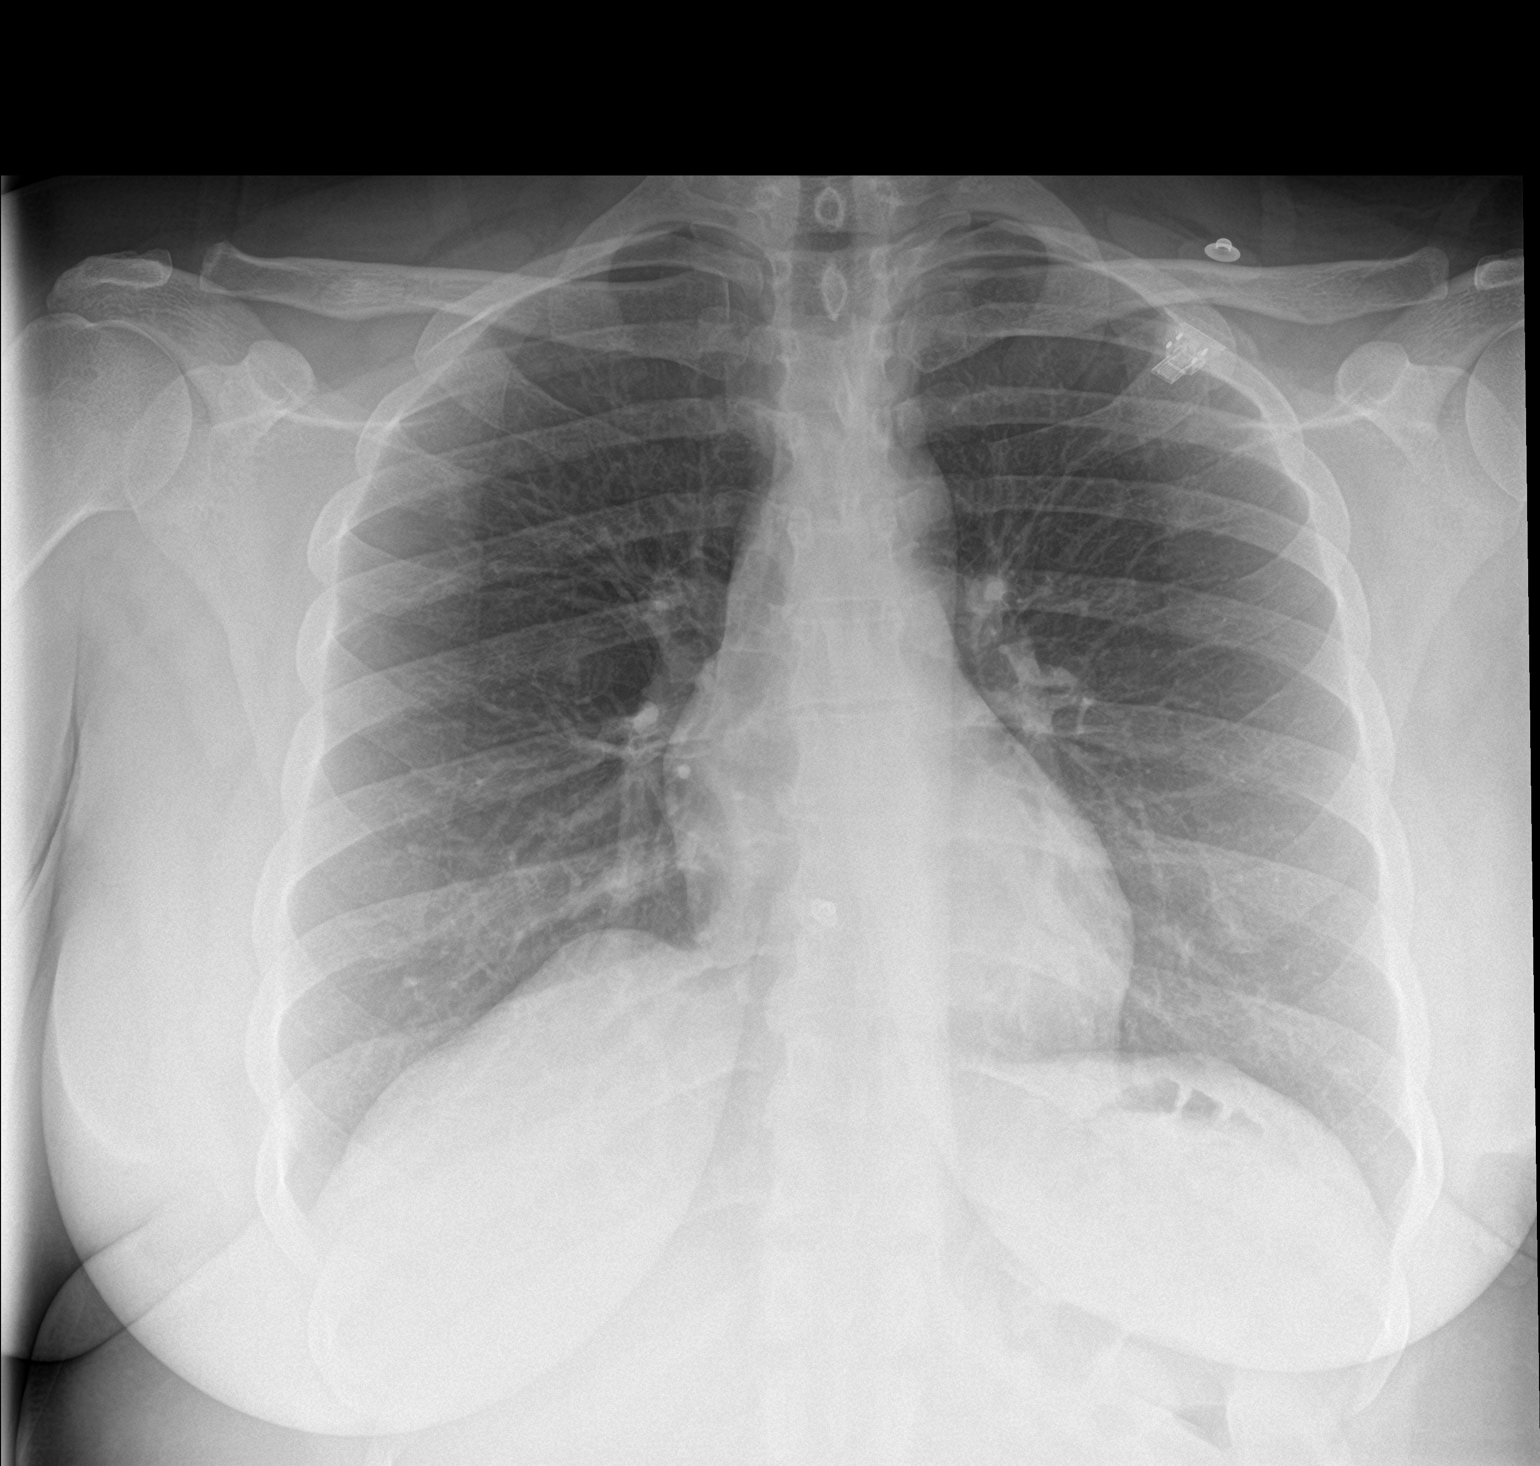

[2 of 2 positions shown; findings below may reference images not displayed]

FINDINGS: The heart size and mediastinal contours are within normal limits.
Both lungs are clear. The visualized skeletal structures are
unremarkable.
IMPRESSION: No active cardiopulmonary disease.

## 2021-01-01 NOTE — Telephone Encounter (Signed)
Post ED Visit - Positive Culture Follow-up  Culture report reviewed by antimicrobial stewardship pharmacist: [facility] []  , Pharm.D. []  Enzo Bi, Pharm.D., BCPS AQ-ID [x]  , Pharm.D., BCPS []  Celedonio Miyamoto, Pharm.D., BCPS []  [place], Delmar Landau.D., BCPS, AAHIVP []  , Pharm.D., BCPS, AAHIVP []  Georgina Pillion, PharmD, BCPS []  , PharmD, BCPS []  [place], PharmD, BCPS []  [address], PharmD []  , PharmD, BCPS []  Estella Husk, PharmD  Pharmacy Team []  Lysle Pearl, PharmD []  , PharmD []  Phillips Climes, PharmD []  , Rph []  Agapito Games) , PharmD []  Verlan Friends, PharmD []  , PharmD []  Mervyn Gay, PharmD []  , PharmD []  Vinnie Level, PharmD []  [facility], PharmD []  , PharmD []  Len Childs, PharmD   Positive urine culture Treated with Doxycycline Hyclate, organism sensitive to the same and no further patient follow-up is required at this time.  01/01/2021, 9:38 AM

## 2021-01-02 LAB — LYME DISEASE DNA BY PCR(BORRELIA BURG): Lyme Disease(B.burgdorferi)PCR: NEGATIVE

## 2021-01-08 ENCOUNTER — Encounter (HOSPITAL_BASED_OUTPATIENT_CLINIC_OR_DEPARTMENT_OTHER): Payer: Self-pay

## 2021-01-08 ENCOUNTER — Other Ambulatory Visit: Payer: Self-pay

## 2021-01-08 DIAGNOSIS — F410 Panic disorder [episodic paroxysmal anxiety] without agoraphobia: Secondary | ICD-10-CM | POA: Insufficient documentation

## 2021-01-08 DIAGNOSIS — Z87891 Personal history of nicotine dependence: Secondary | ICD-10-CM | POA: Insufficient documentation

## 2021-01-08 DIAGNOSIS — R0602 Shortness of breath: Secondary | ICD-10-CM | POA: Insufficient documentation

## 2021-01-08 DIAGNOSIS — R072 Precordial pain: Secondary | ICD-10-CM | POA: Insufficient documentation

## 2021-01-08 DIAGNOSIS — R079 Chest pain, unspecified: Secondary | ICD-10-CM | POA: Diagnosis present

## 2021-01-08 NOTE — ED Triage Notes (Signed)
Presents with intermittent chest pain and dizziness.  Also reports frequent urination.  Patient reports she is very stressed and was on the phone and became very dizziness with chest pain. EMS checked her out.  Has a 35 yr old daughter who has run away after having a baby two weeks ago and the state took the baby because her daughter wouldn't get the baby.  She reports that just a small portion of stress she is dealing with.

## 2021-01-09 ENCOUNTER — Emergency Department (HOSPITAL_BASED_OUTPATIENT_CLINIC_OR_DEPARTMENT_OTHER)
Admission: EM | Admit: 2021-01-09 | Discharge: 2021-01-09 | Disposition: A | Discharge status: Home / Self Care | Payer: Medicaid Other | Attending: Emergency Medicine | Admitting: Emergency Medicine

## 2021-01-09 DIAGNOSIS — F410 Panic disorder [episodic paroxysmal anxiety] without agoraphobia: Secondary | ICD-10-CM

## 2021-01-09 DIAGNOSIS — F430 Acute stress reaction: Secondary | ICD-10-CM

## 2021-01-09 DIAGNOSIS — R072 Precordial pain: Secondary | ICD-10-CM

## 2021-01-09 LAB — URINALYSIS, ROUTINE W REFLEX MICROSCOPIC
Bilirubin Urine: NEGATIVE
Glucose, UA: NEGATIVE mg/dL
Hgb urine dipstick: NEGATIVE
Ketones, ur: NEGATIVE mg/dL
Leukocytes,Ua: NEGATIVE
Nitrite: NEGATIVE
Specific Gravity, Urine: 1.022 (ref 1.005–1.030)
pH: 6 (ref 5.0–8.0)

## 2021-01-09 LAB — PREGNANCY, URINE: Preg Test, Ur: NEGATIVE

## 2021-01-09 MED ORDER — LORAZEPAM 0.5 MG PO TABS: 0.5000 mg | ORAL_TABLET | Freq: Three times a day (TID) | ORAL | 0 refills | Status: AC | PRN

## 2021-01-09 NOTE — ED Provider Notes (Signed)
MEDCENTER [facility] EMERGENCY DEPT Provider Note   CSN: [id number] Arrival date & time: 01/08/21  2242     History Chief Complaint  Patient presents with   Chest Pain    Heather Lynch is a 35 y.o. female.  The history is provided by the patient.  Anxiety This is a new problem. The problem occurs constantly. Associated symptoms include chest pain and shortness of breath. The symptoms are aggravated by stress. The symptoms are relieved by rest.  Patient presents with chest pain.  Patient reports that she had a very stressful discussion about her daughter.  Soon after she began feeling lightheaded and that she might pass out and felt very anxious.  She called EMS, and soon after that she began having brief sharp left-sided chest pain that would only last a few seconds.  She reports she was evaluated by EMS but was not transported to the hospital  She reports significant stress recently due to the fact that her teenage daughter recently delivered her baby.  She also reports her son is having difficulty with Patent examiner.    Past Medical History:  Diagnosis Date   Anemia    Depression    refuses to take meds   Gonorrhea    Heart murmur    Pneumonia    Preterm delivery 10/20/2018   Spontaneous abortion 10/20/2018   Urinary tract infection     Patient Active Problem List   Diagnosis Date Noted   Melena 04/12/2019   Abdominal pain 04/12/2019   Vulvovaginal candidiasis 04/12/2019   Leucocytosis 12/26/2018   Acute pyelonephritis 12/26/2018   Sepsis due to gram-negative UTI (HCC) 12/26/2018    Past Surgical History:  Procedure Laterality Date   CESAREAN SECTION     X 5   DILATION AND CURETTAGE OF UTERUS N/A 10/20/2018   Procedure: DILATATION AND CURETTAGE;  Surgeon: Adam Phenix, MD;  Location: [facility] LD [facility]S;  Service: Gynecology;  Laterality: N/A;     OB History     Gravida  9   Para  8   Term  6   Preterm  1   AB      Living  7      SAB  0   IAB       Ectopic      Multiple  0   Live Births  7           Family History  Problem Relation Age of Onset   Hypertension Other    Other Neg Hx     Social History   Tobacco Use   Smoking status: Former    Types: Cigarettes    Quit date: 12/30/2011    Years since quitting: 9.0   Smokeless tobacco: Never  Vaping Use   Vaping Use: Never used  Substance Use Topics   Alcohol use: Yes    Comment: occasional   Drug use: Yes    Types: Marijuana    Comment: LAST SMOKED - NOV    Home Medications Prior to Admission medications   Medication Sig Start Date End Date Taking? Authorizing Provider  LORazepam (ATIVAN) 0.5 MG tablet Take 1 tablet (0.5 mg total) by mouth every 8 (eight) hours as needed for anxiety. 01/09/21  Yes Zadie Rhine, MD  ELDERBERRY PO Take 3 capsules by mouth daily.    [provider]    Allergies    Patient has no known allergies.  Review of Systems   Review of Systems  Constitutional:  Negative  for fever.  Respiratory:  Positive for shortness of breath.   Cardiovascular:  Positive for chest pain.  Genitourinary:  Positive for frequency.  Psychiatric/Behavioral:  The patient is nervous/anxious.   All other systems reviewed and are negative.  Physical Exam Updated Vital Signs BP (!) 111/55   Pulse 71   Temp 98.4 F (36.9 C) (Oral)   Resp 16   Ht 1.727 m (5\' 8" )   Wt (!) 137 kg   SpO2 100%   BMI 45.92 kg/m   Physical Exam CONSTITUTIONAL: Well developed/well nourished, no acute distress HEAD: Normocephalic/atraumatic EYES: EOMI NECK: supple no meningeal signs CV: S1/S2 noted, no murmurs/rubs/gallops noted LUNGS: Lungs are clear to auscultation bilaterally, no apparent distress ABDOMEN: soft, nontender NEURO: Pt is awake/alert/appropriate, moves all extremitiesx4.  No facial droop.   EXTREMITIES: pulses normal/equal, full ROM, no calf edema noted SKIN: warm, color normal PSYCH: no abnormalities of mood noted, alert and oriented  to situation  ED Results / Procedures / Treatments   Labs (all labs ordered are listed, but only abnormal results are displayed) Labs Reviewed  URINALYSIS, ROUTINE W REFLEX MICROSCOPIC - Abnormal; Notable for the following components:      Result Value   Protein, ur TRACE (*)    All other components within normal limits  PREGNANCY, URINE    EKG EKG Interpretation  Date/Time:  Tuesday January 09 2021 00:56:08 EDT Ventricular Rate:  69 PR Interval:  211 QRS Duration: 97 QT Interval:  437 QTC Calculation: 469 R Axis:   78 Text Interpretation: Sinus rhythm Prolonged PR interval Confirmed by 06-16-2003 (Zadie Rhine) on 01/09/2021 1:16:50 AM  Radiology No results found.  Procedures Procedures   Medications Ordered in ED Medications - No data to display  ED Course  I have reviewed the triage vital signs and the nursing notes.  Pertinent labs  results that were available during my care of the patient were reviewed by me and considered in my medical decision making (see chart for details).    MDM Rules/Calculators/A&P                           Appears the patient is describing panic attacks.  She reports her symptoms typically revolve around stressful conversations about her family.  She reports this time it seemed worse and she had a brief chest pain episode with it.  She now appears improved  We had a long discussion.  Patient is under tremendous stress due to her family.  I feel she would benefit from an evaluation at our behavioral health urgent care.  Patient is agreeable with this plan.  Patient is also amenable to trying a short course of Ativan as needed for any panic attacks. She plans to go to the urgent care later in the day after she takes her child to daycare We discussed appropriate use of this and not to drive while taking it. She is no longer breast-feeding Final Clinical Impression(s) / ED Diagnoses Final diagnoses:  Precordial pain  Panic attack due to  exceptional stress    Rx / DC Orders ED Discharge Orders          Ordered    LORazepam (ATIVAN) 0.5 MG tablet  Every 8 hours PRN        01/09/21 0159             01/11/21, MD 01/09/21 0209

## 2021-08-15 ENCOUNTER — Other Ambulatory Visit: Payer: Self-pay

## 2021-08-15 ENCOUNTER — Ambulatory Visit
Admission: EM | Admit: 2021-08-15 | Discharge: 2021-08-15 | Disposition: A | Discharge status: Home / Self Care | Payer: Medicaid Other

## 2021-08-15 ENCOUNTER — Ambulatory Visit (HOSPITAL_COMMUNITY)
Admission: EM | Admit: 2021-08-15 | Discharge: 2021-08-15 | Discharge status: Against Medical Advice | Payer: Medicaid Other

## 2021-08-15 DIAGNOSIS — F411 Generalized anxiety disorder: Secondary | ICD-10-CM | POA: Diagnosis not present

## 2021-08-15 NOTE — ED Provider Notes (Signed)
?[name] ? ? ? ?CSN: [id number] ?Arrival date & time: 08/15/21  1351 ? ? ?  ? ?History   ?Chief Complaint ?Chief Complaint  ?Patient presents with  ? Mental Health Problem  ? ? ?HPI ?Heather Lynch is a 36 y.o. female.  ? ?Patient presents today due to concerns of worsening anxiety.  Patient reports that she has been dealing with anxiety for multiple months but it has increased over the past few weeks. Associated symptoms include palpitations, dizziness, hand tremors, feelings of bilateral arm numbness, "racing thoughts", "chitter chatter voices".  She has been evaluated in ED 2-3 times since symptoms started and has been told that her symptoms are related to anxiety.  She was prescribed Ativan in August to take as needed.  Her [facility] ED [facility]  [facility] follow-up with neurology for symptoms of hand tremors but patient reports that she has not done this.  Last episode of palpitations and associated symptoms with panic attack was noted yesterday.  Denies any symptoms today.  Patient reports that she has had several stressors in her life lately that include her son being sent to a youth prison, her daughter becoming pregnant at 36 years old and then subsequently running away, and her father dying in prison.  Patient reports that she has not been to work in approximately 11 days because she does not want to leave her house as anxiety increases when this occurs.  Patient denies suicidal ideation or any plan to harm herself. ? ? ?Mental Health Problem ? ?Past Medical History:  ?Diagnosis Date  ? Anemia   ? Depression   ? refuses to take meds  ? Gonorrhea   ? Heart murmur   ? Pneumonia   ? Preterm delivery 10/20/2018  ? Spontaneous abortion 10/20/2018  ? Urinary tract infection   ? ? ?Patient Active Problem List  ? Diagnosis Date Noted  ? Melena 04/12/2019  ? Abdominal pain 04/12/2019  ? Vulvovaginal candidiasis 04/12/2019  ? Leucocytosis  12/26/2018  ? Acute pyelonephritis 12/26/2018  ? Sepsis due to gram-negative UTI (HCC) 12/26/2018  ? ? ?Past Surgical History:  ?Procedure Laterality Date  ? CESAREAN SECTION    ? X 5  ? DILATION AND CURETTAGE OF UTERUS N/A 10/20/2018  ? Procedure: DILATATION AND CURETTAGE;  Surgeon: Adam PhenixArnold, James G, MD;  Location: [facility] LD [facility]S;  Service: Gynecology;  Laterality: N/A;  ? ? ?OB History   ? ? Gravida  ?9  ? Para  ?8  ? Term  ?6  ? Preterm  ?1  ? AB  ?   ? Living  ?7  ?  ? ? SAB  ?0  ? IAB  ?   ? Ectopic  ?   ? Multiple  ?0  ? Live Births  ?7  ?   ?  ?  ? ? ? ?Home Medications   ? ?Prior to Admission medications   ?Medication Sig Start Date End Date Taking? Authorizing Provider  ?ELDERBERRY PO Take 3 capsules by mouth daily.    [provider]  ?LORazepam (ATIVAN) 0.5 MG tablet Take 1 tablet (0.5 mg total) by mouth every 8 (eight) hours as needed for anxiety. 01/09/21   Zadie RhineWickline, Donald, MD  ? ? ?Family History ?Family History  ?Problem Relation Age of Onset  ? Hypertension Other   ? Other Neg Hx   ? ? ?Social History ?Social History  ? ?  Tobacco Use  ? Smoking status: Former  ?  Types: Cigarettes  ?  Quit date: 12/30/2011  ?  Years since quitting: 9.6  ? Smokeless tobacco: Never  ?Vaping Use  ? Vaping Use: Never used  ?Substance Use Topics  ? Alcohol use: Yes  ?  Comment: occasional  ? Drug use: Yes  ?  Types: Marijuana  ?  Comment: LAST SMOKED - NOV  ? ? ? ?Allergies   ?Patient has no known allergies. ? ? ?Review of Systems ?Review of Systems ?Per HPI ? ?Physical Exam ?Triage Vital Signs ?ED Triage Vitals  ?Enc Vitals Group  ?   BP 08/15/21 1416 140/87  ?   Pulse Rate 08/15/21 1416 73  ?   Resp 08/15/21 1416 20  ?   Temp 08/15/21 1416 98.5 ?F (36.9 ?C)  ?   Temp Source 08/15/21 1416 Oral  ?   SpO2 08/15/21 1416 100 %  ?   Weight --   ?   Height --   ?   Head Circumference --   ?   Peak Flow --   ?   Pain Score 08/15/21 1424 0  ?   Pain Loc --   ?   Pain Edu? --   ?   Excl. in GC? --   ? ?No data found. ? ?Updated  Vital Signs ?BP 140/87 (BP Location: Left Arm)   Pulse 73   Temp 98.5 ?F (36.9 ?C) (Oral)   Resp 20   SpO2 100%   Breastfeeding No  ? ?Visual Acuity ?Right Eye Distance:   ?Left Eye Distance:   ?Bilateral Distance:   ? ?Right Eye Near:   ?Left Eye Near:    ?Bilateral Near:    ? ?Physical Exam ?Constitutional:   ?   General: She is not in acute distress. ?   Appearance: Normal appearance. She is not toxic-appearing or diaphoretic.  ?   Comments: Patient is tearful  ?HENT:  ?   Head: Normocephalic and atraumatic.  ?Eyes:  ?   Extraocular Movements: Extraocular movements intact.  ?   Conjunctiva/sclera: Conjunctivae normal.  ?Cardiovascular:  ?   Rate and Rhythm: Normal rate and regular rhythm.  ?   Pulses: Normal pulses.  ?   Heart sounds: Normal heart sounds.  ?Pulmonary:  ?   Effort: Pulmonary effort is normal. No respiratory distress.  ?   Breath sounds: Normal breath sounds.  ?Neurological:  ?   General: No focal deficit present.  ?   Mental Status: She is alert and oriented to person, place, and time. Mental status is at baseline.  ?Psychiatric:     ?   Mood and Affect: Mood normal.     ?   Behavior: Behavior normal.     ?   Thought Content: Thought content normal.     ?   Judgment: Judgment normal.  ? ? ? ?UC Treatments / Results  ?Labs ?(all labs ordered are listed, but only abnormal results are displayed) ?Labs Reviewed - No data to display ? ?EKG ? ? ?Radiology ?No results found. ? ?Procedures ?Procedures (including critical care time) ? ?Medications Ordered in UC ?Medications - No data to display ? ?Initial Impression / Assessment and Plan / UC Course  ?I have reviewed the triage vital signs and the nursing notes. ? ?Pertinent labs & imaging results that were available during my care of the patient were reviewed by me and considered in my medical decision making (see chart for details). ? ?  ? ?  Patient was advised that there are limited resources here at the urgent care to help alleviate anxiety and  give proper evaluation.  Patient was advised that she would benefit from further evaluation and management at the behavioral health urgent care here in [facility].  Although, patient was also advised that typically it is  recommended to be medically cleared before going to the urgent care.  Patient declined to go to the hospital to be medically cleared and declined EKG or any further medical evaluation here at the urgent care.  She wishes to go to the behavioral health urgent care as soon as she leaves this urgent care for further evaluation and management.  Risks associated with not being medically cleared were discussed with patient.  Patient voiced understanding.  I do think that it is reasonable for patient to leave here and go to behavioral urgent care as she has been evaluated several times over the past few months in emergency departments and has been diagnosed with anxiety.  vital signs stable at discharge and patient does not have any plan to harm herself so do think that self transport is reasonable and safe.  Patient left via self transport after being provided with address to behavioral health urgent care today. ?Final Clinical Impressions(s) / UC Diagnoses  ? ?Final diagnoses:  ?Anxiety state  ? ? ? ?Discharge Instructions   ? ?  ?Please go to [facility] behavioral health urgent care today as soon as you leave this urgent care for further evaluation and management. ? ? ? ?ED Prescriptions   ?None ?  ? ?PDMP not reviewed this encounter. ?  ?Gustavus Bryant, [place] ?08/15/21 1516 ? ?

## 2021-08-15 NOTE — ED Triage Notes (Signed)
Pt c/o dizziness, anxiety, "heart starts messing with me," fine hand tremor,  ? ?Denies thoughts of harming self. ? ?States has completed an intake form for a therapist to see Sunday at 1p. Says she is coming for care today b/c she has racing thoughts that cause her to not be able to catch her breath.  ? ?States daughter "ran away" 5-6 months ago, son is in a youth prison. Pt has been out of work since 14th, and has other kids to care for but unable to get energy to shower sometimes. Pt teary-eyed in triage. Feels like has no control of her life. Received rx for ativan in aug 2022 but reports not getting the prescription stating she thought she could work through her sxs without pharmacy intervention.  ?

## 2021-08-15 NOTE — Discharge Instructions (Signed)
Please go to [facility] behavioral health urgent care today as soon as you leave this urgent care for further evaluation and management. ?
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
## 2021-08-17 ENCOUNTER — Ambulatory Visit (INDEPENDENT_AMBULATORY_CARE_PROVIDER_SITE_OTHER): Payer: Medicaid Other | Admitting: Clinical

## 2021-08-17 DIAGNOSIS — F331 Major depressive disorder, recurrent, moderate: Secondary | ICD-10-CM | POA: Diagnosis not present

## 2021-08-18 NOTE — Progress Notes (Signed)
?  THERAPIST PROGRESS NOTE ? ?Session Time: 30 minutes ? ?Participation Level: Active ? ?Behavioral Response: CasualAlertDepressed ? ?Type of Therapy: Individual Therapy ? ?Treatment Goals addressed: client will follow through on referral for outpatient treatment ? ?ProgressTowards Goals: Initial ? ?Interventions: CBT ? ?Summary:  ?Heather Lynch is a 36 y.o. female who presents for the walk in session oriented times five, appropriately dressed, and friendly. Client is referred by the [facility] urgent care for a follow up appointment. Client went to the [facility] urgent care on 08/15/2021 for reoccurring symptoms of anxiety. Client reported over the past year she has been having family stressors that are overwhelming for her to deal with. Client reported her 38 year old son has been incarcerated and her now 31 year old daughter became pregnant and ran away from home. Client reported she has 8 children total. Client reported she has gone through court involvement, losing her section 8 and health problems. Client reported she has recently been out of work over the past week because of her anxiety. Client reported it has put a strain on her relationship to her fiancee. Client reported her fiancee tells her she does not open up to him about how she feels. Client reported she feels like its pointless to tell him because she does not believe he can help her resolve the issues she has going on. Client reported feeling depressed, panic, and difficulty with self care. Client reported a referral has already been put in for her by another provider to speak with a counselor [facility].  ? ? ?Suicidal/Homicidal: Nowithout intent/plan ? ?Therapist Response:  ?Therapist began the appointment discussing confidentiality and making introductions. ?Therapist used CBT to engage and ask the client about precipitating factors that led to her urgent care visit at [facility]. ?Therapist used CBT to ask the client  about her mental health history and external stressors she has been experiencing over the past year. ?Therapist used CBT to discuss follow up appointments. ?  ? ? ? ?Plan: Client reported she has a appointment with [facility] on 08/19/2021 to begin engagement in outpatient counseling.  ? ?Diagnosis: major depressive disorder, recurrent episode, moderate with anxious distress ? ?Collaboration of Care: Other Client reported she needs a doctor note to excuse her from her missed days of work. Therapist informed the client she will need to contact the provider who attended to her at her urgent care visit to discuss a letter stating she is cleared for work. Therapist explained because she is not a established client of a [facility] OP psychiatrist a letter can not be written to excuse her from work. Client was in agreement. ? ?Patient/Guardian was advised Release of Information must be obtained prior to any record release in order to collaborate their care with an outside provider. Patient/Guardian was advised if they have not already done so to contact the registration department to sign all necessary forms in order for [place] to release information regarding their care.  ? ?Consent: Patient/Guardian gives verbal consent for treatment and assignment of benefits for services provided during this visit. Patient/Guardian expressed understanding and agreed to proceed.  ? ?Heather Rhymes Lindwood Mogel, LCSW ?08/17/2021 ? ?

## 2022-04-15 ENCOUNTER — Ambulatory Visit (HOSPITAL_COMMUNITY)
Admission: EM | Admit: 2022-04-15 | Discharge: 2022-04-15 | Discharge status: Against Medical Advice | Payer: Medicaid Other

## 2022-04-15 ENCOUNTER — Encounter (HOSPITAL_BASED_OUTPATIENT_CLINIC_OR_DEPARTMENT_OTHER): Payer: Self-pay | Admitting: Emergency Medicine

## 2022-04-15 ENCOUNTER — Other Ambulatory Visit: Payer: Self-pay

## 2022-04-15 ENCOUNTER — Emergency Department (HOSPITAL_BASED_OUTPATIENT_CLINIC_OR_DEPARTMENT_OTHER)
Admission: EM | Admit: 2022-04-15 | Discharge: 2022-04-15 | Disposition: A | Discharge status: Home / Self Care | Payer: Medicaid Other | Attending: Emergency Medicine | Admitting: Emergency Medicine

## 2022-04-15 DIAGNOSIS — I10 Essential (primary) hypertension: Secondary | ICD-10-CM | POA: Diagnosis not present

## 2022-04-15 DIAGNOSIS — N898 Other specified noninflammatory disorders of vagina: Secondary | ICD-10-CM | POA: Insufficient documentation

## 2022-04-15 DIAGNOSIS — Z79899 Other long term (current) drug therapy: Secondary | ICD-10-CM | POA: Diagnosis not present

## 2022-04-15 LAB — URINALYSIS, ROUTINE W REFLEX MICROSCOPIC
Bilirubin Urine: NEGATIVE
Glucose, UA: NEGATIVE mg/dL
Hgb urine dipstick: NEGATIVE
Ketones, ur: NEGATIVE mg/dL
Leukocytes,Ua: NEGATIVE
Nitrite: NEGATIVE
Protein, ur: NEGATIVE mg/dL
Specific Gravity, Urine: 1.024 (ref 1.005–1.030)
pH: 6.5 (ref 5.0–8.0)

## 2022-04-15 LAB — WET PREP, GENITAL
Trich, Wet Prep: NONE SEEN
WBC, Wet Prep HPF POC: <10 (ref <10)
Yeast Wet Prep HPF POC: NONE SEEN

## 2022-04-15 LAB — PREGNANCY, URINE: Preg Test, Ur: NEGATIVE

## 2022-04-15 MED ORDER — METRONIDAZOLE 500 MG PO TABS: 500.0000 mg | ORAL_TABLET | Freq: Two times a day (BID) | ORAL | 0 refills | Status: DC

## 2022-04-15 MED ORDER — NITROFURANTOIN MONOHYD MACRO 100 MG PO CAPS: 100.0000 mg | ORAL_CAPSULE | Freq: Two times a day (BID) | ORAL | 0 refills | Status: DC

## 2022-04-15 NOTE — ED Triage Notes (Signed)
[facility]ina discharge and urgency for 1 week.

## 2022-04-15 NOTE — ED Provider Notes (Signed)
[facility] EMERGENCY DEPT Provider Note   CSN: [id number] Arrival date & time: 04/15/22  1326     History  Chief Complaint  Patient presents with   Vaginal Discharge    Heather Lynch is a 36 y.o. female.   Vaginal Discharge   36 year old female presents emergency department with complaints of vaginal discharge, urinary frequency/dysuria and foul-smelling discharge for the past week.  Patient was last sexual intercourse occurring approximately 1 month ago with partner who previously had given her trichomoniasis.  Reports brown type vaginal discharge.  Denies any recent medications.  Denies any fever, lower abdominal/pelvic pain, hematuria.  Patient has Mirena IUD in placed and does not have regular menstrual periods.  Denies nausea, vomiting, change in bowel habits.. . Past medical history significant for 6 cesarean sections, urinary tract infection, sepsis secondary to UTI, dilation curettage of uterus, gonorrhea, trichomoniasis  Home Medications Prior to Admission medications   Medication Sig Start Date End Date Taking? Authorizing Provider  metroNIDAZOLE (FLAGYL) 500 MG tablet Take 1 tablet (500 mg total) by mouth 2 (two) times daily. 04/15/22  Yes Sherian Maroon A, PA  nitrofurantoin, macrocrystal-monohydrate, (MACROBID) 100 MG capsule Take 1 capsule (100 mg total) by mouth 2 (two) times daily. 04/15/22  Yes Sherian Maroon A, PA  ELDERBERRY PO Take 3 capsules by mouth daily.    [provider]  LORazepam (ATIVAN) 0.5 MG tablet Take 1 tablet (0.5 mg total) by mouth every 8 (eight) hours as needed for anxiety. 01/09/21   Zadie Rhine, MD      Allergies    Patient has no known allergies.    Review of Systems   Review of Systems  Genitourinary:  Positive for vaginal discharge.  All other systems reviewed and are negative.   Physical Exam Updated Vital Signs BP (!) 138/99 (BP Location: Right Arm)   Pulse 69   Temp 98.2 F (36.8 C) (Oral)    Resp 18   SpO2 95%  Physical Exam Vitals and nursing note reviewed. Exam conducted with a chaperone present.  Constitutional:      General: She is not in acute distress.    Appearance: She is well-developed.  HENT:     Head: Normocephalic and atraumatic.  Eyes:     Conjunctiva/sclera: Conjunctivae normal.  Cardiovascular:     Rate and Rhythm: Normal rate and regular rhythm.     Heart sounds: No murmur heard. Pulmonary:     Effort: Pulmonary effort is normal. No respiratory distress.     Breath sounds: Normal breath sounds.  Abdominal:     Palpations: Abdomen is soft.     Tenderness: There is no abdominal tenderness.  Genitourinary:    Labia:        Right: No rash or lesion.        Left: No rash or lesion.      Vagina: Vaginal discharge present. No tenderness or bleeding.     Cervix: No friability, erythema or cervical bleeding.     Adnexa: Right adnexa normal and left adnexa normal.     Comments: Thin white discharge noticed.  Cervix without abnormality.  No cervical motion tenderness.  No adnexal tenderness. Musculoskeletal:        General: No swelling.     Cervical back: Neck supple.  Skin:    General: Skin is warm and dry.     Capillary Refill: Capillary refill takes less than 2 seconds.  Neurological:     Mental Status: She is alert.  Psychiatric:  Mood and Affect: Mood normal.     ED Results / Procedures / Treatments   Labs (all labs ordered are listed, but only abnormal results are displayed) Labs Reviewed  WET PREP, GENITAL - Abnormal; Notable for the following components:      Result Value   Clue Cells Wet Prep HPF POC PRESENT (*)    All other components within normal limits  URINE CULTURE  URINALYSIS, ROUTINE W REFLEX MICROSCOPIC  PREGNANCY, URINE  RPR  HIV ANTIBODY (ROUTINE TESTING W REFLEX)  GC/CHLAMYDIA PROBE AMP ([place]) NOT [facility]    EKG None  Radiology No results found.  Procedures Procedures    Medications Ordered in  ED Medications - No data to display  ED Course/ Medical Decision Making/ A&P                           Medical Decision Making Amount and/or Complexity of Data Reviewed Labs: ordered.  Risk Prescription drug management.   This patient presents to the ED for concern of vaginal discharge, this involves an extensive number of treatment options, and is a complaint that carries with it a high risk of complications and morbidity.  The differential diagnosis includes trichomoniasis, gonorrhea, chlamydia, HIV, syphilis, pregnancy, menstruation   Co morbidities that complicate the patient evaluation  See HPI   Additional history obtained:  Additional history obtained from EMR External records from outside source obtained and reviewed including prior urine culture with gram-negative E. coli sensitive to Macrobid   Lab Tests:  I Ordered, and personally interpreted labs.  The pertinent results include: UA without abnormalities.  Urine pregnancy negative.  Wet prep significant for clue cells present with negative yeast, trichomoniasis, sperm, WBC.  RPR, HIV, urine culture and GC chlamydia pending.   Imaging Studies ordered:  N/a   Cardiac Monitoring: / EKG:  The patient was maintained on a cardiac monitor.  I personally viewed and interpreted the cardiac monitored which showed an underlying rhythm of: Sinus rhythm   Consultations Obtained:  N/a   Problem List / ED Course / Critical interventions / Medication management  Vaginal discharge Reevaluation of the patient showed that the patient stayed the same I have reviewed the patients home medicines and have made adjustments as needed   Social Determinants of Health:  Obesity.  Former cigarette use.  Denies illicit drug use.   Test / Admission - Considered:  Bacterial vaginosis, urinary frequency Vitals signs significant for mild hypertension with a pressure 136/98.  Recommend close follow-up with PCP regarding mild  elevation of blood pressure.. Otherwise within normal range and stable throughout visit. Laboratory studies significant for: See above Patient elected to leave prior to resulting of wet prep.  She also elected to not be treated at this time prophylactically for potential STI/STD exposure.  Wet prep resulted when she was walking out at the emergency department so she was alerted at least of this result and will be treated accordingly with metronidazole.  Patient has history of negative urinalysis with positive urine culture of moderately resistant E. coli in the past with similar symptoms as a listening today.  We will treat empirically with Macrobid given susceptibility of prior urine culture.  Recommend following GC/chlamydia a test results for potential future treatment.  Recommend follow-up with OB/GYN for reevaluation in 3 to 5 days.  Patient recommended sexual abstinence in the midst of potential GC/committee infection.  Treatment plan discussed at length with patient she acknowledged  understand was agreeable to said plan. Worrisome signs and symptoms were discussed with the patient, and the patient acknowledged understanding to return to the ED if noticed. Patient was stable upon discharge.          Final Clinical Impression(s) / ED Diagnoses Final diagnoses:  Vaginal discharge    Rx / DC Orders ED Discharge Orders          Ordered    metroNIDAZOLE (FLAGYL) 500 MG tablet  2 times daily        04/15/22 1818    nitrofurantoin, macrocrystal-monohydrate, (MACROBID) 100 MG capsule  2 times daily        04/15/22 1819              Wilnette Kales, [place] 04/15/22 Georgiann Mccoy, MD 04/16/22 0005

## 2022-04-15 NOTE — ED Notes (Signed)
Discharge instructions and follow up care reviewed and explained, pt verbalized understanding and had no further questions on d/c. Pt caox4 and ambulatory on d/c.  

## 2022-04-15 NOTE — Discharge Instructions (Signed)
Follow MyChart for the results of your test performed in the emergency department.  Follow-up with your OB/GYN/PCP for further treatment.  Follow-up with your OB/GYN for reevaluation of your symptoms in 3 to 5 days.  Please not hesitate to return to the emergency department if the worrisome signs symptoms we discussed become apparent.

## 2022-04-16 LAB — HIV ANTIBODY (ROUTINE TESTING W REFLEX): HIV Screen 4th Generation wRfx: NONREACTIVE

## 2022-04-16 LAB — RPR: RPR Ser Ql: NONREACTIVE

## 2022-04-17 LAB — GC/CHLAMYDIA PROBE AMP (~~LOC~~) NOT AT ARMC
Chlamydia: NEGATIVE
Comment: NEGATIVE
Comment: NORMAL
Neisseria Gonorrhea: NEGATIVE

## 2022-04-17 LAB — URINE CULTURE: Culture: NO GROWTH

## 2022-06-13 ENCOUNTER — Ambulatory Visit (HOSPITAL_COMMUNITY)
Admission: EM | Admit: 2022-06-13 | Discharge: 2022-06-13 | Disposition: A | Discharge status: Home / Self Care | Payer: Medicaid Other | Attending: Physician Assistant | Admitting: Physician Assistant

## 2022-06-13 ENCOUNTER — Ambulatory Visit (INDEPENDENT_AMBULATORY_CARE_PROVIDER_SITE_OTHER)
Admission: EM | Admit: 2022-06-13 | Discharge: 2022-06-13 | Disposition: A | Discharge status: Home / Self Care | Payer: Medicaid Other | Source: Home / Self Care

## 2022-06-13 VITALS — BP 114/71 | HR 75 | Temp 98.0°F | Resp 16

## 2022-06-13 DIAGNOSIS — Z3202 Encounter for pregnancy test, result negative: Secondary | ICD-10-CM | POA: Diagnosis not present

## 2022-06-13 DIAGNOSIS — N760 Acute vaginitis: Secondary | ICD-10-CM

## 2022-06-13 DIAGNOSIS — B3731 Acute candidiasis of vulva and vagina: Secondary | ICD-10-CM | POA: Diagnosis not present

## 2022-06-13 DIAGNOSIS — J0110 Acute frontal sinusitis, unspecified: Secondary | ICD-10-CM | POA: Insufficient documentation

## 2022-06-13 LAB — POCT URINALYSIS DIP (MANUAL ENTRY)
Bilirubin, UA: NEGATIVE
Glucose, UA: NEGATIVE mg/dL
Ketones, POC UA: NEGATIVE mg/dL
Leukocytes, UA: NEGATIVE
Nitrite, UA: NEGATIVE
Protein Ur, POC: NEGATIVE mg/dL
Spec Grav, UA: >=1.03 — AB (ref 1.010–1.025)
Urobilinogen, UA: 0.2 E.U./dL
pH, UA: 6 (ref 5.0–8.0)

## 2022-06-13 LAB — POCT URINE PREGNANCY: Preg Test, Ur: NEGATIVE

## 2022-06-13 MED ORDER — AMOXICILLIN-POT CLAVULANATE 875-125 MG PO TABS: 1.0000 | ORAL_TABLET | Freq: Two times a day (BID) | ORAL | 0 refills | Status: DC

## 2022-06-13 MED ORDER — FLUCONAZOLE 150 MG PO TABS: ORAL_TABLET | ORAL | 0 refills | Status: DC

## 2022-06-13 NOTE — ED Triage Notes (Signed)
Headache starting yesterday afternoon. Got worse this morning around 0615. States she feels weakness is both her arms. Also reports hearing a "swooshing" type noise every time she bends over. Also concerned about new vaginal discharge

## 2022-06-13 NOTE — ED Provider Notes (Signed)
[name]    CSN: [id number] Arrival date & time: 06/13/22  1431      History   Chief Complaint Chief Complaint  Patient presents with   Headache    Not sure what all is going on just feel like something not right. - Entered by patient    HPI Heather Lynch is a 37 y.o. female.   Patient here today for evaluation of frontal headache that started yesterday. She has had some swooshing sound in her ears when she bends over as well. She denies any significant sinus pressure at this time but her son did have flu a week or so ago.   She also reports some vaginal discharge that varies in color from white to yellow. She is concerned with possible UTI. She denies any dysuria. She does report some itching to her vaginal area at times.   The history is provided by the patient.  Headache Associated symptoms: no fever, no nausea and no vomiting     Past Medical History:  Diagnosis Date   Anemia    Depression    refuses to take meds   Gonorrhea    Heart murmur    Pneumonia    Preterm delivery 10/20/2018   Spontaneous abortion 10/20/2018   Urinary tract infection     Patient Active Problem List   Diagnosis Date Noted   Melena 04/12/2019   Abdominal pain 04/12/2019   Vulvovaginal candidiasis 04/12/2019   Leucocytosis 12/26/2018   Acute pyelonephritis 12/26/2018   Sepsis due to gram-negative UTI ([place]) 12/26/2018    Past Surgical History:  Procedure Laterality Date   CESAREAN SECTION     X 5   DILATION AND CURETTAGE OF UTERUS N/A 10/20/2018   Procedure: DILATATION AND CURETTAGE;  Surgeon: Woodroe Mode, MD;  Location: [facility] LD ORS;  Service: Gynecology;  Laterality: N/A;    OB History     Gravida  9   Para  8   Term  6   Preterm  1   AB      Living  7      SAB  0   IAB      Ectopic      Multiple  0   Live Births  7            Home Medications    Prior to Admission medications   Medication Sig Start Date End Date Taking?  Authorizing Provider  amoxicillin-clavulanate (AUGMENTIN) 875-125 MG tablet Take 1 tablet by mouth every 12 (twelve) hours. 06/13/22  Yes Francene Finders, PA-C  fluconazole (DIFLUCAN) 150 MG tablet Take one tab PO today and repeat dose in 3 days if symptoms persist 06/13/22  Yes Francene Finders, PA-C  ELDERBERRY PO Take 3 capsules by mouth daily.    [provider]  LORazepam (ATIVAN) 0.5 MG tablet Take 1 tablet (0.5 mg total) by mouth every 8 (eight) hours as needed for anxiety. 01/09/21   Ripley Fraise, MD  metroNIDAZOLE (FLAGYL) 500 MG tablet Take 1 tablet (500 mg total) by mouth 2 (two) times daily. 04/15/22   Wilnette Kales, PA  nitrofurantoin, macrocrystal-monohydrate, (MACROBID) 100 MG capsule Take 1 capsule (100 mg total) by mouth 2 (two) times daily. 04/15/22   Wilnette Kales, PA    Family History Family History  Problem Relation Age of Onset   Hypertension Other    Other Neg Hx     Social History Social History   Tobacco Use  Smoking status: Former    Types: Cigarettes    Quit date: 12/30/2011    Years since quitting: 10.4   Smokeless tobacco: Never  Vaping Use   Vaping Use: Never used  Substance Use Topics   Alcohol use: Yes    Comment: occasional   Drug use: Yes    Types: Marijuana    Comment: LAST SMOKED - NOV     Allergies   Patient has no known allergies.   Review of Systems Review of Systems  Constitutional:  Negative for chills and fever.  Eyes:  Negative for discharge and redness.  Respiratory:  Negative for shortness of breath.   Gastrointestinal:  Negative for nausea and vomiting.  Genitourinary:  Positive for vaginal discharge. Negative for dysuria.  Neurological:  Positive for headaches.     Physical Exam Triage Vital Signs ED Triage Vitals [06/13/22 1441]  Enc Vitals Group     BP 114/71     Pulse Rate 75     Resp 16     Temp 98 F (36.7 C)     Temp Source Oral     SpO2 98 %     Weight      Height      Head  Circumference      Peak Flow      Pain Score 4     Pain Loc      Pain Edu?      Excl. in GC?    No data found.  Updated Vital Signs BP 114/71 (BP Location: Left Arm)   Pulse 75   Temp 98 F (36.7 C) (Oral)   Resp 16   SpO2 98%      Physical Exam Vitals and nursing note reviewed.  Constitutional:      General: She is not in acute distress.    Appearance: She is well-developed. She is not ill-appearing.  HENT:     Head: Normocephalic and atraumatic.     Nose: Nose normal. No congestion or rhinorrhea.     Mouth/Throat:     Mouth: Mucous membranes are moist.     Pharynx: Oropharynx is clear.  Eyes:     Conjunctiva/sclera: Conjunctivae normal.  Cardiovascular:     Rate and Rhythm: Normal rate and regular rhythm.  Pulmonary:     Effort: Pulmonary effort is normal. No respiratory distress.     Breath sounds: Normal breath sounds. No wheezing, rhonchi or rales.  Neurological:     Mental Status: She is alert.  Psychiatric:        Mood and Affect: Mood normal.        Behavior: Behavior normal.      UC Treatments / Results  Labs (all labs ordered are listed, but only abnormal results are displayed) Labs Reviewed  POCT URINALYSIS DIP (MANUAL ENTRY) - Abnormal; Notable for the following components:      Result Value   Spec Grav, UA >=1.030 (*)    Blood, UA trace-lysed (*)    All other components within normal limits  POCT URINE PREGNANCY  CERVICOVAGINAL ANCILLARY ONLY    EKG   Radiology No results found.  Procedures Procedures (including critical care time)  Medications Ordered in UC Medications - No data to display  Initial Impression / Assessment and Plan / UC Course  I have reviewed the triage vital signs and the nursing notes.  Pertinent labs & imaging results that were available during my care of the patient were reviewed by me and considered in  my medical decision making (see chart for details).    Will treat to cover sinusitis given reported  headache, ear concerns. UA without findings concerning for UTI. Will order cervicovaginal swab for screening for yeast and BV as well as GC, chlamydia and trich. Will await results for further recommendation but given reported itching and now antibiotic therapy will also prescribe diflucan. Recommended follow up if no gradual improvement of symptoms or sooner with any worsening.   Final Clinical Impressions(s) / UC Diagnoses   Final diagnoses:  Acute frontal sinusitis, recurrence not specified  Acute vaginitis   Discharge Instructions   None    ED Prescriptions     Medication Sig Dispense Auth. Provider   amoxicillin-clavulanate (AUGMENTIN) 875-125 MG tablet Take 1 tablet by mouth every 12 (twelve) hours. 14 tablet Ewell Poe F, PA-C   fluconazole (DIFLUCAN) 150 MG tablet Take one tab PO today and repeat dose in 3 days if symptoms persist 2 tablet Francene Finders, PA-C      PDMP not reviewed this encounter.   Francene Finders, PA-C 06/13/22 1614

## 2022-06-14 LAB — CERVICOVAGINAL ANCILLARY ONLY
Bacterial Vaginitis (gardnerella): POSITIVE — AB
Candida Glabrata: NEGATIVE
Candida Vaginitis: POSITIVE — AB
Chlamydia: NEGATIVE
Comment: NEGATIVE
Comment: NEGATIVE
Comment: NEGATIVE
Comment: NEGATIVE
Comment: NEGATIVE
Comment: NORMAL
Neisseria Gonorrhea: NEGATIVE
Trichomonas: NEGATIVE

## 2022-06-15 ENCOUNTER — Telehealth (HOSPITAL_COMMUNITY): Payer: Self-pay | Admitting: Emergency Medicine

## 2022-06-15 MED ORDER — METRONIDAZOLE 500 MG PO TABS: 500.0000 mg | ORAL_TABLET | Freq: Two times a day (BID) | ORAL | 0 refills | Status: DC

## 2022-09-08 IMAGING — US US EXTREM LOW VENOUS*L*
1 series · 14 of 24 positions shown · non-contrast
Comparison: None.

CLINICAL DATA: Leg pain and swelling

EXAM:
Left LOWER EXTREMITY VENOUS DOPPLER ULTRASOUND
TECHNIQUE: Gray-scale sonography with compression, as well as color and duplex
ultrasound, were performed to evaluate the deep venous system(s)
from the level of the common femoral vein through the popliteal and
proximal calf veins.

[Series 1: us venous img lower uni left (dvt) · portal-venous · 41 acquisitions, 14 frames shown]
[im 1/41]
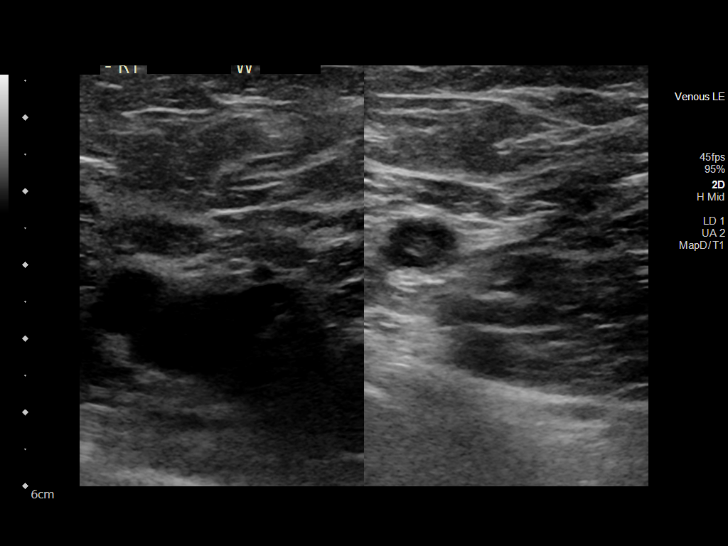
[im 4/41]
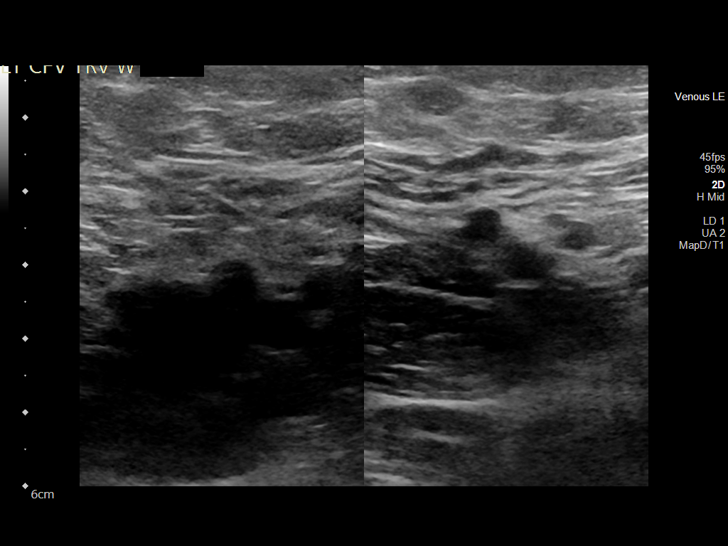
[im 7/41]
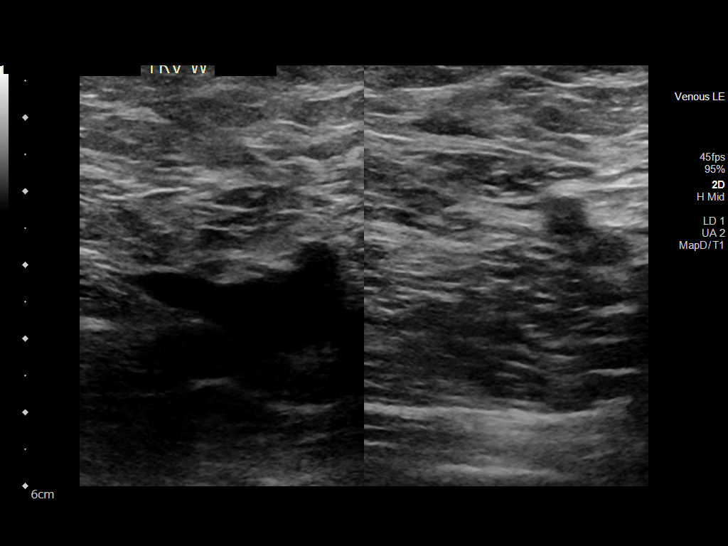
[im 11/41]
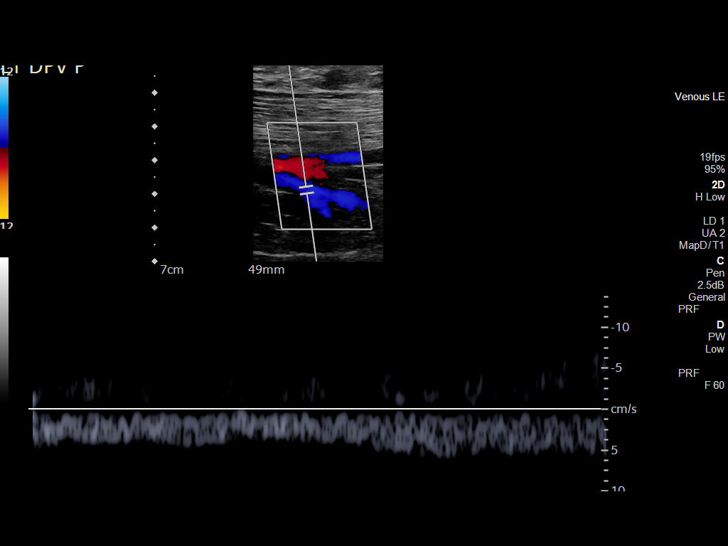
[im 13/41]
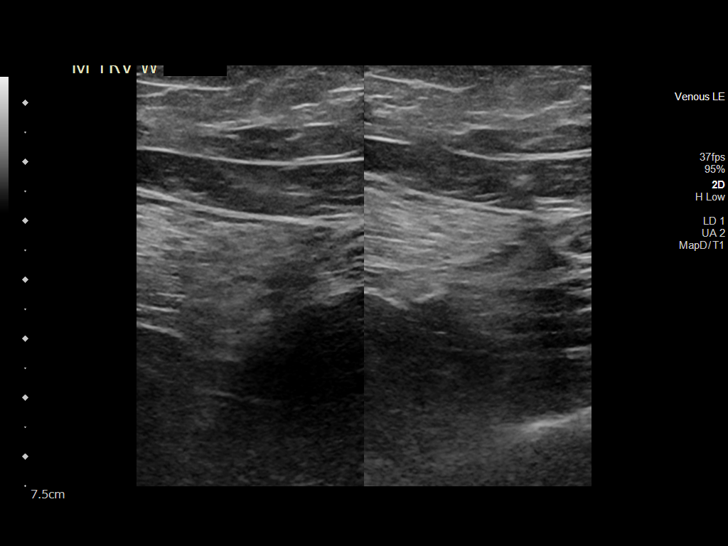
[im 16/41]
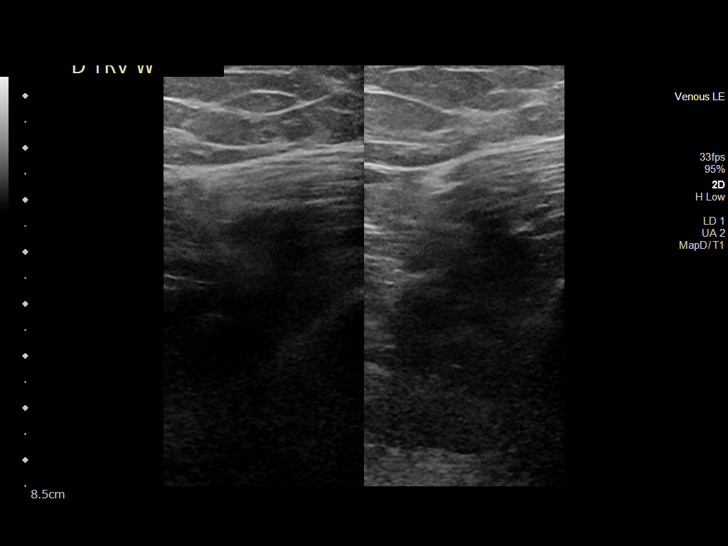
[im 20/41]
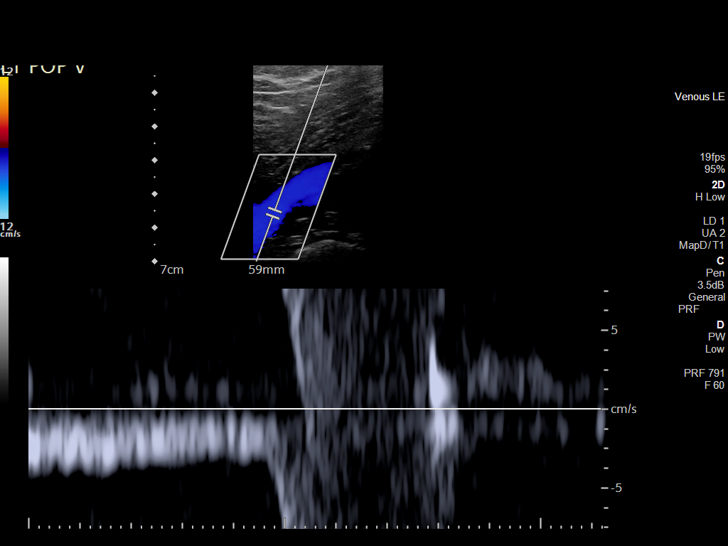
[im 23/41]
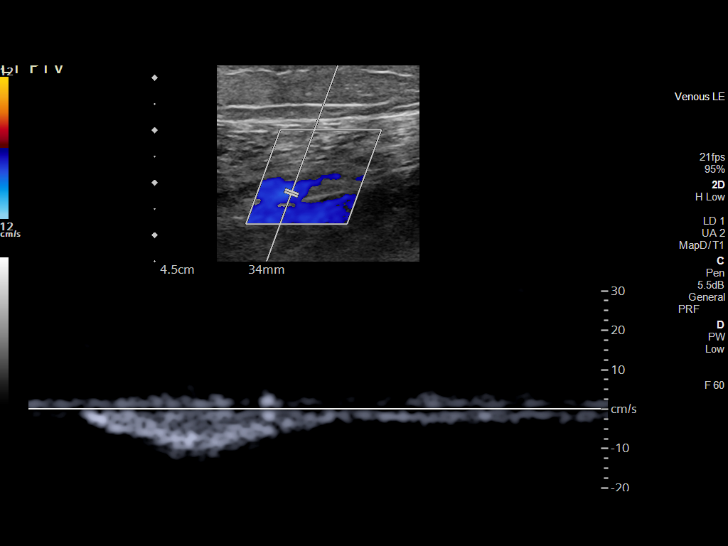
[im 27/41]
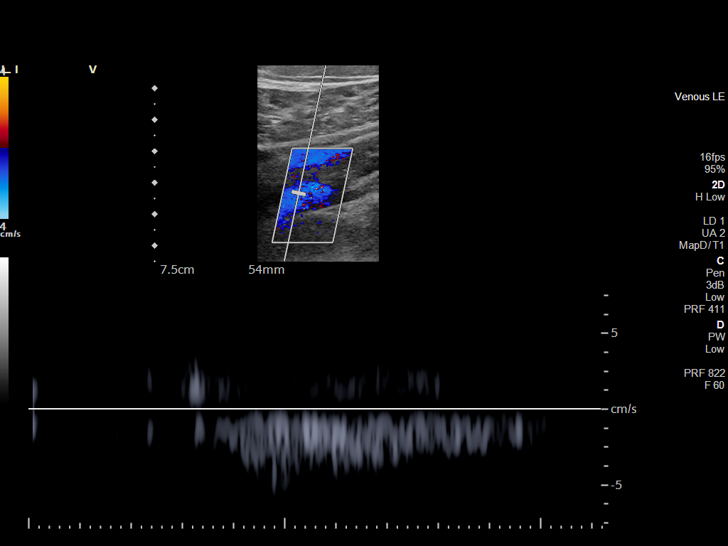
[im 30/41]
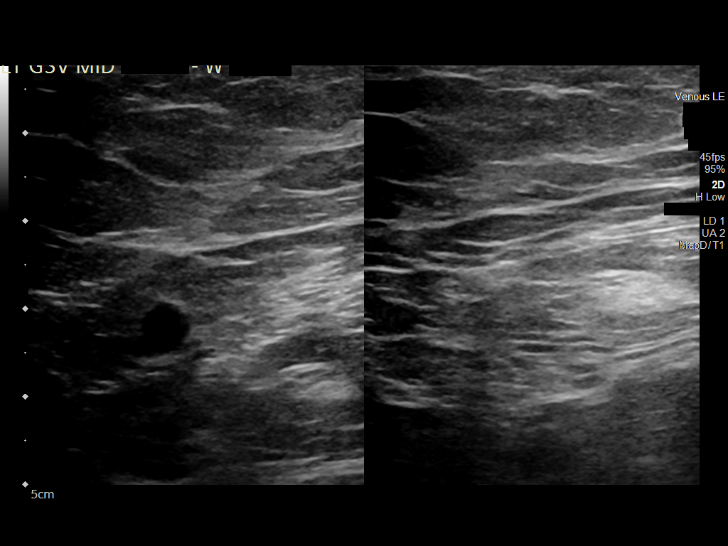
[im 34/41]
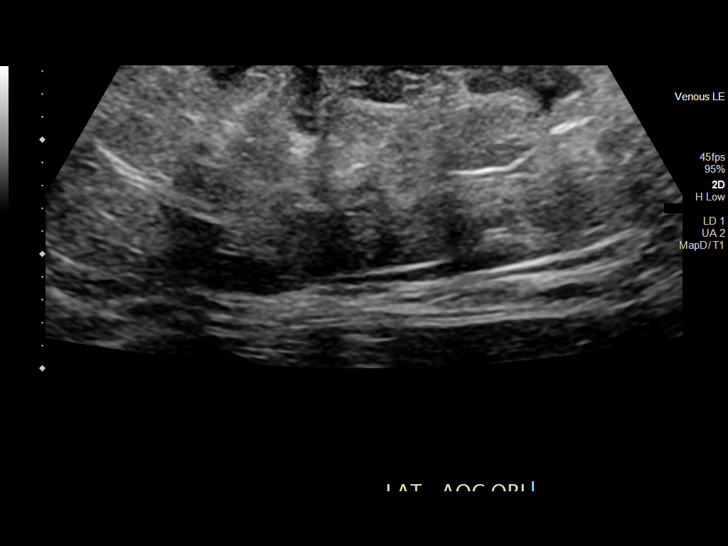
[im 35/41]
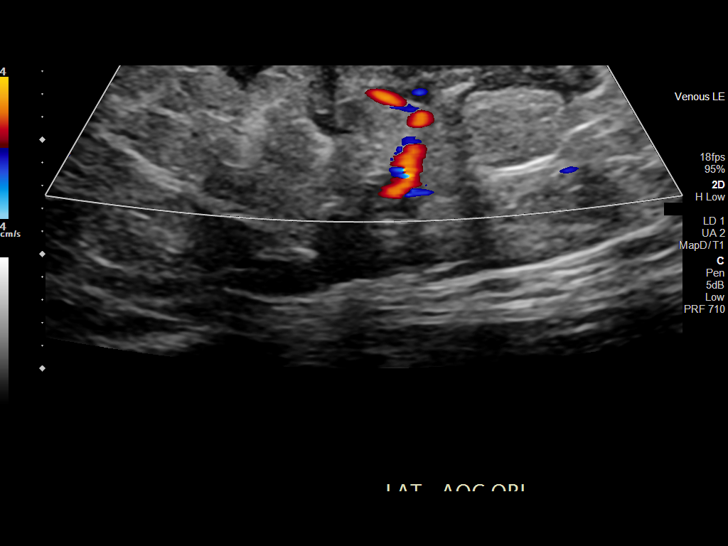
[im 37/41]
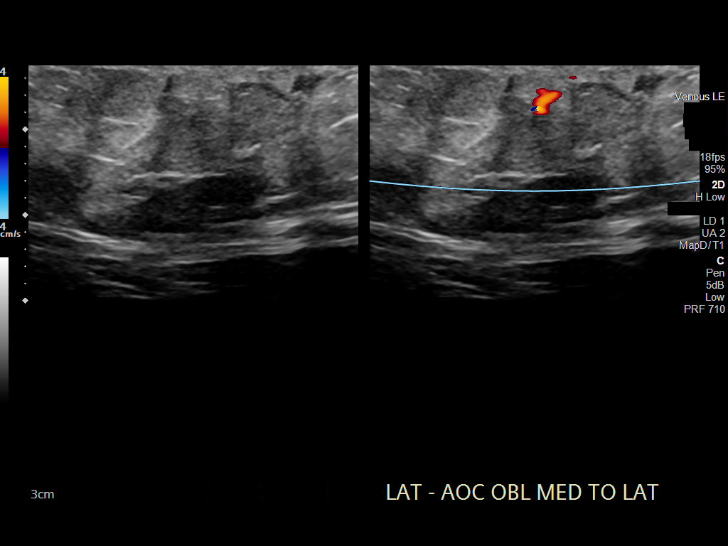
[im 41/41]
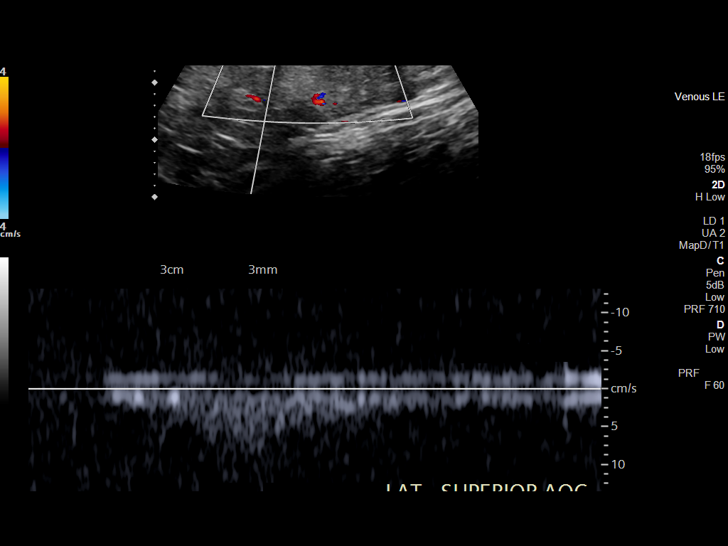

[14 of 24 positions shown; findings below may reference images not displayed]

FINDINGS: VENOUS

Normal compressibility of the common femoral, superficial femoral,
and popliteal veins, as well as the visualized calf veins.
Visualized portions of profunda femoral vein and great saphenous
vein unremarkable. No filling defects to suggest DVT on grayscale or
color Doppler imaging. Doppler waveforms show normal direction of
venous flow, normal respiratory plasticity and response to
augmentation.

Limited views of the contralateral common femoral vein are
unremarkable.

OTHER

Possible thrombosis of superficial venous varicosity in the upper
calf

Limitations: none
IMPRESSION: 1. Negative for acute DVT.
2. Possible focal thrombosis of superficial venous varicosity at the
upper calf.

## 2022-09-10 ENCOUNTER — Ambulatory Visit: Payer: Self-pay

## 2022-10-16 ENCOUNTER — Ambulatory Visit
Admission: EM | Admit: 2022-10-16 | Discharge: 2022-10-16 | Disposition: A | Discharge status: Home / Self Care | Payer: Medicaid Other | Attending: Emergency Medicine | Admitting: Emergency Medicine

## 2022-10-16 DIAGNOSIS — N898 Other specified noninflammatory disorders of vagina: Secondary | ICD-10-CM | POA: Diagnosis present

## 2022-10-16 MED ORDER — FLUCONAZOLE 150 MG PO TABS: 150.0000 mg | ORAL_TABLET | Freq: Every day | ORAL | 0 refills | Status: AC

## 2022-10-16 MED ORDER — METRONIDAZOLE 500 MG PO TABS: 500.0000 mg | ORAL_TABLET | Freq: Two times a day (BID) | ORAL | 0 refills | Status: DC

## 2022-10-16 NOTE — ED Provider Notes (Signed)
[name]    CSN: [id number] Arrival date & time: 10/16/22  1704      History   Chief Complaint No chief complaint on file.   HPI Heather Lynch is a 37 y.o. female.   Patient presents for evaluation of vaginal discharge with odor that began over the last week.  Recently treated for bacterial vaginosis and yeast 1 month ago, completed all medication and symptoms did resolve.  Endorses that ever since Mirena was placed 3 years ago she has had reoccurring vaginal infections, also believes possibly related to ejaculation of her partner therefore she has started using condoms, recently unable to afford normal dose open has been using Olay, several possible triggers unsure exactly which is the cause.  Denies urinary symptoms, lower abdominal pain or pressure, back pain . Active, 1 partner.  Past Medical History:  Diagnosis Date   Anemia    Depression    refuses to take meds   Gonorrhea    Heart murmur    Pneumonia    Preterm delivery 10/20/2018   Spontaneous abortion 10/20/2018   Urinary tract infection     Patient Active Problem List   Diagnosis Date Noted   Melena 04/12/2019   Abdominal pain 04/12/2019   Vulvovaginal candidiasis 04/12/2019   Leucocytosis 12/26/2018   Acute pyelonephritis 12/26/2018   Sepsis due to gram-negative UTI ([facility]) 12/26/2018    Past Surgical History:  Procedure Laterality Date   CESAREAN SECTION     X 5   DILATION AND CURETTAGE OF UTERUS N/A 10/20/2018   Procedure: DILATATION AND CURETTAGE;  Surgeon: Adam Phenix, MD;  Location: [facility] LD ORS;  Service: Gynecology;  Laterality: N/A;    OB History     Gravida  9   Para  8   Term  6   Preterm  1   AB      Living  7      SAB  0   IAB      Ectopic      Multiple  0   Live Births  7            Home Medications    Prior to Admission medications   Medication Sig Start Date End Date Taking? Authorizing Provider  amoxicillin-clavulanate (AUGMENTIN) 875-125  MG tablet Take 1 tablet by mouth every 12 (twelve) hours. 06/13/22   Tomi Bamberger, PA-C  ELDERBERRY PO Take 3 capsules by mouth daily.    [provider]  fluconazole (DIFLUCAN) 150 MG tablet Take one tab PO today and repeat dose in 3 days if symptoms persist 06/13/22   Tomi Bamberger, PA-C  LORazepam (ATIVAN) 0.5 MG tablet Take 1 tablet (0.5 mg total) by mouth every 8 (eight) hours as needed for anxiety. 01/09/21   Zadie Rhine, MD  metroNIDAZOLE (FLAGYL) 500 MG tablet Take 1 tablet (500 mg total) by mouth 2 (two) times daily. 06/15/22   LampteyBritta Mccreedy, MD    Family History Family History  Problem Relation Age of Onset   Hypertension Other    Other Neg Hx     Social History Social History   Tobacco Use   Smoking status: Former    Types: Cigarettes    Quit date: 12/30/2011    Years since quitting: 10.8   Smokeless tobacco: Never  Vaping Use   Vaping Use: Never used  Substance Use Topics   Alcohol use: Yes    Comment: occasional   Drug use: Yes  Types: Marijuana    Comment: LAST SMOKED - NOV     Allergies   Patient has no known allergies.   Review of Systems Review of Systems   Physical Exam Triage Vital Signs ED Triage Vitals  Enc Vitals Group     BP 10/16/22 1734 (!) 152/90     Pulse Rate 10/16/22 1734 70     Resp 10/16/22 1734 18     Temp 10/16/22 1734 98.5 F (36.9 C)     Temp src --      SpO2 10/16/22 1734 98 %     Weight --      Height --      Head Circumference --      Peak Flow --      Pain Score 10/16/22 1742 0     Pain Loc --      Pain Edu? --      Excl. in GC? --    No data found.  Updated Vital Signs BP (!) 152/90 (BP Location: Left Wrist)   Pulse 70   Temp 98.5 F (36.9 C)   Resp 18   LMP  (LMP Unknown)   SpO2 98%   Visual Acuity Right Eye Distance:   Left Eye Distance:   Bilateral Distance:    Right Eye Near:   Left Eye Near:    Bilateral Near:     Physical Exam Constitutional:      Appearance: Normal  appearance.  Eyes:     Extraocular Movements: Extraocular movements intact.  Pulmonary:     Effort: Pulmonary effort is normal.  Genitourinary:    Comments: deferred Skin:    General: Skin is warm and dry.  Neurological:     Mental Status: She is alert and oriented to person, place, and time. Mental status is at baseline.      UC Treatments / Results  Labs (all labs ordered are listed, but only abnormal results are displayed) Labs Reviewed  CERVICOVAGINAL ANCILLARY ONLY    EKG   Radiology No results found.  Procedures Procedures (including critical care time)  Medications Ordered in UC Medications - No data to display  Initial Impression / Assessment and Plan / UC Course  I have reviewed the triage vital signs and the nursing notes.  Pertinent labs & imaging results that were available during my care of the patient were reviewed by me and considered in my medical decision making (see chart for details).  Vaginal discharge   Prophylactically for bacterial vaginosis and yeast based on reoccurring history, metronidazole and Diflucan prescribed, discussed administration, advised against any alcohol use during treatment, STI labs pending will treat per protocol, advised abstinence until lab results, and/or treatment is complete, advised condom use during all sexual encounters moving, may follow-up with urgent care as needed  Final Clinical Impressions(s) / UC Diagnoses   Final diagnoses:  None   Discharge Instructions   None    ED Prescriptions   None    PDMP not reviewed this encounter.   Valinda Hoar, [place] 10/16/22 [phone]

## 2022-10-16 NOTE — Discharge Instructions (Signed)
Today you are being treated prophylactically for  Bacterial vaginosis and yeast  Take Metronidazole 500 mg twice a day for 7 days, do not drink alcohol while using medication, this will make you feel sick   Take 1 Diflucan tablet today then in 8 days take second dose after completion of antibiotic  Bacterial vaginosis and yeast which results from an overgrowth of one on several organisms that are normally present in your vagina. Vaginosis is an inflammation of the vagina that can result in discharge, itching and pain.  Labs pending 2-3 days, you will be contacted if positive for any sti and treatment will be sent to the pharmacy, you will have to return to the clinic if positive for gonorrhea to receive treatment   Please refrain from having sex until labs results, if positive please refrain from having sex until treatment complete and symptoms resolve   If positive for , Chlamydia  gonorrhea or trichomoniasis please notify partner or partners so they may tested as well  Moving forward, it is recommended you use some form of protection against the transmission of sti infections  such as condoms or dental dams with each sexual encounter     In addition: Avoid baths, hot tubs and whirlpool spas.  Don't use scented or harsh soaps Avoid irritants. These include scented tampons and pads. Wipe from front to back after using the toilet. Don't douche. Your vagina doesn't require cleansing other than normal bathing.  Use a condom.  Wear cotton underwear, this fabric absorbs some moisture.

## 2022-10-16 NOTE — ED Triage Notes (Signed)
Pt reports she is still having a foul vaginal odor and discharge x 1 month. Pt was prescribed flagyl and diflucan after testing positive for BV.

## 2022-10-17 LAB — CERVICOVAGINAL ANCILLARY ONLY
Bacterial Vaginitis (gardnerella): NEGATIVE
Candida Glabrata: NEGATIVE
Candida Vaginitis: NEGATIVE
Chlamydia: NEGATIVE
Comment: NEGATIVE
Comment: NEGATIVE
Comment: NEGATIVE
Comment: NEGATIVE
Comment: NEGATIVE
Comment: NORMAL
Neisseria Gonorrhea: NEGATIVE
Trichomonas: NEGATIVE

## 2022-12-13 ENCOUNTER — Encounter (HOSPITAL_COMMUNITY): Payer: Self-pay

## 2022-12-13 ENCOUNTER — Ambulatory Visit (HOSPITAL_COMMUNITY)
Admission: EM | Admit: 2022-12-13 | Discharge: 2022-12-13 | Disposition: A | Discharge status: Home / Self Care | Payer: Medicaid Other | Attending: Physician Assistant | Admitting: Physician Assistant

## 2022-12-13 ENCOUNTER — Ambulatory Visit (INDEPENDENT_AMBULATORY_CARE_PROVIDER_SITE_OTHER): Payer: Medicaid Other

## 2022-12-13 DIAGNOSIS — M25571 Pain in right ankle and joints of right foot: Secondary | ICD-10-CM | POA: Diagnosis not present

## 2022-12-13 DIAGNOSIS — N898 Other specified noninflammatory disorders of vagina: Secondary | ICD-10-CM | POA: Insufficient documentation

## 2022-12-13 DIAGNOSIS — M79671 Pain in right foot: Secondary | ICD-10-CM | POA: Insufficient documentation

## 2022-12-13 MED ORDER — IBUPROFEN 600 MG PO TABS: 600.0000 mg | ORAL_TABLET | Freq: Three times a day (TID) | ORAL | 0 refills | Status: AC | PRN

## 2022-12-13 MED ORDER — METRONIDAZOLE 500 MG PO TABS: 500.0000 mg | ORAL_TABLET | Freq: Two times a day (BID) | ORAL | 0 refills | Status: AC

## 2022-12-13 MED ORDER — IBUPROFEN 800 MG PO TABS
800.0000 mg | ORAL_TABLET | Freq: Once | ORAL | Status: AC
  Administered 2022-12-13: 800 mg via ORAL

## 2022-12-13 MED ORDER — FLUCONAZOLE 150 MG PO TABS: 150.0000 mg | ORAL_TABLET | Freq: Once | ORAL | 0 refills | Status: AC

## 2022-12-13 MED ORDER — IBUPROFEN 800 MG PO TABS
ORAL_TABLET | ORAL | Status: AC
  Filled 2022-12-13: qty 1

## 2022-12-13 NOTE — ED Triage Notes (Signed)
Pt c/o rt foot pain and swelling x2 days with hx of same. Denies injury. Denies taking any meds.   Pt states having thick vaginal discharge with odor x1wk. States has had BV x4 since she got her IUD placed.
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
## 2022-12-13 NOTE — Discharge Instructions (Addendum)
Your x-ray showed some degenerative (arthritis) changes.  This could be contributing to your pain.  Take ibuprofen for pain relief.  Do not take NSAIDs with this medication including aspirin, ibuprofen/Advil, naproxen/Aleve.  You can use Tylenol/acetaminophen for additional symptom relief.  Use the brace for comfort and support.  Keep your leg elevated and use ice as needed.  Follow-up with podiatry.  Call them to schedule an appointment.  If anything worsens please return for reevaluation.  We are treating you for bacterial vaginosis.  I have called in 1 Diflucan if needed.  Wear loosefitting cotton underwear and use hypoallergenic soaps and detergents.  If anything changes or worsens please return for reevaluation.  Follow-up with your OB/GYN as we discussed.

## 2023-02-12 ENCOUNTER — Encounter: Payer: Self-pay | Admitting: *Deleted

## 2023-02-12 NOTE — Progress Notes (Addendum)
Pt attended 01/11/23 screening event where her b/p was 126/89. At the event, the pt listed a [place] address and noted her PCP was in [facility]; pt also identified a transportation insecurity but her chart review indicates pt has [employer] which usually provides transportation to healthcare appts. Calls made x 3 to pt to f/u on local PCP needs and transportation concerns. Chart review notes pt has used [facility] facilities through 8/23 and Urgent care and emergency room support in the [place] area since 11/23. Additional chart review indicates pt has sought care at the [facility] location 10/16/22. Since health equity team member unable to reach pt by phone to determine current PCP, insurance, and SDOH status, letter sent to pt with [facility] and [facility] clinic flyers as well as Medicaid expansion eligibility into and [facility] financial assistance info and transportation resources. [place] primary care highlighted for pt in case that location was convenient for her to access, per her 5/24 UC choice.for care. Additional pt f/u to be scheduled per health equity protocol. 02/24/23 Letter returned. Dorie Rank, RN, 5:57 PM

## 2023-04-01 ENCOUNTER — Encounter: Payer: Self-pay | Admitting: *Deleted

## 2023-04-01 NOTE — Progress Notes (Addendum)
Pt attended 01/11/23 screening event where her b/p was 126/89 and pt identified transportation as an insecurity (despite her Medicaid insurance options). Chart review indicates last [facility]-visible care connection pt had in 2024 was [facility] urgent care and [facility] urgent care. Since health equity team member unable to contact pt by phone during initial event f/u, letter was sent to her with PCP info. However, that letter was returned and during today's 60 day event f/u, pt's phone responds that the call cannot be completed as dialed. Chart review still does not indicate that pt has sought care in any [facility]-visible encounter. Since pt's phone and address are no longer valid, unable to leave vm or send additional healthcare access contact info. Additional pt f/u to be scheduled per health equity protocol to try again to contact pt.

## 2023-08-05 ENCOUNTER — Encounter: Payer: Self-pay | Admitting: *Deleted

## 2023-08-05 NOTE — Progress Notes (Signed)
 Pt attended 01/11/23 screening event where her b/p was 126/89 and pt identified transportation as an insecurity, did not list any insurance, noted not being a smoker, and listed PCP as being in [place]. Chart review indicates last [facility]-visible care connection pt had in 2024 was [facility] urgent care and [facility] urgent care. Since health equity team member unable to contact pt by phone during initial event f/u, letter was sent to her with PCP info. However, that letter was returned and  pt's phone responded that the call cannot be completed as dialed. During today's 6 month event f/u, phone number was no longer in service.Current chart review still does not indicate that pt has sought care in any [facility]-visible encounter. Since unable to reach pt by phone and last known address outreach letter was returned, no additional health equity team support scheduled at this time.
